# Patient Record
Sex: Female | Born: 1935 | Race: White | Hispanic: No | Smoking: Former smoker
Health system: Southern US, Community
[De-identification: ages and names within clinical notes are randomized; demographics above are authoritative.]

## PROBLEM LIST (undated history)

## (undated) DIAGNOSIS — M1990 Unspecified osteoarthritis, unspecified site: Secondary | ICD-10-CM

## (undated) DIAGNOSIS — B0229 Other postherpetic nervous system involvement: Secondary | ICD-10-CM

## (undated) HISTORY — DX: Other postherpetic nervous system involvement: B02.29

## (undated) HISTORY — DX: Unspecified osteoarthritis, unspecified site: M19.90

---

## 1978-02-27 HISTORY — PX: ABDOMINAL HYSTERECTOMY: SHX81

## 1998-10-15 ENCOUNTER — Other Ambulatory Visit: Admission: RE | Admit: 1998-10-15 | Discharge: 1998-10-15 | Payer: Self-pay | Admitting: Obstetrics and Gynecology

## 1999-11-14 ENCOUNTER — Other Ambulatory Visit: Admission: RE | Admit: 1999-11-14 | Discharge: 1999-11-14 | Payer: Self-pay | Admitting: Obstetrics and Gynecology

## 2000-12-18 ENCOUNTER — Other Ambulatory Visit: Admission: RE | Admit: 2000-12-18 | Discharge: 2000-12-18 | Payer: Self-pay | Admitting: Obstetrics and Gynecology

## 2003-09-17 ENCOUNTER — Ambulatory Visit (HOSPITAL_COMMUNITY): Admission: RE | Admit: 2003-09-17 | Discharge: 2003-09-17 | Payer: Self-pay | Admitting: Family Medicine

## 2011-04-17 ENCOUNTER — Ambulatory Visit (INDEPENDENT_AMBULATORY_CARE_PROVIDER_SITE_OTHER): Payer: Medicare Other | Admitting: Family Medicine

## 2011-04-17 ENCOUNTER — Encounter: Payer: Self-pay | Admitting: Family Medicine

## 2011-04-17 VITALS — BP 149/81 | HR 67 | Temp 98.6°F | Ht 67.0 in | Wt 170.5 lb

## 2011-04-17 DIAGNOSIS — B0229 Other postherpetic nervous system involvement: Secondary | ICD-10-CM

## 2011-04-17 DIAGNOSIS — Z299 Encounter for prophylactic measures, unspecified: Secondary | ICD-10-CM

## 2011-04-17 DIAGNOSIS — B351 Tinea unguium: Secondary | ICD-10-CM | POA: Insufficient documentation

## 2011-04-17 DIAGNOSIS — Z0000 Encounter for general adult medical examination without abnormal findings: Secondary | ICD-10-CM | POA: Insufficient documentation

## 2011-04-17 DIAGNOSIS — M722 Plantar fascial fibromatosis: Secondary | ICD-10-CM | POA: Insufficient documentation

## 2011-04-17 HISTORY — DX: Other postherpetic nervous system involvement: B02.29

## 2011-04-17 LAB — POCT SKIN KOH: Skin KOH, POC: NEGATIVE

## 2011-04-17 NOTE — Assessment & Plan Note (Signed)
Pt informed of resources, including Sports Medicine clinic.  Dr. Al Corpus to refill her pain meds.

## 2011-04-17 NOTE — Assessment & Plan Note (Signed)
Pt with continued pain and itching.  Would like zoster vaccine.  Rx given for pt to take to pharmacy.

## 2011-04-17 NOTE — Assessment & Plan Note (Signed)
Thickened nails, likely onychomycosis.  KOH negative in clinic.  Will send cx to inform treatment.  Pt very much wants treatment if this is fungal.

## 2011-04-17 NOTE — Assessment & Plan Note (Signed)
Will request recent lab work to determine if any labs need to be done.

## 2011-04-17 NOTE — Progress Notes (Signed)
Subjective:     Patient ID: Amy Whitehead, female   DOB: [birth year], 78 y.o.   MRN: [id number]  HPI Amy Whitehead is a 4 you female here to establish care. Her concerns today include:  Plantar fasciitis - followed by podiatrist and receives injections from him. Pain is over entire bottom of L foot.  Uses orthotic.  Has tried many meds in the past.  Current Regimen is Lyrica and Celebrex.  She is unsure why he put her on celebrex instead of other med.   Post herpetic neuralgia - had shingles 10-12 years ago, still with pain on L flank.  Pain is burning, "nerve" pain.  No relief with current meds.  Interested in getting shingles vaccine - has not had.  Thick toenails - would very much like to have nails trimmed.  If there is a fungus, she would like it treated.  PMH/FH/SH/PSH/meds updated Sees OB/GYN yearly, and last got a pap in 2012, mammogram 2012. Unsure Dexa, but in past few years.  Was told her bones are fine.   Review of Systems Denies SOB/Chest pain/urinary problems/constipation/diarrhea   Objective:   Physical Exam  Nursing note and vitals reviewed. Constitutional: She appears well-developed and well-nourished. No distress.  HENT:  Right Ear: External ear normal.  Left Ear: External ear normal.  Mouth/Throat: Oropharynx is clear and moist. No oropharyngeal exudate.       Upper and lower dentures  Eyes: Pupils are equal, round, and reactive to light. Right eye exhibits no discharge. Left eye exhibits no discharge. No scleral icterus.       S/p cataract surgery  Neck: Normal range of motion. Neck supple. No tracheal deviation present. No thyromegaly present.  Cardiovascular: Normal rate, regular rhythm and normal heart sounds.  Exam reveals no gallop and no friction rub.   No murmur heard. Pulmonary/Chest: Effort normal and breath sounds normal. No respiratory distress. She has no wheezes. She has no rales.  Musculoskeletal:       B feet with thickened nails. Minimal fat pads on feet.   No TTP or erythema.  DP2+ B.   Lymphadenopathy:    She has no cervical adenopathy.  Skin: Skin is warm and dry. No rash noted. She is not diaphoretic. No erythema.  Psychiatric: She has a normal mood and affect. Her behavior is normal. Judgment and thought content normal.     Assessment:     Plan:

## 2011-04-17 NOTE — Patient Instructions (Signed)
It was nice to meet you today. We will look at the toenail sample, and I will call you if we need to do anything about it. After I get the records from your other doctor, I will let you know if you are due for any lab work or tests.  You can take the prescription for the shingles vaccine to the pharmacy.  Let us know if you have any trouble with getting the medicines.

## 2011-05-16 LAB — CULTURE, FUNGUS WITHOUT SMEAR

## 2012-02-02 ENCOUNTER — Encounter: Payer: Self-pay | Admitting: Family Medicine

## 2012-02-02 ENCOUNTER — Ambulatory Visit (INDEPENDENT_AMBULATORY_CARE_PROVIDER_SITE_OTHER): Payer: Medicare Other | Admitting: Family Medicine

## 2012-02-02 VITALS — BP 144/72 | HR 62 | Temp 97.9°F | Ht 67.0 in | Wt 169.5 lb

## 2012-02-02 DIAGNOSIS — IMO0001 Reserved for inherently not codable concepts without codable children: Secondary | ICD-10-CM

## 2012-02-02 DIAGNOSIS — B0229 Other postherpetic nervous system involvement: Secondary | ICD-10-CM

## 2012-02-02 DIAGNOSIS — R030 Elevated blood-pressure reading, without diagnosis of hypertension: Secondary | ICD-10-CM

## 2012-02-02 LAB — LIPID PANEL
Cholesterol: 188 mg/dL (ref 0–200)
HDL: 50 mg/dL (ref >39)
LDL Cholesterol: 111 mg/dL — ABNORMAL HIGH (ref 0–99)
Total CHOL/HDL Ratio: 3.8 Ratio
Triglycerides: 136 mg/dL (ref <150)
VLDL: 27 mg/dL (ref 0–40)

## 2012-02-02 LAB — BASIC METABOLIC PANEL
BUN: 15 mg/dL (ref 6–23)
CO2: 31 mEq/L (ref 19–32)
Calcium: 9.8 mg/dL (ref 8.4–10.5)
Chloride: 104 mEq/L (ref 96–112)
Creat: 0.93 mg/dL (ref 0.50–1.10)
Glucose, Bld: 80 mg/dL (ref 70–99)
Potassium: 4.6 mEq/L (ref 3.5–5.3)
Sodium: 143 mEq/L (ref 135–145)

## 2012-02-02 LAB — CBC
HCT: 44.2 % (ref 36.0–46.0)
Hemoglobin: 15.1 g/dL — ABNORMAL HIGH (ref 12.0–15.0)
MCH: 33.3 pg (ref 26.0–34.0)
MCHC: 34.2 g/dL (ref 30.0–36.0)
MCV: 97.6 fL (ref 78.0–100.0)
Platelets: 190 10*3/uL (ref 150–400)
RBC: 4.53 MIL/uL (ref 3.87–5.11)
RDW: 14.8 % (ref 11.5–15.5)
WBC: 5.5 10*3/uL (ref 4.0–10.5)

## 2012-02-02 MED ORDER — LIDOCAINE 5 % EX OINT: TOPICAL_OINTMENT | CUTANEOUS | Status: DC | PRN

## 2012-02-02 NOTE — Patient Instructions (Addendum)
It has been a pleasure to see you today. I will call you with the labs results if they come back abnormal otherwise we will discuss them at your next appointment. Please keep a log of your blood pressure and bring it to your next appointment. Make an ppointment in 4-6 weeks.

## 2012-02-04 DIAGNOSIS — IMO0001 Reserved for inherently not codable concepts without codable children: Secondary | ICD-10-CM | POA: Insufficient documentation

## 2012-02-04 NOTE — Assessment & Plan Note (Signed)
Abnormal in two readings this year. Plan: BP log. Labs ordered since we don't have other labs on her chart to refer to.

## 2012-02-04 NOTE — Assessment & Plan Note (Signed)
Lidocaine cream. F/u as needed.

## 2012-02-04 NOTE — Progress Notes (Signed)
Family Medicine Office Visit Note   Subjective:   Patient ID: Amy Whitehead, female  DOB: May 23, 1933, 78 y.o.. MRN: [id number]   Pt that comes today to meet new doctor. Her only complaint today is her pot therpetic neuralgia that seems to act up more related to "weather changes". Pt uses only tylenol to relieve her symptoms but this is not helping at this time. Her Shingles were diagnosed in 2003.  Her BP today was 144/72 but pt denies symptoms. She reports being a little nervous coming to the doctor.   Review of Systems:  Pt denies SOB, chest pain, palpitations, headaches, dizziness, numbness or weakness. No changes on urinary or BM habits. No unintentional weigh loss/gain.  Objective:   Physical Exam: Gen:  NAD HEENT: Moist mucous membranes  CV: Regular rate and rhythm, no murmurs rubs or gallops PULM: Clear to auscultation bilaterally. No wheezes/rales/rhonchi ABD: Soft, non tender, non distended, normal bowel sounds EXT: No edema Neuro: Alert and oriented x3. No focalization  Assessment & Plan:

## 2012-07-17 ENCOUNTER — Encounter: Payer: Self-pay | Admitting: Family Medicine

## 2012-07-17 ENCOUNTER — Ambulatory Visit (INDEPENDENT_AMBULATORY_CARE_PROVIDER_SITE_OTHER): Payer: Medicare Other | Admitting: Family Medicine

## 2012-07-17 VITALS — BP 124/61 | HR 62 | Temp 98.3°F | Ht 67.0 in | Wt 158.0 lb

## 2012-07-17 DIAGNOSIS — Z299 Encounter for prophylactic measures, unspecified: Secondary | ICD-10-CM

## 2012-07-17 DIAGNOSIS — M722 Plantar fascial fibromatosis: Secondary | ICD-10-CM

## 2012-07-17 NOTE — Patient Instructions (Addendum)
It has been a pleasure to see you today. Please take the medications as prescribed. Make your next appointment in 6 months or sooner if needed.

## 2012-07-17 NOTE — Progress Notes (Signed)
Family Medicine Office Visit Note   Subjective:   Patient ID: Amy Whitehead, female  DOB: 1933-12-12, 79 y.o.. MRN: [id number]   Pt that comes today with no current complaints. She reports that now she lives alone with her dog. She use to take care of a friend that recently passes away. She is going through grieving but has gone though this in the past and feels she can handle it. She is focus on her dog and remember good times with her friend. Denies any suicidal ideation, anhedonia or trouble with eating or sleeping. She reports keeping herself active and enjoys walking.   HTN: Log showed BP on the range of 125-133/70-88.   Plantar fascitis: still present. She reports has had injections in the past. Custom insoles seems to help.   Review of Systems:  Pt denies SOB, chest pain, palpitations, headaches, dizziness, numbness or weakness. No changes on urinary or BM habits. No unintentional weigh loss/gain.  Objective:   Physical Exam: Gen:  NAD HEENT: Moist mucous membranes  CV: Regular rate and rhythm, no murmurs rubs or gallops PULM: Clear to auscultation bilaterally. No wheezes/rales/rhonchi ABD: Soft, non tender, non distended, normal bowel sounds EXT: No edema Neuro: Alert and oriented x3. No focalization  Assessment & Plan:

## 2012-07-18 NOTE — Assessment & Plan Note (Signed)
Exercises given 

## 2012-07-18 NOTE — Assessment & Plan Note (Addendum)
BP is wnl. Lipid profile has LDL 111. We calculated her ASCVD risk and is 50, but per age she does not fall  in the group that will benefit of statin therapy. Pt takes ASA and over the counter multivitamins and reports no side effects.

## 2012-12-02 ENCOUNTER — Other Ambulatory Visit: Payer: Self-pay | Admitting: *Deleted

## 2012-12-02 MED ORDER — CELECOXIB 200 MG PO CAPS: 200.0000 mg | ORAL_CAPSULE | Freq: Every day | ORAL | Status: DC

## 2012-12-11 ENCOUNTER — Other Ambulatory Visit: Payer: Self-pay | Admitting: Podiatry

## 2012-12-12 ENCOUNTER — Other Ambulatory Visit: Payer: Self-pay | Admitting: *Deleted

## 2012-12-12 MED ORDER — PREGABALIN 150 MG PO CAPS: 150.0000 mg | ORAL_CAPSULE | Freq: Three times a day (TID) | ORAL | Status: DC

## 2012-12-12 NOTE — Telephone Encounter (Signed)
Dr Al Corpus ordered Lyrica 150mg  #90 one capsule po tid 3 refills.

## 2012-12-12 NOTE — Telephone Encounter (Signed)
Pt contacted to pick up Lyrica rx,

## 2012-12-17 ENCOUNTER — Ambulatory Visit: Payer: Self-pay | Admitting: Podiatry

## 2012-12-24 ENCOUNTER — Ambulatory Visit (INDEPENDENT_AMBULATORY_CARE_PROVIDER_SITE_OTHER): Payer: Medicare Other | Admitting: Podiatry

## 2012-12-24 ENCOUNTER — Encounter: Payer: Self-pay | Admitting: Podiatry

## 2012-12-24 VITALS — BP 114/64 | HR 61 | Resp 16 | Ht 67.0 in | Wt 156.0 lb

## 2012-12-24 DIAGNOSIS — M79609 Pain in unspecified limb: Secondary | ICD-10-CM

## 2012-12-24 DIAGNOSIS — B351 Tinea unguium: Secondary | ICD-10-CM

## 2012-12-24 NOTE — Progress Notes (Signed)
Amy Whitehead presents today with a chief complaint of painful toenails bilateral she states that her left ankles doing much better.  Objective: Vital signs are stable she is alert and oriented x3 pulses remain palpable bilateral lower extremity. Hammertoe deformities demonstrate reactive hyperkeratosis to the distal aspect of the toes as well as thick yellow dystrophic clinically mycotic nails 1 through 5 bilaterally which are painful. On physical evaluation of her left ankle there is no pain on palpation and there is no pain on palpation of the subtalar joint either.  Assessment: Pain in limb secondary to onychomycosis hammertoe deformities and distal clavi.  Plan: Debridement of nails in thickness and length as a covered service. Followup with her in 3 months. Call sooner if needed.

## 2013-01-03 ENCOUNTER — Ambulatory Visit (INDEPENDENT_AMBULATORY_CARE_PROVIDER_SITE_OTHER): Payer: Medicare Other | Admitting: Family Medicine

## 2013-01-03 VITALS — BP 135/81 | HR 60 | Temp 98.1°F | Ht 67.0 in | Wt 157.0 lb

## 2013-01-03 DIAGNOSIS — Z299 Encounter for prophylactic measures, unspecified: Secondary | ICD-10-CM

## 2013-01-03 DIAGNOSIS — M722 Plantar fascial fibromatosis: Secondary | ICD-10-CM

## 2013-01-03 DIAGNOSIS — B0229 Other postherpetic nervous system involvement: Secondary | ICD-10-CM

## 2013-01-03 DIAGNOSIS — Z0000 Encounter for general adult medical examination without abnormal findings: Secondary | ICD-10-CM

## 2013-01-03 NOTE — Progress Notes (Signed)
Family Medicine Office Visit Note   Subjective:   Patient ID: Amy Whitehead, female  DOB: [birth year], 79 y.o.. MRN: [id number]   Pt that comes today for annual check up. She has not complaints other than her plantar fascitis she f/u with Dr. Al Corpus. Pt   takes ASA, Lyrica, vitamins and PRN NSAIDs.  Review of Systems:  Pt denies SOB, chest pain, palpitations, headaches, dizziness, numbness or weakness. No changes on urinary or BM habits. No unintentional weigh loss/gain.  Objective:   Physical Exam: Gen:  NAD HEENT: Moist mucous membranes  CV: Regular rate and rhythm, no murmurs rubs or gallops PULM: Clear to auscultation bilaterally. No wheezes/rales/rhonchi ABD: Soft, non tender, non distended, normal bowel sounds EXT: No edema Neuro: Alert and oriented x3. No focalization  Assessment & Plan:

## 2013-01-03 NOTE — Assessment & Plan Note (Signed)
CBC, BMET to monitor due to NSAID use.  Pap smear in 2015 Mammography done  3 months ago reported: No significant abnormality or change.  Continue ASA

## 2013-01-03 NOTE — Assessment & Plan Note (Signed)
Pt on Lyrica for this, not prescribed by [place].

## 2013-01-03 NOTE — Assessment & Plan Note (Signed)
Only mild symptoms continues to f/u with Dr. Al Corpus.

## 2013-01-03 NOTE — Patient Instructions (Addendum)
It has been a pleasure to see you today. Please take the medications as prescribed. Make an appointment for labs. I will call you with the labs results if they come back abnormal otherwise you will receive a letter. Follow up with [place] annually or sooner if needed.

## 2013-01-10 ENCOUNTER — Other Ambulatory Visit: Payer: Medicare Other

## 2013-01-10 DIAGNOSIS — Z0000 Encounter for general adult medical examination without abnormal findings: Secondary | ICD-10-CM

## 2013-01-10 LAB — BASIC METABOLIC PANEL
BUN: 15 mg/dL (ref 6–23)
CO2: 32 mEq/L (ref 19–32)
Calcium: 9.5 mg/dL (ref 8.4–10.5)
Chloride: 105 mEq/L (ref 96–112)
Creat: 0.85 mg/dL (ref 0.50–1.10)
Glucose, Bld: 82 mg/dL (ref 70–99)
Potassium: 4.4 mEq/L (ref 3.5–5.3)
Sodium: 142 mEq/L (ref 135–145)

## 2013-01-10 LAB — CBC
HCT: 46 % (ref 36.0–46.0)
Hemoglobin: 15.9 g/dL — ABNORMAL HIGH (ref 12.0–15.0)
MCH: 32.3 pg (ref 26.0–34.0)
MCHC: 34.6 g/dL (ref 30.0–36.0)
MCV: 93.5 fL (ref 78.0–100.0)
Platelets: 199 10*3/uL (ref 150–400)
RBC: 4.92 MIL/uL (ref 3.87–5.11)
RDW: 15.6 % — ABNORMAL HIGH (ref 11.5–15.5)
WBC: 6 10*3/uL (ref 4.0–10.5)

## 2013-01-10 NOTE — Progress Notes (Signed)
CBC, BMP DONE TODAY. MUHAMMAD KHUWAJA.

## 2013-01-16 ENCOUNTER — Encounter: Payer: Self-pay | Admitting: Family Medicine

## 2013-03-18 ENCOUNTER — Ambulatory Visit (INDEPENDENT_AMBULATORY_CARE_PROVIDER_SITE_OTHER): Payer: Medicare HMO | Admitting: Podiatry

## 2013-03-18 ENCOUNTER — Encounter: Payer: Self-pay | Admitting: Podiatry

## 2013-03-18 VITALS — BP 130/82 | HR 59 | Resp 14

## 2013-03-18 DIAGNOSIS — M79609 Pain in unspecified limb: Secondary | ICD-10-CM

## 2013-03-18 DIAGNOSIS — G5790 Unspecified mononeuropathy of unspecified lower limb: Secondary | ICD-10-CM

## 2013-03-18 DIAGNOSIS — M778 Other enthesopathies, not elsewhere classified: Secondary | ICD-10-CM

## 2013-03-18 DIAGNOSIS — M7750 Other enthesopathy of unspecified foot: Secondary | ICD-10-CM

## 2013-03-18 DIAGNOSIS — M779 Enthesopathy, unspecified: Principal | ICD-10-CM

## 2013-03-18 DIAGNOSIS — B351 Tinea unguium: Secondary | ICD-10-CM

## 2013-03-18 DIAGNOSIS — M1990 Unspecified osteoarthritis, unspecified site: Secondary | ICD-10-CM

## 2013-03-18 NOTE — Progress Notes (Signed)
No need to have my nails trimmed i had a pedicure, i would like him to give me a shot in this heel , also i would like him to give me a stronger strength in the lyrica .  Objective: Vital signs are stable she is alert and oriented x3. I explained to her that would be unable to get her stronger Lyrica since she is taking the maximum dose allowed. Her pulses are palpable bilateral. Her nails are thick yellow dystrophic clinically mycotic. He also painful on palpation as well as debridement. She has pain on palpation to the dorsal aspect of the left foot and the left heel.  Assessment: Pain in limb secondary to onychomycosis, neuropathy, plantar fasciitis and osteoarthritis. Capsulitis is also present first metatarsal medial cuneiform joint.  Plan: Discussed etiology pathology conservative versus surgical therapies. I suggested that she see her primary Dr. and have him/her refer her to a pain clinic. I also suggested an injection to the dorsal aspect of the left foot today. This was performed with 20 mg of Kenalog. I also debrided her toenails one through 5 bilateral is cover service secondary to pain I will followup with her couple of months.

## 2013-03-28 ENCOUNTER — Telehealth: Payer: Self-pay | Admitting: Family Medicine

## 2013-04-02 ENCOUNTER — Other Ambulatory Visit: Payer: Self-pay | Admitting: Podiatry

## 2013-04-02 NOTE — Telephone Encounter (Signed)
That will be fine thank you

## 2013-04-02 NOTE — Telephone Encounter (Signed)
Dr Al CorpusHyatt,  Do you want this to be refill?  It appears she has been on it since 2013.  Please advise.  Thanks,   Air cabin crewvalery

## 2013-04-03 MED ORDER — CELECOXIB 200 MG PO CAPS: 200.0000 mg | ORAL_CAPSULE | Freq: Every day | ORAL | Status: DC

## 2013-04-12 ENCOUNTER — Other Ambulatory Visit: Payer: Self-pay | Admitting: Podiatry

## 2013-04-16 ENCOUNTER — Other Ambulatory Visit: Payer: Self-pay | Admitting: Podiatry

## 2013-04-16 NOTE — Telephone Encounter (Signed)
Refilled prescription but unable to send in electronically. Called pt to let her know she could pick up this Rx at anytime that is was ready.

## 2013-05-01 ENCOUNTER — Encounter: Payer: Self-pay | Admitting: Family Medicine

## 2013-05-01 ENCOUNTER — Ambulatory Visit (INDEPENDENT_AMBULATORY_CARE_PROVIDER_SITE_OTHER): Payer: Commercial Managed Care - HMO | Admitting: Family Medicine

## 2013-05-01 VITALS — BP 145/91 | HR 112 | Temp 97.8°F | Ht 67.0 in | Wt 157.0 lb

## 2013-05-01 DIAGNOSIS — B0229 Other postherpetic nervous system involvement: Secondary | ICD-10-CM

## 2013-05-01 MED ORDER — CELECOXIB 200 MG PO CAPS: 200.0000 mg | ORAL_CAPSULE | Freq: Every day | ORAL | Status: DC

## 2013-05-01 MED ORDER — AMITRIPTYLINE HCL 10 MG PO TABS: 10.0000 mg | ORAL_TABLET | Freq: Every day | ORAL | Status: DC

## 2013-05-01 NOTE — Progress Notes (Signed)
Family Medicine Office Visit Note   Subjective:   Patient ID: Amy Whitehead, female  DOB: 02/23/1934, 80 y.o.. MRN: [id number]   Pt that comes today complaining of left leg pain. She reports has been diagnosed with plantar fascitis and has gone under treatment including steroid injections at [facility] with Dr. Al CorpusHyatt without much relieve of her symptoms. Pt reports all started after her Shingles. Her pain is described to be starting on left lateral aspect of ribs and goes down from there to her left foot. Pain is burning in nature and denies weakness, back pain or other focal symptoms.   Review of Systems:  Pt denies fever, chills, SOB, chest pain, palpitations, headaches, dizziness, numbness or weakness. No changes on urinary or BM habits. No unintentional weigh loss/gain.  Objective:   Physical Exam: Gen:  NAD HEENT: Moist mucous membranes  CV: Regular rate and rhythm, no murmurs rubs or gallops PULM: Clear to auscultation bilaterally. No wheezes/rales/rhonchi ABD: Soft, non tender, non distended, normal bowel sounds EXT: No edema, no erythema. Straight leg raise is negative bilaterally.  Neuro: Alert and oriented x3. No focalization. Normal rand symmetric reflexes   Assessment & Plan:

## 2013-05-01 NOTE — Patient Instructions (Addendum)
Start taking the new medication called Amitriptyline. Start with half a tablet at night time for three days then, if  no side effects noted you can increase to 1 tablet every night. Follow up with me in 2 weeks.

## 2013-05-01 NOTE — Assessment & Plan Note (Signed)
On Lyrica and Celebrex without much relieve. Has tried local capsaicin with burning as side effect. Discussed start low dose Tricyclic Antidepressant and monitor response, Pt was informed of side effects profile and was agreeable with plan. Will start Amitriptyline 10 mg QHS and f/u in 2 weeks.

## 2013-05-09 ENCOUNTER — Telehealth: Payer: Self-pay | Admitting: Family Medicine

## 2013-05-09 ENCOUNTER — Other Ambulatory Visit: Payer: Self-pay | Admitting: Family Medicine

## 2013-05-09 MED ORDER — PREGABALIN 150 MG PO CAPS: ORAL_CAPSULE | ORAL | Status: DC

## 2013-05-09 NOTE — Telephone Encounter (Signed)
Pt came in stating that she needs her prescription for Lyrica written out and she'll come pick it up.

## 2013-05-09 NOTE — Telephone Encounter (Signed)
PCP is [facility] Outside office changed PCP on 1/20 for unknown reasons i changed back Will forward to Goldman SachsPil[facility]

## 2013-05-09 NOTE — Telephone Encounter (Signed)
Pt might need an appt, it doesn't look like she has seen her PCP. Cali Hope,CMA

## 2013-05-09 NOTE — Telephone Encounter (Signed)
Prescription printed and placed to be picked.

## 2013-05-15 ENCOUNTER — Ambulatory Visit (INDEPENDENT_AMBULATORY_CARE_PROVIDER_SITE_OTHER): Payer: Commercial Managed Care - HMO | Admitting: Family Medicine

## 2013-05-15 ENCOUNTER — Telehealth: Payer: Self-pay | Admitting: Family Medicine

## 2013-05-15 ENCOUNTER — Encounter: Payer: Self-pay | Admitting: Family Medicine

## 2013-05-15 VITALS — BP 131/61 | HR 58 | Temp 97.9°F | Ht 67.0 in | Wt 159.0 lb

## 2013-05-15 DIAGNOSIS — B0229 Other postherpetic nervous system involvement: Secondary | ICD-10-CM

## 2013-05-15 DIAGNOSIS — Z23 Encounter for immunization: Secondary | ICD-10-CM

## 2013-05-15 NOTE — Assessment & Plan Note (Signed)
Continue Amitriptyline, only 10 mg since it seem to be working for her and due to age and side effect profile of medication.  Continue Lyrica Pt can take Celebrex every other day and then stop.  F/u as needed

## 2013-05-15 NOTE — Patient Instructions (Signed)
No change in dose of Amitriptyline at this time.  You can taper Celebrex and then discontinue it. Follow up as needed

## 2013-05-15 NOTE — Telephone Encounter (Signed)
Pt called and wanted to let Dr. Aviva SignsPiloto know that they both forgot about the prescription for Lyrica. Can you leave it up front and she will pick up tomorrow. jw

## 2013-05-15 NOTE — Progress Notes (Signed)
Family Medicine Office Visit Note   Subjective:   Patient ID: Elaina PatteeMary P Leaman, female  DOB: 01/07/1934, 80 y.o.. MRN: [id number]   Pt that comes today for follow up recent visit for postherpetic neuralgia symptoms. She was started on amitriptyline 10 mg and reports no noticeable side effects beside bad tasting pill. She reports improvement of her symptoms and wishes to continue on this medication.  She also has been taking Celebrex and desires to stop this medication.  Review of Systems:  Pt denies SOB, chest pain, palpitations, headaches, dizziness, numbness or weakness. No changes on urinary or BM habits. No unintentional weigh loss/gain.  Objective:   Physical Exam: Gen:  NAD HEENT: Moist mucous membranes  CV: Regular rate and rhythm, no murmurs rubs or gallops PULM: Clear to auscultation bilaterally. No wheezes/rales/rhonchi ABD: Soft, non tender, non distended, normal bowel sounds EXT: No edema Neuro: Alert and oriented x3. No focalization Skin: no rashes  Assessment & Plan:

## 2013-05-15 NOTE — Telephone Encounter (Signed)
Please advise. Amy Whitehead  

## 2013-05-21 ENCOUNTER — Telehealth: Payer: Self-pay | Admitting: Family Medicine

## 2013-05-21 ENCOUNTER — Other Ambulatory Visit: Payer: Self-pay | Admitting: *Deleted

## 2013-05-21 DIAGNOSIS — B0229 Other postherpetic nervous system involvement: Secondary | ICD-10-CM

## 2013-05-21 MED ORDER — PREGABALIN 150 MG PO CAPS: ORAL_CAPSULE | ORAL | Status: DC

## 2013-05-21 NOTE — Telephone Encounter (Signed)
Pt called to check the status of her refill request on Lyrica. It says print but it is not up front, can someone put it up front please and call her to let her know. jw

## 2013-05-21 NOTE — Telephone Encounter (Signed)
Dr Pilot please print Rx,I will place in up front for patient.Thank you.Burlon Centrella, Virgel BouquetGiovanna S

## 2013-05-21 NOTE — Telephone Encounter (Signed)
Printed a new Rx for patient's Lyrica. The original Rx was placed up front by her PCP and could not be located. Patient is on her way to pick up rx.  Rx printed and given to clinic saff, [name] to place up front for pick up.

## 2013-05-29 NOTE — Telephone Encounter (Signed)
I did not forget. Medication has been printed and placed in the front for her to pick up under her name.

## 2013-06-02 NOTE — Telephone Encounter (Signed)
Please advise.Thank you.Amy Whitehead  

## 2013-06-02 NOTE — Telephone Encounter (Signed)
Please call Amy Whitehead back.  Have been tapering back on her Celebrex and had a mishap at church yesterday where she almost fell.  Wanted to know if that could be a problem tapering off the Celebrex.

## 2013-06-09 NOTE — Telephone Encounter (Signed)
She can go back  To her usual dose if pain is not controlled.

## 2013-06-09 NOTE — Telephone Encounter (Signed)
Pt called and was told Dr. Willaim RayasPiloto's message but she would like Dr. Aviva SignsPiloto to call her and discuss further. jw

## 2013-06-17 ENCOUNTER — Other Ambulatory Visit: Payer: Self-pay | Admitting: Family Medicine

## 2013-06-17 ENCOUNTER — Ambulatory Visit: Payer: Medicare HMO | Admitting: Podiatry

## 2013-06-17 DIAGNOSIS — B0229 Other postherpetic nervous system involvement: Secondary | ICD-10-CM

## 2013-06-17 MED ORDER — PREGABALIN 150 MG PO CAPS: ORAL_CAPSULE | ORAL | Status: DC

## 2013-06-17 NOTE — Telephone Encounter (Signed)
Called pt back, she only was concerned that at church she felt mild numbness on her R foot after having it crossed. She reports this improved in minutes completely and she denies any other symptoms. No further recommendations were given.

## 2013-06-17 NOTE — Telephone Encounter (Signed)
Patient states she spoke to Dr Aviva SignsPiloto and that pharmacist told her that.Lyrica needs to be printed.Informed patient that I would be working with Dr Aviva SignsPiloto this afternoon and that I would ask Dr Aviva SignsPiloto to print and once done I would place it upfront for pick.I also told her I would try  Having this done by end of day.she voiced great appreciation.Mantaj Chamberlin S Iraida Cragin Dr Aviva SignsPiloto please print RX for me to place upfront.thank you

## 2013-07-14 ENCOUNTER — Telehealth: Payer: Self-pay | Admitting: Family Medicine

## 2013-07-14 NOTE — Telephone Encounter (Signed)
Saw article in paper about foot pain. It was about neuopathy. In todays paper section A  She wants to know if she should go? Please advise

## 2013-07-17 NOTE — Telephone Encounter (Signed)
I am not familiar with the information provided. No medical recommendations are given regarding this.

## 2013-07-28 ENCOUNTER — Ambulatory Visit (INDEPENDENT_AMBULATORY_CARE_PROVIDER_SITE_OTHER): Payer: Commercial Managed Care - HMO | Admitting: Family Medicine

## 2013-07-28 ENCOUNTER — Encounter: Payer: Self-pay | Admitting: Family Medicine

## 2013-07-28 VITALS — BP 121/78 | HR 67 | Temp 98.3°F | Ht 67.0 in | Wt 156.0 lb

## 2013-07-28 DIAGNOSIS — M722 Plantar fascial fibromatosis: Secondary | ICD-10-CM

## 2013-07-28 DIAGNOSIS — B0229 Other postherpetic nervous system involvement: Secondary | ICD-10-CM

## 2013-07-28 MED ORDER — AMITRIPTYLINE HCL 10 MG PO TABS: 10.0000 mg | ORAL_TABLET | Freq: Every day | ORAL | Status: DC

## 2013-07-28 MED ORDER — PREGABALIN 150 MG PO CAPS: ORAL_CAPSULE | ORAL | Status: DC

## 2013-07-28 NOTE — Patient Instructions (Addendum)
No changes in your regimen plan. Take medication as prescribed and f/u as needed

## 2013-07-28 NOTE — Progress Notes (Signed)
Family Medicine Office Visit Note   Subjective:   Patient ID: Amy Whitehead, female  DOB: 1933/09/06, 80 y.o.. MRN: [id number]   Pt that comes today to discuss a newspaper add she saw and would like to consider for treatment of her post-herpetic neuralgia. The add is regarding a quiropractor and rehabilitation independent practice that offers treatment for neuropathic pain without medications, injections or other invasive procedures, but does not explains further.  Pt is taking Lyrica and reports compliance, also Amitriptyline but in this case she reports inconsistency with treatment. She reports her pain is mostly located on her left side where her shingles were and attributes her L foot pain to the same condition even though she never got lesions below the affected dermatome.  She has been treated for plantar fascitis by her Podiatrist with steroid injections with no improvement of her symptoms and she does not agree with this diagnosis. Pain is reported to be burning in nature, located on plantar surface of her L foot without radiation. Worse when she walks. Pain intensity is 3/10 and is intermittent. Pt reports days of absolute no pain.   Review of Systems:  Per HPI  Objective:   Physical Exam: Gen:  NAD HEENT: Moist mucous membranes  CV: Regular rate and rhythm, no murmurs rubs or gallops PULM: Clear to auscultation bilaterally. No wheezes/rales/rhonchi ABD: Soft, non tender, non distended, normal bowel sounds EXT: No edema or erythema. Tenderness located on plantar surface of left foot. No lesions present. Neurovascular intact. No joint involvement.  Neuro: Alert and oriented x3. No focalization.   Assessment & Plan:

## 2013-07-29 NOTE — Assessment & Plan Note (Signed)
Declines to continue with Podiatry since she does not agree with this diagnosis. Exam is consistent with plantar fascitis but her history of pain does not. We discussed treatment options including her concerns about newspaper add. I explain I don't know what are the specifics of treatment offered by this quiropractor but would like to know further. Pt was instructed to call and obtain further information in order to determine validity of treatment and risk/ benefits she may be exposed to case she would like to pursue this option.

## 2013-07-29 NOTE — Assessment & Plan Note (Signed)
Continue with Lyrica and Amitriptyline and encourage compliance w/ Pamelor in order to determine if this works for her or not. I would prefer not to have he on this medication unless absolutely necessary. F/u as needed.

## 2013-08-11 ENCOUNTER — Telehealth: Payer: Self-pay | Admitting: Family Medicine

## 2013-08-11 NOTE — Telephone Encounter (Signed)
lyrica and celebrex are not working, Wondering if MRI or CT scan is needed Would like to talk to dr Aviva Signspiloto about this

## 2013-08-12 ENCOUNTER — Other Ambulatory Visit: Payer: Self-pay | Admitting: Family Medicine

## 2013-08-12 DIAGNOSIS — B0229 Other postherpetic nervous system involvement: Secondary | ICD-10-CM

## 2013-08-12 NOTE — Telephone Encounter (Signed)
No imaging is needed at this time. I will place a referral for Pain Clinic. Please let pt know. Thank you.

## 2013-08-12 NOTE — Telephone Encounter (Signed)
Spoke with patient and she wants to please talk to doctor pertaining to this.

## 2013-08-14 NOTE — Telephone Encounter (Signed)
Will await pt/MD discussion before I process referral. . Fleeger, Maryjo RochesterJessica Dawn

## 2013-08-25 ENCOUNTER — Telehealth: Payer: Self-pay | Admitting: Family Medicine

## 2013-08-25 NOTE — Telephone Encounter (Signed)
Pt called and wanted and would like someone to call her concerning her shingles from the past. Her nerves are bothering her and she does not want to see pain doctor or a chiropractor. She would like to dermatologist. This is where she thinks would be the best help for her. jw

## 2013-08-26 NOTE — Telephone Encounter (Signed)
Pt called again about calling in something for shingles. Please return her call

## 2013-08-26 NOTE — Telephone Encounter (Signed)
Please have here schedule an appointment with me or another provider.  Thanks!

## 2013-08-26 NOTE — Telephone Encounter (Signed)
FWD to patient's new PCP

## 2013-08-27 NOTE — Telephone Encounter (Signed)
Has been scheduled for Monday 7/6 @ 11am with Dr. Caroleen Hammanumley

## 2013-09-01 ENCOUNTER — Ambulatory Visit (INDEPENDENT_AMBULATORY_CARE_PROVIDER_SITE_OTHER): Payer: Commercial Managed Care - HMO | Admitting: Family Medicine

## 2013-09-01 ENCOUNTER — Encounter: Payer: Self-pay | Admitting: Family Medicine

## 2013-09-01 VITALS — BP 110/70 | HR 60 | Temp 98.4°F | Ht 67.0 in | Wt 154.0 lb

## 2013-09-01 DIAGNOSIS — B0229 Other postherpetic nervous system involvement: Secondary | ICD-10-CM

## 2013-09-01 MED ORDER — LIDOCAINE 5 % EX OINT: TOPICAL_OINTMENT | CUTANEOUS | Status: DC | PRN

## 2013-09-01 MED ORDER — PREGABALIN 150 MG PO CAPS: ORAL_CAPSULE | ORAL | Status: DC

## 2013-09-01 NOTE — Assessment & Plan Note (Addendum)
Lidocaine Cream 5% and Lyrica have been refilled.  Tapering off of amitriptyline because it does not appear to be helping and its use is cautioned in elderly:  Take it every other day for 5 days, then every third day for 5 days, and then stop taking the medication.  Wrote out which days to take medication on paper to ensure it was clear with patient. Referred patient to pain clinic. Patient stated she understood the plan.  Instructed patient that if notes any new or concerning symptoms during the taper to contact me.

## 2013-09-01 NOTE — Patient Instructions (Signed)
Lidocaine 5% Cream has been refilled and should be at your pharmacy.  Please use it as needed.   We are going to attempt to stop taking your Amitriptyline, but we have to taper it slowely.  Please take the amitriptyline every other day for 5 days, then every third day for 5 days, and then stop it all together.    Thank you so much for coming to visit today!

## 2013-09-01 NOTE — Progress Notes (Addendum)
Patient ID: Amy Whitehead, female   DOB: 03/15/1933, 80 y.o.   MRN: [id number] Patient ID: Amy PatteeMary P Whitehead, female   DOB: 12/20/1933, 80 y.o.   MRN: [id number]  Mrs. Amy Whitehead is a 80yo Caucasian female presenting today for continued pain from her post-herpetic neuralgia.  She had shingles in 2003 and continues to have burning pain down her left mid-axillary line down into her left leg.  States that pain affects whole leg and not one side or the other.  Had been diagnosed with plantar fasciitis in the past, but she believes that the pain is due to her nerve pain; she does not have significant pain in the morning that is usually associated with plantar fasciitis.  She had a referral to pain in the past, but did not like the whispering tone of the contact from the pain clinic, so she did not follow through.  She would like another referral at this time.  She currently takes Amitriptyline and Lyrica for the pain but notes no difference.  She is listed as taking Lidocaine cream, but she does not currently take it and doesn't remember taking it in the past.  Is interested in trying Lidocaine cream today.  She has had the Shingles vaccine in the past.  ROS:  Denies urinary retention, dry mouth, and constipation.  PE: General:  Well nourished 80yo female in no apparent distress Cardiac:  S1 and S2 noted; no murmurs, rubs, or gallops Resp:  Clear bilaterally; no wheezing, rales, or rhonchi Neuro: No sensory loss noted Spine:  No midline tenderness Extremities:  Mild tenderness over left foot and plantar fascia  A/P:  Please see problem list for assessment and plan.

## 2013-09-23 ENCOUNTER — Encounter: Payer: Self-pay | Admitting: Family Medicine

## 2013-10-03 ENCOUNTER — Telehealth: Payer: Self-pay | Admitting: Family Medicine

## 2013-10-03 DIAGNOSIS — B0229 Other postherpetic nervous system involvement: Secondary | ICD-10-CM

## 2013-10-03 MED ORDER — PREGABALIN 150 MG PO CAPS: ORAL_CAPSULE | ORAL | Status: DC

## 2013-10-03 NOTE — Telephone Encounter (Signed)
Spoke with patient and informed her of below 

## 2013-10-03 NOTE — Telephone Encounter (Signed)
Prescription filled and left at front!  Thanks for your help!

## 2013-10-03 NOTE — Telephone Encounter (Signed)
Pt called and would like a refill on her Lyrica left up front for pick up. jw

## 2013-10-28 ENCOUNTER — Telehealth: Payer: Self-pay | Admitting: Family Medicine

## 2013-10-28 DIAGNOSIS — B0229 Other postherpetic nervous system involvement: Secondary | ICD-10-CM

## 2013-10-28 NOTE — Telephone Encounter (Signed)
Refill request for Lyrica. Pls leave up front for pick up.

## 2013-10-29 MED ORDER — PREGABALIN 150 MG PO CAPS: ORAL_CAPSULE | ORAL | Status: DC

## 2013-10-29 NOTE — Telephone Encounter (Signed)
Left message on voicemail. Amy Whitehead S  

## 2013-10-29 NOTE — Telephone Encounter (Signed)
Refilled Lyrica and left prescription out front.  Please call and let Amy Whitehead know it is waiting for her.

## 2013-11-28 ENCOUNTER — Ambulatory Visit (INDEPENDENT_AMBULATORY_CARE_PROVIDER_SITE_OTHER): Payer: Commercial Managed Care - HMO | Admitting: *Deleted

## 2013-11-28 DIAGNOSIS — Z23 Encounter for immunization: Secondary | ICD-10-CM

## 2013-12-03 ENCOUNTER — Encounter: Payer: Self-pay | Admitting: Family Medicine

## 2013-12-03 DIAGNOSIS — H269 Unspecified cataract: Secondary | ICD-10-CM

## 2013-12-03 NOTE — Progress Notes (Signed)
Patient is needing a referral to [facility] to have a procedure done. (Capsulotomy)  They have already scheduled the appt for Nov. 2.

## 2013-12-03 NOTE — Progress Notes (Signed)
Patient ID: Amy Whitehead, female   DOB: 10/24/1933, 80 y.o.   MRN: [id number] Referral was sent to Ophthalmology.

## 2013-12-04 ENCOUNTER — Other Ambulatory Visit: Payer: Self-pay | Admitting: Family Medicine

## 2013-12-04 DIAGNOSIS — B0229 Other postherpetic nervous system involvement: Secondary | ICD-10-CM

## 2013-12-04 MED ORDER — PREGABALIN 150 MG PO CAPS: ORAL_CAPSULE | ORAL | Status: DC

## 2013-12-04 NOTE — Telephone Encounter (Signed)
Needs refill on lyrcia Would like to pick up tomorrow

## 2013-12-05 ENCOUNTER — Other Ambulatory Visit: Payer: Self-pay | Admitting: Family Medicine

## 2013-12-05 DIAGNOSIS — B0229 Other postherpetic nervous system involvement: Secondary | ICD-10-CM

## 2013-12-05 MED ORDER — PREGABALIN 150 MG PO CAPS: ORAL_CAPSULE | ORAL | Status: DC

## 2013-12-15 ENCOUNTER — Encounter: Payer: Self-pay | Admitting: Family Medicine

## 2013-12-15 ENCOUNTER — Telehealth: Payer: Self-pay | Admitting: Family Medicine

## 2013-12-15 NOTE — Progress Notes (Signed)
Pt comes in to office, states the [facility] will need additional visits in order to see pt again. Pt requesting to be called once additional visits are approved so she can call to make an appt with [facility]. Pls advise. [facility] phone [phone], fax [phone].

## 2013-12-15 NOTE — Telephone Encounter (Signed)
Please see other phone message. Amy Whitehead, Amy Whitehead

## 2013-12-15 NOTE — Progress Notes (Signed)
LVM for patient to call back. Just want to know what doctor she sees over there so I can out that [facility].

## 2013-12-15 NOTE — Telephone Encounter (Signed)
Pt calling re: referral (see previous documentation), says she was told to call us to let us know what doctor she sees at [facility], pt says she brought in a card that someone at the front desk made a copy of (should be in MD box). Pt says she sees a Radio broadcast assistantdoctor Harkins. Pt requesting call once done so she can call and make another appt.

## 2013-12-15 NOTE — Progress Notes (Signed)
[facility]  Auth # [id number].  Dr. Ollen BowlHarkins nurse informed and she will call patient. Fleeger, Maryjo RochesterJessica Dawn

## 2014-01-02 ENCOUNTER — Other Ambulatory Visit: Payer: Self-pay | Admitting: Family Medicine

## 2014-01-02 DIAGNOSIS — B0229 Other postherpetic nervous system involvement: Secondary | ICD-10-CM

## 2014-01-02 MED ORDER — PREGABALIN 150 MG PO CAPS: ORAL_CAPSULE | ORAL | Status: DC

## 2014-01-02 NOTE — Telephone Encounter (Signed)
Pt called and would like refill on her Lyrica left up front for pick up. Please call patient when ready. j w

## 2014-01-05 ENCOUNTER — Other Ambulatory Visit: Payer: Self-pay | Admitting: Family Medicine

## 2014-01-05 DIAGNOSIS — B0229 Other postherpetic nervous system involvement: Secondary | ICD-10-CM

## 2014-01-05 MED ORDER — PREGABALIN 150 MG PO CAPS: ORAL_CAPSULE | ORAL | Status: DC

## 2014-01-05 NOTE — Telephone Encounter (Signed)
The prescription has been printed, has it been left up front for pick up

## 2014-02-02 ENCOUNTER — Other Ambulatory Visit: Payer: Self-pay | Admitting: Family Medicine

## 2014-02-02 DIAGNOSIS — B0229 Other postherpetic nervous system involvement: Secondary | ICD-10-CM

## 2014-02-02 MED ORDER — PREGABALIN 150 MG PO CAPS: ORAL_CAPSULE | ORAL | Status: DC

## 2014-02-02 NOTE — Telephone Encounter (Signed)
Pt called because she has 1 week left on her Lyrica and will pick this up next week. jw

## 2014-03-05 ENCOUNTER — Other Ambulatory Visit: Payer: Self-pay | Admitting: Family Medicine

## 2014-03-05 DIAGNOSIS — B0229 Other postherpetic nervous system involvement: Secondary | ICD-10-CM

## 2014-03-05 MED ORDER — PREGABALIN 150 MG PO CAPS: ORAL_CAPSULE | ORAL | Status: DC

## 2014-03-05 NOTE — Telephone Encounter (Signed)
Pt called and needs to get a refill on her Lyrica. Please call when this is ready for pick up. jw

## 2014-03-06 NOTE — Telephone Encounter (Signed)
Spoke with pt and informed her that her Rx is at front office for her to pick up. Amy Whitehead, Amy Whitehead D

## 2014-03-09 ENCOUNTER — Telehealth: Payer: Self-pay | Admitting: Family Medicine

## 2014-03-09 NOTE — Telephone Encounter (Signed)
Asking to speak with MD re: her lyrica medication. Says it is costing her $397 out of pocket every 3 months and she cannot afford that. Has enough for the next 3 months but needs an alternate medication when she runs out.

## 2014-03-24 NOTE — Telephone Encounter (Signed)
Pt is calling back and would like to know if there is an alternative to Lyrica that is similar but cheaper. She can not keep pay 400.00 every 3 months . Please call patient to advise because he pharmacist said that there some other medications that she can take. jw

## 2014-03-30 NOTE — Telephone Encounter (Signed)
Pt is calling again. She really needs for someone to help her figure what she is suppose to do or what alternated there is to Lyrica. She said that the pharmacy said that there were other options. Please call patient so she knows what to do. jw

## 2014-03-31 MED ORDER — GABAPENTIN 300 MG PO CAPS: ORAL_CAPSULE | ORAL | Status: DC

## 2014-03-31 NOTE — Telephone Encounter (Signed)
[employer] drug list placed in provider box along with prior authorization form.  There is a quantity limit of 90 capsules per 30 days.  Lyrica 150 mg is covered under pt's insurance, but please review the quantity prescribed.  If a prior authorization form is completed please return to [place], Charity fundraiserN once completed.  Clovis PuMartin, Tamika L, RN

## 2014-03-31 NOTE — Telephone Encounter (Signed)
Pt wants more explanation of how to taper off Lyrica and start Gabapentin Please advise

## 2014-03-31 NOTE — Telephone Encounter (Signed)
Contacted Amy Whitehead on 1/27 and discussed transition from Lyrica to [place]. Plan to taper off of Lyrica over one week. States she has plenty of Lyrica left. Instructed to finish current bottle of Lyrica and we will then transition to Gabapentin. Currently taking Lyrica 150mg  TID. Will

## 2014-03-31 NOTE — Telephone Encounter (Signed)
Called Amy Whitehead back to discuss tapering of medication. Discussed with pharmacy and if no history of seizures there is no need to taper off of medication. Clarified with Amy Whitehead that she has no history of seizures.  Printed prescription for Gabapentin 300mg  [facility] and left at front desk for Amy Whitehead to pick up. Discussed that she will need to take one tablet for one day, one tablet two times a day on the second day, and then one tablet three times a day from the third day on until further discussed with physician. Expressed understanding in plan.

## 2014-04-29 ENCOUNTER — Telehealth: Payer: Self-pay | Admitting: Family Medicine

## 2014-04-29 MED ORDER — GABAPENTIN 300 MG PO CAPS: ORAL_CAPSULE | ORAL | Status: DC

## 2014-04-29 NOTE — Telephone Encounter (Signed)
Patient calls, recently prescribed Gabapentin. Patient would like to know if she will need to be seen by Dr. Caroleen Hammanumley before she gets a refill. Please let patient know.

## 2014-05-04 ENCOUNTER — Other Ambulatory Visit: Payer: Self-pay | Admitting: Family Medicine

## 2014-05-04 MED ORDER — GABAPENTIN 300 MG PO CAPS: ORAL_CAPSULE | ORAL | Status: DC

## 2014-05-04 NOTE — Telephone Encounter (Signed)
Pt called back about her gabapentin. She doesn't know if she will be able to get a refill on it . She runs out this week She called about a refill on March 2

## 2014-05-05 ENCOUNTER — Telehealth: Payer: Self-pay | Admitting: Family Medicine

## 2014-05-05 NOTE — Telephone Encounter (Signed)
Pt says her feet are very tender on the botton. They are not swollen. They dont bother her when she goes to bed Please advise

## 2014-05-05 NOTE — Telephone Encounter (Signed)
Pt would like to talk to dr Elizebeth Kollerrumbley

## 2014-05-05 NOTE — Telephone Encounter (Signed)
Please have schedule same day appointment to be evaluated.

## 2014-06-03 ENCOUNTER — Encounter: Payer: Self-pay | Admitting: Family Medicine

## 2014-06-03 ENCOUNTER — Ambulatory Visit (INDEPENDENT_AMBULATORY_CARE_PROVIDER_SITE_OTHER): Payer: Commercial Managed Care - HMO | Admitting: Family Medicine

## 2014-06-03 VITALS — BP 120/68 | HR 66 | Temp 97.7°F | Ht 67.0 in | Wt 152.0 lb

## 2014-06-03 DIAGNOSIS — B0229 Other postherpetic nervous system involvement: Secondary | ICD-10-CM

## 2014-06-03 MED ORDER — GABAPENTIN 300 MG PO CAPS: 600.0000 mg | ORAL_CAPSULE | Freq: Three times a day (TID) | ORAL | Status: DC

## 2014-06-03 NOTE — Patient Instructions (Signed)
Thank you so much for coming to visit me today! We will increase you Gabapentin from 300mg  three times a day to 600mg  three times a day. Please let me know if this helps with your leg pain. If it does not make a difference, we will consider referring you to vascular or podiatry!    Thanks again and please let me know if there's anything else we can do for you! Dr. Caroleen Hammanumley  Postherpetic Neuralgia Postherpetic neuralgia (PHN) is nerve pain that occurs after a shingles infection. Shingles is a painful rash that appears on one side of the body, usually on your trunk or face. Shingles is caused by the varicella-zoster virus. This is the same virus that causes chickenpox. In people who have had chickenpox, the virus can resurface years later and cause shingles. You may have PHN if you continue to have pain for 3 months after your shingles rash has gone away. PHN appears in the same area where you had the shingles rash. For most people, PHN goes away within 1 year.  Getting a vaccination for shingles can prevent PHN. This vaccine is recommended for people older than 50. It may prevent shingles and may also lower your risk of PHN if you do get shingles. CAUSES PHN is caused by damage to your nerves from the varicella-zoster virus. This damage makes your nerves overly sensitive.  RISK FACTORS Aging is the biggest risk factor for developing PHN. Most people who get PHN are older than 60. Other risk factors include:  Having very bad pain before your shingles rash starts.  Having a very bad rash.  Having shingles in the nerve that supplies your face and eye (trigeminal nerve). SIGNS AND SYMPTOMS Pain is the main symptom of PHN. The pain is often very bad and may be described as stabbing, burning, or feeling like an electric shock. The pain may come and go or may be there all the time. Pain may be triggered by light touches on the skin or changes in temperature. You may have itching along with the  pain. DIAGNOSIS  Your health care provider may diagnose PHN based on your symptoms and your history of shingles. Lab studies and other diagnostic tests are usually not needed. TREATMENT  There is no cure for PHN. Treatment for PHN will focus on pain relief. Over-the-counter pain relievers do not usually relieve PHN pain. You may need to work with a pain specialist. Treatment may include:  Antidepressant medicines to help with pain and improve sleep.  Antiseizure medicines to relieve nerve pain.  Strong pain relievers (opioids).  A numbing patch worn on the skin (lidocaine patch). HOME CARE INSTRUCTIONS It may take a long time to recover from PHN. Work closely with your health care provider, and have a good support system at home.   Take all medicines as directed by your health care provider.  Wear loose, comfortable clothing.  Cover sensitive areas with a dressing to reduce friction from clothing rubbing on the area.  If cold does not make your pain worse, try applying a cool compress or cooling gel pack to the area.  Talk to your health care provider if you feel depressed or desperate. Living with long-term pain can be depressing. SEEK MEDICAL CARE IF:  Your medicine is not helping.  You are struggling to manage your pain at home. Document Released: 05/06/2002 Document Revised: 06/30/2013 Document Reviewed: 02/04/2013 [facility] Patient Information 2015 [employer] [place]. This information is not intended to replace advice given to you  by your health care provider. Make sure you discuss any questions you have with your health care provider.  

## 2014-06-05 NOTE — Progress Notes (Signed)
Subjective:     Patient ID: Amy Whitehead, female   DOB: 09/21/1933, 81 y.o.   MRN: [id number]  HPI Amy Whitehead is a 81yo female presenting today for follow up of post-herpetic neuralgia. - Transitioned from Lyrica to Gapapentin since last office visit - States Gabapentin has been more affordable - At first Gabapentin seemed to make pain worse, but now seems to be helping a small amount. Would like to begin titrating up - Pain located over left mid-axillary region in distribution of previous episodes of shingles - Was recently seen by neurosurgery who gave her a DVD on spinal stimulation. She does not have a DVD player or computer so she has not been able to watch the DVD - Notes calluses on both of her feet, however states she has been using Dr. Margart SicklesScholl's salicylic acid pads for these, which help. Has followed with Podiatry in the past but she would like referral to another doctor if the pads become ineffective. Does not wish for referral at this time. - Has not been taking her Aspirin 81mg   Review of Systems  Respiratory: Negative for shortness of breath.   Cardiovascular: Negative for chest pain.  Musculoskeletal: Positive for myalgias and back pain.  Skin: Negative for rash.       Objective:   Physical Exam  Constitutional: She is oriented to person, place, and time. She appears well-developed and well-nourished. No distress.  Cardiovascular: Regular rhythm.  Exam reveals no gallop and no friction rub.   No murmur heard. Pulmonary/Chest: Effort normal and breath sounds normal. No respiratory distress. She has no wheezes.  Abdominal: Soft. Bowel sounds are normal. She exhibits no distension. There is no tenderness.  Musculoskeletal: She exhibits no edema.  Pain palpable over left mid-axillary region, which patient states is in same distribution as previus shingles. No midline tenderness noted.  Neurological: She is alert and oriented to person, place, and time.  Skin: Skin is warm and  dry. No rash noted.  Refused exam of calluses on feet, stating she has her Dr. Margart SicklesScholl's pads on and do not wish to remove them      Assessment:     Please refer to Problem List for Assessment.     Plan:     Please refer to Problem List for Plan. Discussed importance of taking her Aspirin 81mg .  Will begin taking this again.

## 2014-06-06 NOTE — Assessment & Plan Note (Signed)
-   Currently on Gabapentin 300mg  TID. Discussed dose titration with pharmacy. Recommended increasing to 600mg  TID. - Coupon for Gabapentin given - Instructed to call office to let us know if increased dose of Gabapentin effective in treating nerve pain - Played DVD given by neurosurgery in office room on computer. Encouraged her to follow up with them to discuss procedure.

## 2014-07-15 ENCOUNTER — Ambulatory Visit (INDEPENDENT_AMBULATORY_CARE_PROVIDER_SITE_OTHER): Payer: Commercial Managed Care - HMO | Admitting: Family Medicine

## 2014-07-15 ENCOUNTER — Encounter: Payer: Self-pay | Admitting: Family Medicine

## 2014-07-15 VITALS — BP 112/72 | Temp 98.0°F | Ht 67.0 in | Wt 155.5 lb

## 2014-07-15 DIAGNOSIS — M25562 Pain in left knee: Secondary | ICD-10-CM | POA: Diagnosis not present

## 2014-07-15 DIAGNOSIS — B0229 Other postherpetic nervous system involvement: Secondary | ICD-10-CM

## 2014-07-15 DIAGNOSIS — I868 Varicose veins of other specified sites: Secondary | ICD-10-CM | POA: Diagnosis not present

## 2014-07-15 DIAGNOSIS — I8390 Asymptomatic varicose veins of unspecified lower extremity: Secondary | ICD-10-CM

## 2014-07-15 NOTE — Patient Instructions (Signed)
Thank you so much for coming in today! I'm glad you are feeling better on the Gabapentin. I will place a referral to [facility] today.  Thanks again, Dr. Caroleen Hammanumley

## 2014-07-18 DIAGNOSIS — I8390 Asymptomatic varicose veins of unspecified lower extremity: Secondary | ICD-10-CM | POA: Insufficient documentation

## 2014-07-18 NOTE — Assessment & Plan Note (Signed)
-   Reports that she has seen Vascular in past for this and wishes for referral so she can be evaluated for possible intervention. Referral placed

## 2014-07-18 NOTE — Assessment & Plan Note (Signed)
-   Continue Gabapentin at current dose. Will consider titrating up if indicated at future visits.

## 2014-07-18 NOTE — Progress Notes (Signed)
Subjective:     Patient ID: Amy Whitehead, female   DOB: 12/06/1933, 81 y.o.   MRN: [id number]  HPI Amy Whitehead is a 81yo female presenting today to discuss medications: - States current dose of Gabapentin is working well for Post-herpetic Neuralgia. - Needs no refills at this time - Has seen Vascular for Varicose veins in the past. Notes that veins appear to be worsening and are more painful. Would like referral again to have these evaluated. States left leg is worse to right, but she experiences pain bilaterally. - Denies any other complaints at this time.  Review of Systems  Respiratory: Negative for shortness of breath.   Cardiovascular: Negative for chest pain.  All other systems reviewed and are negative.      Objective:   Physical Exam  Constitutional: She appears well-developed and well-nourished. No distress.  Cardiovascular: Normal rate and regular rhythm.  Exam reveals no gallop and no friction rub.   Murmur heard. Pulmonary/Chest: Effort normal. No respiratory distress. She has no wheezes. She has no rales.  Abdominal: Soft. She exhibits no distension. There is no tenderness.  Musculoskeletal: She exhibits no edema.  Skin:  Varicose veins noted bilaterally       Assessment:     Please refer to Problem List for Assessment.     Plan:     Please refer to Problem List for Plan.

## 2014-08-03 ENCOUNTER — Telehealth: Payer: Self-pay | Admitting: Family Medicine

## 2014-08-03 DIAGNOSIS — M79652 Pain in left thigh: Secondary | ICD-10-CM | POA: Diagnosis not present

## 2014-08-03 DIAGNOSIS — M79605 Pain in left leg: Secondary | ICD-10-CM | POA: Diagnosis not present

## 2014-08-03 DIAGNOSIS — I87392 Chronic venous hypertension (idiopathic) with other complications of left lower extremity: Secondary | ICD-10-CM | POA: Diagnosis not present

## 2014-08-03 NOTE — Telephone Encounter (Signed)
Pt called because she was seen [facility] and there is nothing they can do. So she is willing to increase the gabapentin 300 mg to help with this. She will need a a new prescription to reflect the increase sent to her pharmacy. jw

## 2014-08-04 MED ORDER — GABAPENTIN 300 MG PO CAPS: 900.0000 mg | ORAL_CAPSULE | Freq: Three times a day (TID) | ORAL | Status: DC

## 2014-08-04 NOTE — Telephone Encounter (Signed)
[name] is calling once more to check on the status of this request. THank you, Dorothey BasemanSadie Reynolds, ASA

## 2014-08-04 NOTE — Telephone Encounter (Signed)
Discussed with pharmacy. Order placed for 900TID. Will need to check creatinine at next visit. May titrate up if needed, but 900 qid is max dose.  Dr. Caroleen Hammanumley

## 2014-10-12 ENCOUNTER — Encounter: Payer: Self-pay | Admitting: Family Medicine

## 2014-10-12 ENCOUNTER — Ambulatory Visit (INDEPENDENT_AMBULATORY_CARE_PROVIDER_SITE_OTHER): Payer: Commercial Managed Care - HMO | Admitting: Family Medicine

## 2014-10-12 VITALS — BP 135/61 | HR 60 | Temp 97.7°F | Ht 67.0 in | Wt 150.6 lb

## 2014-10-12 DIAGNOSIS — B0229 Other postherpetic nervous system involvement: Secondary | ICD-10-CM | POA: Diagnosis not present

## 2014-10-12 NOTE — Assessment & Plan Note (Signed)
-   States pain is well controlled on Gabapentin  TID - Suspects enuresis is due to increased sedation on Gabapentin. Denies incontinence during day, stating she is just not always waking up to urinate and sometimes can't make it to restroom.  - Recommend avoiding fluids after 6pm - Will move night dose to supper time. If ineffective in controlling symptoms, recommend decreasing dose of Gabapentin to  at night.

## 2014-10-12 NOTE — Progress Notes (Signed)
Subjective:     Patient ID: Amy Whitehead, female   DOB: 1933-05-17, 81 y.o.   MRN: [id number]  HPI Amy Whitehead is a 81yo female presenting today to discuss gabapentin.  - States gabapentin is working really well for her postherpetic neuralgia. She has had more good days than bad days since starting it, which is the best it's ever been controlled - Currently takes  TID - Reports that since taking Gabapentin, she sometimes experiences nocturnal enuresis or wakes up and can't get to the bathroom in time. This does not occur during the day. Does not feel that she is incontinent, just that she isn't always waking up to urinate.  - Denies increased frequency, dysuria, or urgency - Denies saddle anesthesia - Pain first started after episode of shingles. States pain is in same spot as shingles were and has been unchanged in terms of quality or distribution since that time. - No further concerns today  PMH reviewed and discussed.  Review of Systems  Genitourinary: Positive for enuresis. Negative for dysuria, urgency and frequency.      Objective:   Physical Exam  Constitutional: She is oriented to person, place, and time. She appears well-developed and well-nourished. No distress.  Cardiovascular: Normal rate and regular rhythm.   Murmur heard. Pulmonary/Chest: Effort normal. No respiratory distress. She has no wheezes. She has no rales.  Abdominal: Soft. She exhibits no distension. There is no tenderness.  Neurological: She is alert and oriented to person, place, and time.  No saddle anesthesia noted       Assessment:     Please refer to Problem List for Assessment.    Plan:     Please refer to Problem List for Plan.

## 2014-10-12 NOTE — Patient Instructions (Signed)
Thank you so much for coming to visit me today!  I'm so glad the Gabapentin is working so well for you! Gabapentin can make you more sleepy at night. Please try taking your night dose a few hours earlier. If this doesn't work, you can consider only taking 2 tablets at night. I also recommend not drinking anything after 6pm.  Please let me know if there's anything else I can do for you!  Dr. Caroleen Hamman

## 2014-10-21 ENCOUNTER — Telehealth: Payer: Self-pay | Admitting: Family Medicine

## 2014-10-21 NOTE — Telephone Encounter (Signed)
Need refill for gabapentin sent to [facility] at [facility]

## 2014-10-22 MED ORDER — GABAPENTIN 300 MG PO CAPS: 900.0000 mg | ORAL_CAPSULE | Freq: Three times a day (TID) | ORAL | Status: DC

## 2014-10-22 NOTE — ED Notes (Signed)
Gabapentin refilled  Araceli Bouche, DO 10/22/14 1609

## 2014-10-22 NOTE — Telephone Encounter (Signed)
2nd request for refill on gabapentin medication. Please contact patient when ready.

## 2014-10-26 DIAGNOSIS — Z1231 Encounter for screening mammogram for malignant neoplasm of breast: Secondary | ICD-10-CM | POA: Diagnosis not present

## 2014-11-16 ENCOUNTER — Telehealth: Payer: Self-pay | Admitting: Family Medicine

## 2014-11-16 MED ORDER — GABAPENTIN 300 MG PO CAPS: 1200.0000 mg | ORAL_CAPSULE | Freq: Three times a day (TID) | ORAL | Status: DC

## 2014-11-16 NOTE — Telephone Encounter (Signed)
Patient only has enough meds to last her until tomorrow and would like to know if Dr.Rumley will increase the dosages.

## 2014-11-16 NOTE — Telephone Encounter (Signed)
She may increase it to  (four tablets) three times a day bringing her daily total to . This would be the maximum dose, however and we can not increase it above this amount. Order sent to pharmacy.

## 2014-11-16 NOTE — Telephone Encounter (Signed)
Pt called and would like to know if she can increase her Gabapentin to 4 instead 3. If so can we call in the dosage change since it is time to pick up her prescription today. jw

## 2014-11-17 NOTE — Telephone Encounter (Signed)
Prior Authorization received from [facility] pharmacy for gabapentin 300 mg. Medication is covered under patient's insurance; there is a quantity limit of 270 capsule per 30 day.  PA need to be completed for over 270 capsules. PA a form placed in provider box for review.  Clovis Pu, RN

## 2014-11-18 NOTE — Telephone Encounter (Signed)
Mrs. Bamberg is contacting us this morning to check on this. I explained the messages below. She expressed understanding, and would kindly appreciate it if we could contact her to let her know the result of this process as she is presenting the fear of "making a fool of herself by going to the pharmacy". Thank you, Dorothey Baseman, ASA

## 2014-11-18 NOTE — Telephone Encounter (Signed)
PA form faxed to [employer] for review.  The review process could take 24-72 hours to complete. Martin, Tamika L, RN  

## 2014-11-20 NOTE — Telephone Encounter (Signed)
Patient informed that PA for Gabapentin 300 mg 360/30 day.  [facility] pharmacy is aware of approval.  Approval good until 11/20/2015.  Clovis Pu, RN

## 2014-12-22 ENCOUNTER — Encounter: Payer: Self-pay | Admitting: Family Medicine

## 2014-12-28 DIAGNOSIS — H5210 Myopia, unspecified eye: Secondary | ICD-10-CM | POA: Diagnosis not present

## 2014-12-28 DIAGNOSIS — H524 Presbyopia: Secondary | ICD-10-CM | POA: Diagnosis not present

## 2015-02-17 ENCOUNTER — Telehealth: Payer: Self-pay | Admitting: Family Medicine

## 2015-02-17 NOTE — Telephone Encounter (Signed)
Saw article in mondays paper about bladder rescue. Pt wants to know if this would be safe for her to take, pt can bring the article to dr for her to read if she hasnt heard of it pplease advise

## 2015-02-18 NOTE — Telephone Encounter (Signed)
Would like to discuss with our clinical pharmacist, who is on vacation at this time. Will discuss with him and then contact Amy Whitehead over the next 1-2 weeks.

## 2015-02-23 NOTE — Telephone Encounter (Signed)
Pt was informed by Sadie earlier today. Olga Seyler, Maryjo RochesterJessica Dawn

## 2015-03-03 NOTE — Telephone Encounter (Signed)
Medication not recommended.

## 2015-03-04 ENCOUNTER — Telehealth: Payer: Self-pay | Admitting: Family Medicine

## 2015-03-04 NOTE — Telephone Encounter (Signed)
Pt would like to know if the "Meningococcal Pneumonia" shot will be available for her appt on Monday. Amy Whitehead, ASA

## 2015-03-04 NOTE — Telephone Encounter (Signed)
Will forward to [employer] Team.  Clovis PuMartin, Tahjanae Blankenburg L, RN

## 2015-03-05 ENCOUNTER — Other Ambulatory Visit: Payer: Self-pay | Admitting: Family Medicine

## 2015-03-05 NOTE — Telephone Encounter (Signed)
Will be closed due to inclement weather for this upcoming visit.  Front office will be contacting pt to reschedule. Lamonte SakaiZimmerman Rumple, April D, [place]

## 2015-03-08 ENCOUNTER — Ambulatory Visit: Payer: Commercial Managed Care - HMO | Admitting: Family Medicine

## 2015-03-11 ENCOUNTER — Telehealth: Payer: Self-pay | Admitting: Family Medicine

## 2015-03-11 ENCOUNTER — Encounter: Payer: Self-pay | Admitting: Family Medicine

## 2015-03-11 ENCOUNTER — Ambulatory Visit (INDEPENDENT_AMBULATORY_CARE_PROVIDER_SITE_OTHER): Payer: Commercial Managed Care - HMO | Admitting: Family Medicine

## 2015-03-11 VITALS — BP 115/50 | HR 67 | Temp 97.4°F | Ht 67.0 in | Wt 150.3 lb

## 2015-03-11 DIAGNOSIS — M722 Plantar fascial fibromatosis: Secondary | ICD-10-CM | POA: Diagnosis not present

## 2015-03-11 DIAGNOSIS — R351 Nocturia: Secondary | ICD-10-CM

## 2015-03-11 LAB — POCT URINALYSIS DIPSTICK
Bilirubin, UA: NEGATIVE
Blood, UA: NEGATIVE
Glucose, UA: NEGATIVE
Nitrite, UA: NEGATIVE
Protein, UA: NEGATIVE
Spec Grav, UA: >=1.03
Urobilinogen, UA: 0.2
pH, UA: 5.5

## 2015-03-11 LAB — BASIC METABOLIC PANEL WITH GFR
BUN: 15 mg/dL (ref 7–25)
CO2: 31 mmol/L (ref 20–31)
Calcium: 9.4 mg/dL (ref 8.6–10.4)
Chloride: 106 mmol/L (ref 98–110)
Creat: 0.96 mg/dL — ABNORMAL HIGH (ref 0.60–0.93)
GFR, Est African American: 65 mL/min (ref >=60)
GFR, Est Non African American: 56 mL/min — ABNORMAL LOW (ref >=60)
Glucose, Bld: 80 mg/dL (ref 65–99)
Potassium: 4.4 mmol/L (ref 3.5–5.3)
Sodium: 145 mmol/L (ref 135–146)

## 2015-03-11 LAB — POCT UA - MICROSCOPIC ONLY

## 2015-03-11 NOTE — Progress Notes (Signed)
Subjective:     Patient ID: Amy Whitehead, female   DOB: 09/17/1933, [age]Elain[age] Pat[age] y.o.   MRN: [id number]  HPI [name] is a 82yo female presenting today for several complaints. # Nocturia: - Reports having to get up for 2-3 urination at night - Has been getting worse over the last several months - Does not always have enough time after awaking to make it to bathroom - Denies any urinary symptoms during the day. Does not have increased frequency, urgency, or dysuria. - Denies abdominal pain - Doesn't drink after 6pm - Last BMP in 12/2012 with Creatinine 0.83  # Plantar Fasciitis: - Has been occuring off and on for many years - Used to follow with Podiatry, who were considering taking her to surgery - Has tried injections, inserts, medications, etc. - Would like referral to another Podiatrist. Has seen Dr. Mercy Ridingoms on TV and would like to be referred to him if possible - When flared, pain worse when getting up in morning and when rising after resting for extended period  - Past medical history and previous labs reviewed.  Review of Systems Per HPI. Otherwise negative.    Objective:   Physical Exam  Constitutional: She appears well-developed and well-nourished. No distress.  HENT:  Head: Normocephalic and atraumatic.  Abdominal: Soft. She exhibits no distension. There is no tenderness.  No CVA tenderness noted  Musculoskeletal: She exhibits no edema.  Tenderness over left plantar fascia with palpation  Neurological: She is alert.  Psychiatric: She has a normal mood and affect. Her behavior is normal.       Assessment and Plan:     Nocturia - Will check BMP and Urinalysis - Recommend setting alarm to wake up once a night to go to bathroom and going to restroom after bed - Continue to avoid fluids after 6pm. Consider journal of fluids consumed during day and nocturnal symptoms - Consider referral to Urologist  Plantar fasciitis of left foot - Previously followed by [facility].  Would like referral elsewhere. - Referral placed.

## 2015-03-11 NOTE — Telephone Encounter (Signed)
Pt called back to give April the date of her last flu shot. It was 11/28/2013. She also wanted to know is it time for another one if so when can she get this. ?

## 2015-03-11 NOTE — Patient Instructions (Signed)
Thank you so much for coming to visit me today! I have placed a referral to Podiatry for you. You should hear from them concerning an appointment soon. We will obtain several labs today and I will contact you with the results. We will determine when you should come back based on the results.  Thanks again! Dr. Caroleen Hammanumley

## 2015-03-12 ENCOUNTER — Ambulatory Visit (INDEPENDENT_AMBULATORY_CARE_PROVIDER_SITE_OTHER): Payer: Commercial Managed Care - HMO | Admitting: *Deleted

## 2015-03-12 DIAGNOSIS — Z23 Encounter for immunization: Secondary | ICD-10-CM

## 2015-03-12 DIAGNOSIS — R351 Nocturia: Secondary | ICD-10-CM | POA: Insufficient documentation

## 2015-03-12 NOTE — Assessment & Plan Note (Signed)
-   Will check BMP and Urinalysis - Recommend setting alarm to wake up once a night to go to bathroom and going to restroom after bed - Continue to avoid fluids after 6pm. Consider journal of fluids consumed during day and nocturnal symptoms - Consider referral to Urologist

## 2015-03-12 NOTE — Assessment & Plan Note (Signed)
-   Previously followed by [facility]. Would like referral elsewhere. - Referral placed.

## 2015-03-12 NOTE — Progress Notes (Signed)
Patient here today to receive Prevnar and flu vaccine.  Altamese Dilling~Lyndell Gillyard, BSN, RN-BC

## 2015-03-16 ENCOUNTER — Telehealth: Payer: Self-pay | Admitting: Family Medicine

## 2015-03-16 DIAGNOSIS — R351 Nocturia: Secondary | ICD-10-CM

## 2015-03-16 NOTE — Telephone Encounter (Signed)
Contacted concerning lab results.  Would like Urology referral given worsening in symptoms since 2000. Referral placed.

## 2015-03-16 NOTE — Telephone Encounter (Signed)
Patient has already come in and received her flu and pneumovax. Jazmin Hartsell,CMA

## 2015-03-23 DIAGNOSIS — M722 Plantar fascial fibromatosis: Secondary | ICD-10-CM | POA: Diagnosis not present

## 2015-03-23 DIAGNOSIS — M7731 Calcaneal spur, right foot: Secondary | ICD-10-CM | POA: Diagnosis not present

## 2015-03-23 DIAGNOSIS — M71571 Other bursitis, not elsewhere classified, right ankle and foot: Secondary | ICD-10-CM | POA: Diagnosis not present

## 2015-03-23 DIAGNOSIS — M7732 Calcaneal spur, left foot: Secondary | ICD-10-CM | POA: Diagnosis not present

## 2015-03-23 DIAGNOSIS — M71572 Other bursitis, not elsewhere classified, left ankle and foot: Secondary | ICD-10-CM | POA: Diagnosis not present

## 2015-03-23 DIAGNOSIS — M76822 Posterior tibial tendinitis, left leg: Secondary | ICD-10-CM | POA: Diagnosis not present

## 2015-03-30 DIAGNOSIS — M722 Plantar fascial fibromatosis: Secondary | ICD-10-CM | POA: Diagnosis not present

## 2015-03-30 DIAGNOSIS — M71572 Other bursitis, not elsewhere classified, left ankle and foot: Secondary | ICD-10-CM | POA: Diagnosis not present

## 2015-04-26 DIAGNOSIS — T8189XA Other complications of procedures, not elsewhere classified, initial encounter: Secondary | ICD-10-CM | POA: Diagnosis not present

## 2015-04-26 DIAGNOSIS — L97521 Non-pressure chronic ulcer of other part of left foot limited to breakdown of skin: Secondary | ICD-10-CM | POA: Diagnosis not present

## 2015-04-29 DIAGNOSIS — Z0000 Encounter for general adult medical examination without abnormal findings: Secondary | ICD-10-CM | POA: Diagnosis not present

## 2015-04-29 DIAGNOSIS — R351 Nocturia: Secondary | ICD-10-CM | POA: Diagnosis not present

## 2015-05-06 DIAGNOSIS — M722 Plantar fascial fibromatosis: Secondary | ICD-10-CM | POA: Diagnosis not present

## 2015-05-06 DIAGNOSIS — M76822 Posterior tibial tendinitis, left leg: Secondary | ICD-10-CM | POA: Diagnosis not present

## 2015-05-06 DIAGNOSIS — M71572 Other bursitis, not elsewhere classified, left ankle and foot: Secondary | ICD-10-CM | POA: Diagnosis not present

## 2015-05-06 DIAGNOSIS — L97521 Non-pressure chronic ulcer of other part of left foot limited to breakdown of skin: Secondary | ICD-10-CM | POA: Diagnosis not present

## 2015-05-06 DIAGNOSIS — M76821 Posterior tibial tendinitis, right leg: Secondary | ICD-10-CM | POA: Diagnosis not present

## 2015-05-13 DIAGNOSIS — M76822 Posterior tibial tendinitis, left leg: Secondary | ICD-10-CM | POA: Diagnosis not present

## 2015-05-25 DIAGNOSIS — R351 Nocturia: Secondary | ICD-10-CM | POA: Diagnosis not present

## 2015-05-25 DIAGNOSIS — Z0000 Encounter for general adult medical examination without abnormal findings: Secondary | ICD-10-CM | POA: Diagnosis not present

## 2015-05-25 DIAGNOSIS — R32 Unspecified urinary incontinence: Secondary | ICD-10-CM | POA: Diagnosis not present

## 2015-06-17 DIAGNOSIS — M76822 Posterior tibial tendinitis, left leg: Secondary | ICD-10-CM | POA: Diagnosis not present

## 2015-07-05 DIAGNOSIS — R351 Nocturia: Secondary | ICD-10-CM | POA: Diagnosis not present

## 2015-07-13 ENCOUNTER — Telehealth: Payer: Self-pay | Admitting: Family Medicine

## 2015-07-13 MED ORDER — GABAPENTIN 300 MG PO CAPS: 1200.0000 mg | ORAL_CAPSULE | Freq: Three times a day (TID) | ORAL | Status: DC

## 2015-07-13 NOTE — Telephone Encounter (Signed)
Need refill on her gabapentin before next Saturday.

## 2015-07-13 NOTE — Telephone Encounter (Signed)
Done

## 2015-08-26 ENCOUNTER — Telehealth: Payer: Self-pay | Admitting: *Deleted

## 2015-08-26 DIAGNOSIS — M722 Plantar fascial fibromatosis: Secondary | ICD-10-CM

## 2015-08-26 NOTE — Telephone Encounter (Signed)
Podiatrist Referral placed.

## 2015-08-26 NOTE — Telephone Encounter (Signed)
Patient was previously referred to podiatrist (Dr. Zachary Georgeomaszewski) earlier this year but patient states he is not doing anything for her plantar fascitis and she would like a referral to a different podiatrist in town.

## 2015-09-21 ENCOUNTER — Ambulatory Visit (INDEPENDENT_AMBULATORY_CARE_PROVIDER_SITE_OTHER): Payer: Commercial Managed Care - HMO | Admitting: Podiatry

## 2015-09-21 ENCOUNTER — Ambulatory Visit (INDEPENDENT_AMBULATORY_CARE_PROVIDER_SITE_OTHER): Payer: Commercial Managed Care - HMO

## 2015-09-21 ENCOUNTER — Encounter: Payer: Self-pay | Admitting: Podiatry

## 2015-09-21 DIAGNOSIS — B351 Tinea unguium: Secondary | ICD-10-CM | POA: Diagnosis not present

## 2015-09-21 DIAGNOSIS — M79676 Pain in unspecified toe(s): Secondary | ICD-10-CM | POA: Diagnosis not present

## 2015-09-21 DIAGNOSIS — M722 Plantar fascial fibromatosis: Secondary | ICD-10-CM | POA: Diagnosis not present

## 2015-09-22 NOTE — Progress Notes (Signed)
She presents today with chief complaint of painful plantar heels medial longitudinal arch and elongated toenails. She states that she went to visit Dr. Elijah Birk who put her in insoles which really did not help. She states that it just really hurts right in here and she points to plantar aspect of her plantar fascia. It is feels like it is pulling all the time.  Objective: I have reviewed her past medical history medications allergies surgeries and social history. Pulses are palpable. Neurologic sensorium is intact. She has pain on palpation just distal to the insertion site of the plantar fascia on the plantar aspect of the foot. It is very pinpoint. Toenails are long and thick yellow dystrophic clinic mycotic.  Assessment pain and limps a new onychomycosis and plantar fasciitis bilateral.  Plan: I injected the plantar fascia today with dexamethasone and local anesthetic directly through the plantar aspect of the foot and into the point of maximal tenderness. Also debrided her toenails for her. I'll follow-up with her in a month or so.

## 2015-10-14 ENCOUNTER — Encounter: Payer: Self-pay | Admitting: *Deleted

## 2015-10-14 ENCOUNTER — Ambulatory Visit (INDEPENDENT_AMBULATORY_CARE_PROVIDER_SITE_OTHER): Payer: Commercial Managed Care - HMO | Admitting: *Deleted

## 2015-10-14 VITALS — BP 106/53 | HR 60 | Temp 98.3°F | Ht 67.0 in | Wt 142.4 lb

## 2015-10-14 DIAGNOSIS — Z0000 Encounter for general adult medical examination without abnormal findings: Secondary | ICD-10-CM

## 2015-10-14 NOTE — Patient Instructions (Signed)
 Fall Prevention in the Home  Falls can cause injuries. They can happen to people of all ages. There are many things you can do to make your home safe and to help prevent falls.  WHAT CAN I DO ON THE OUTSIDE OF MY HOME?  Regularly fix the edges of walkways and driveways and fix any cracks.  Remove anything that might make you trip as you walk through a door, such as a raised step or threshold.  Trim any bushes or trees on the path to your home.  Use bright outdoor lighting.  Clear any walking paths of anything that might make someone trip, such as rocks or tools.  Regularly check to see if handrails are loose or broken. Make sure that both sides of any steps have handrails.  Any raised decks and porches should have guardrails on the edges.  Have any leaves, snow, or ice cleared regularly.  Use sand or salt on walking paths during winter.  Clean up any spills in your garage right away. This includes oil or grease spills. WHAT CAN I DO IN THE BATHROOM?   Use night lights.  Install grab bars by the toilet and in the tub and shower. Do not use towel bars as grab bars.  Use non-skid mats or decals in the tub or shower.  If you need to sit down in the shower, use a plastic, non-slip stool.  Keep the floor dry. Clean up any water that spills on the floor as soon as it happens.  Remove soap buildup in the tub or shower regularly.  Attach bath mats securely with double-sided non-slip rug tape.  Do not have throw rugs and other things on the floor that can make you trip. WHAT CAN I DO IN THE BEDROOM?  Use night lights.  Make sure that you have a light by your bed that is easy to reach.  Do not use any sheets or blankets that are too big for your bed. They should not hang down onto the floor.  Have a firm chair that has side arms. You can use this for support while you get dressed.  Do not have throw rugs and other things on the floor that can make you trip. WHAT CAN I DO  IN THE KITCHEN?  Clean up any spills right away.  Avoid walking on wet floors.  Keep items that you use a lot in easy-to-reach places.  If you need to reach something above you, use a strong step stool that has a grab bar.  Keep electrical cords out of the way.  Do not use floor polish or wax that makes floors slippery. If you must use wax, use non-skid floor wax.  Do not have throw rugs and other things on the floor that can make you trip. WHAT CAN I DO WITH MY STAIRS?  Do not leave any items on the stairs.  Make sure that there are handrails on both sides of the stairs and use them. Fix handrails that are broken or loose. Make sure that handrails are as long as the stairways.  Check any carpeting to make sure that it is firmly attached to the stairs. Fix any carpet that is loose or worn.  Avoid having throw rugs at the top or bottom of the stairs. If you do have throw rugs, attach them to the floor with carpet tape.  Make sure that you have a light switch at the top of the stairs and the bottom of the   stairs. If you do not have them, ask someone to add them for you. WHAT ELSE CAN I DO TO HELP PREVENT FALLS?  Wear shoes that:  Do not have high heels.  Have rubber bottoms.  Are comfortable and fit you well.  Are closed at the toe. Do not wear sandals.  If you use a stepladder:  Make sure that it is fully opened. Do not climb a closed stepladder.  Make sure that both sides of the stepladder are locked into place.  Ask someone to hold it for you, if possible.  Clearly mark and make sure that you can see:  Any grab bars or handrails.  First and last steps.  Where the edge of each step is.  Use tools that help you move around (mobility aids) if they are needed. These include:  Canes.  Walkers.  Scooters.  Crutches.  Turn on the lights when you go into a dark area. Replace any light bulbs as soon as they burn out.  Set up your furniture so you have a clear  path. Avoid moving your furniture around.  If any of your floors are uneven, fix them.  If there are any pets around you, be aware of where they are.  Review your medicines with your doctor. Some medicines can make you feel dizzy. This can increase your chance of falling. Ask your doctor what other things that you can do to help prevent falls.   This information is not intended to replace advice given to you by your health care provider. Make sure you discuss any questions you have with your health care provider.   Document Released: 12/10/2008 Document Revised: 06/30/2014 Document Reviewed: 03/20/2014 Elsevier Interactive Patient Education 2016 [employer]  Health Maintenance, Female Adopting a healthy lifestyle and getting preventive care can go a long way to promote health and wellness. Talk with your health care provider about what schedule of regular examinations is right for you. This is a good chance for you to check in with your provider about disease prevention and staying healthy. In between checkups, there are plenty of things you can do on your own. Experts have done a lot of research about which lifestyle changes and preventive measures are most likely to keep you healthy. Ask your health care provider for more information. WEIGHT AND DIET  Eat a healthy diet  Be sure to include plenty of vegetables, fruits, low-fat dairy products, and lean protein.  Do not eat a lot of foods high in solid fats, added sugars, or salt.  Get regular exercise. This is one of the most important things you can do for your health.  Most adults should exercise for at least 150 minutes each week. The exercise should increase your heart rate and make you sweat (moderate-intensity exercise).  Most adults should also do strengthening exercises at least twice a week. This is in addition to the moderate-intensity exercise.  Maintain a healthy weight  Body mass index (BMI) is a measurement that can  be used to identify possible weight problems. It estimates body fat based on height and weight. Your health care provider can help determine your BMI and help you achieve or maintain a healthy weight.  For females 20 years of age and older:   A BMI below 18.5 is considered underweight.  A BMI of 18.5 to 24.9 is normal.  A BMI of 25 to 29.9 is considered overweight.  A BMI of 30 and above is considered obese.  Watch levels   of cholesterol and blood lipids  You should start having your blood tested for lipids and cholesterol at 80 years of age, then have this test every 5 years.  You may need to have your cholesterol levels checked more often if:  Your lipid or cholesterol levels are high.  You are older than 80 years of age.  You are at high risk for heart disease.  CANCER SCREENING   Lung Cancer  Lung cancer screening is recommended for adults 53-62 years old who are at high risk for lung cancer because of a history of smoking.  A yearly low-dose CT scan of the lungs is recommended for people who:  Currently smoke.  Have quit within the past 15 years.  Have at least a 30-pack-year history of smoking. A pack year is smoking an average of one pack of cigarettes a day for 1 year.  Yearly screening should continue until it has been 15 years since you quit.  Yearly screening should stop if you develop a health problem that would prevent you from having lung cancer treatment.  Breast Cancer  Practice breast self-awareness. This means understanding how your breasts normally appear and feel.  It also means doing regular breast self-exams. Let your health care provider know about any changes, no matter how small.  If you are in your 20s or 30s, you should have a clinical breast exam (CBE) by a health care provider every 1-3 years as part of a regular health exam.  If you are 4 or older, have a CBE every year. Also consider having a breast X-ray (mammogram) every year.  If  you have a family history of breast cancer, talk to your health care provider about genetic screening.  If you are at high risk for breast cancer, talk to your health care provider about having an MRI and a mammogram every year.  Breast cancer gene (BRCA) assessment is recommended for women who have family members with BRCA-related cancers. BRCA-related cancers include:  Breast.  Ovarian.  Tubal.  Peritoneal cancers.  Results of the assessment will determine the need for genetic counseling and BRCA1 and BRCA2 testing. Cervical Cancer Your health care provider may recommend that you be screened regularly for cancer of the pelvic organs (ovaries, uterus, and vagina). This screening involves a pelvic examination, including checking for microscopic changes to the surface of your cervix (Pap test). You may be encouraged to have this screening done every 3 years, beginning at age 75.  For women ages 5-65, health care providers may recommend pelvic exams and Pap testing every 3 years, or they may recommend the Pap and pelvic exam, combined with testing for human papilloma virus (HPV), every 5 years. Some types of HPV increase your risk of cervical cancer. Testing for HPV may also be done on women of any age with unclear Pap test results.  Other health care providers may not recommend any screening for nonpregnant women who are considered low risk for pelvic cancer and who do not have symptoms. Ask your health care provider if a screening pelvic exam is right for you.  If you have had past treatment for cervical cancer or a condition that could lead to cancer, you need Pap tests and screening for cancer for at least 20 years after your treatment. If Pap tests have been discontinued, your risk factors (such as having a new sexual partner) need to be reassessed to determine if screening should resume. Some women have medical problems that increase the chance  of getting cervical cancer. In these cases,  your health care provider may recommend more frequent screening and Pap tests. Colorectal Cancer  This type of cancer can be detected and often prevented.  Routine colorectal cancer screening usually begins at 80 years of age and continues through 80 years of age.  Your health care provider may recommend screening at an earlier age if you have risk factors for colon cancer.  Your health care provider may also recommend using home test kits to check for hidden blood in the stool.  A small camera at the end of a tube can be used to examine your colon directly (sigmoidoscopy or colonoscopy). This is done to check for the earliest forms of colorectal cancer.  Routine screening usually begins at age 50.  Direct examination of the colon should be repeated every 5-10 years through 80 years of age. However, you may need to be screened more often if early forms of precancerous polyps or small growths are found. Skin Cancer  Check your skin from head to toe regularly.  Tell your health care provider about any new moles or changes in moles, especially if there is a change in a mole's shape or color.  Also tell your health care provider if you have a mole that is larger than the size of a pencil eraser.  Always use sunscreen. Apply sunscreen liberally and repeatedly throughout the day.  Protect yourself by wearing long sleeves, pants, a wide-brimmed hat, and sunglasses whenever you are outside. HEART DISEASE, DIABETES, AND HIGH BLOOD PRESSURE   High blood pressure causes heart disease and increases the risk of stroke. High blood pressure is more likely to develop in:  People who have blood pressure in the high end of the normal range (130-139/85-89 mm Hg).  People who are overweight or obese.  People who are African American.  If you are 18-39 years of age, have your blood pressure checked every 3-5 years. If you are 40 years of age or older, have your blood pressure checked every year. You  should have your blood pressure measured twice--once when you are at a hospital or clinic, and once when you are not at a hospital or clinic. Record the average of the two measurements. To check your blood pressure when you are not at a hospital or clinic, you can use:  An automated blood pressure machine at a pharmacy.  A home blood pressure monitor.  If you are between 55 years and 79 years old, ask your health care provider if you should take aspirin to prevent strokes.  Have regular diabetes screenings. This involves taking a blood sample to check your fasting blood sugar level.  If you are at a normal weight and have a low risk for diabetes, have this test once every three years after 80 years of age.  If you are overweight and have a high risk for diabetes, consider being tested at a younger age or more often. PREVENTING INFECTION  Hepatitis B  If you have a higher risk for hepatitis B, you should be screened for this virus. You are considered at high risk for hepatitis B if:  You were born in a country where hepatitis B is common. Ask your health care provider which countries are considered high risk.  Your parents were born in a high-risk country, and you have not been immunized against hepatitis B (hepatitis B vaccine).  You have HIV or AIDS.  You use needles to inject street drugs.  You   live with someone who has hepatitis B.  You have had sex with someone who has hepatitis B.  You get hemodialysis treatment.  You take certain medicines for conditions, including cancer, organ transplantation, and autoimmune conditions. Hepatitis C  Blood testing is recommended for:  Everyone born from 1945 through 1965.  Anyone with known risk factors for hepatitis C. Sexually transmitted infections (STIs)  You should be screened for sexually transmitted infections (STIs) including gonorrhea and chlamydia if:  You are sexually active and are younger than 80 years of age.  You  are older than 80 years of age and your health care provider tells you that you are at risk for this type of infection.  Your sexual activity has changed since you were last screened and you are at an increased risk for chlamydia or gonorrhea. Ask your health care provider if you are at risk.  If you do not have HIV, but are at risk, it may be recommended that you take a prescription medicine daily to prevent HIV infection. This is called pre-exposure prophylaxis (PrEP). You are considered at risk if:  You are sexually active and do not regularly use condoms or know the HIV status of your partner(s).  You take drugs by injection.  You are sexually active with a partner who has HIV. Talk with your health care provider about whether you are at high risk of being infected with HIV. If you choose to begin PrEP, you should first be tested for HIV. You should then be tested every 3 months for as long as you are taking PrEP.  PREGNANCY   If you are premenopausal and you may become pregnant, ask your health care provider about preconception counseling.  If you may become pregnant, take 400 to 800 micrograms (mcg) of folic acid every day.  If you want to prevent pregnancy, talk to your health care provider about birth control (contraception). OSTEOPOROSIS AND MENOPAUSE   Osteoporosis is a disease in which the bones lose minerals and strength with aging. This can result in serious bone fractures. Your risk for osteoporosis can be identified using a bone density scan.  If you are 65 years of age or older, or if you are at risk for osteoporosis and fractures, ask your health care provider if you should be screened.  Ask your health care provider whether you should take a calcium or vitamin D supplement to lower your risk for osteoporosis.  Menopause may have certain physical symptoms and risks.  Hormone replacement therapy may reduce some of these symptoms and risks. Talk to your health care  provider about whether hormone replacement therapy is right for you.  HOME CARE INSTRUCTIONS   Schedule regular health, dental, and eye exams.  Stay current with your immunizations.   Do not use any tobacco products including cigarettes, chewing tobacco, or electronic cigarettes.  If you are pregnant, do not drink alcohol.  If you are breastfeeding, limit how much and how often you drink alcohol.  Limit alcohol intake to no more than 1 drink per day for nonpregnant women. One drink equals 12 ounces of beer, 5 ounces of wine, or 1 ounces of hard liquor.  Do not use street drugs.  Do not share needles.  Ask your health care provider for help if you need support or information about quitting drugs.  Tell your health care provider if you often feel depressed.  Tell your health care provider if you have ever been abused or do not feel safe   at home.   This information is not intended to replace advice given to you by your health care provider. Make sure you discuss any questions you have with your health care provider.   Document Released: 08/29/2010 Document Revised: 03/06/2014 Document Reviewed: 01/15/2013 Elsevier Interactive Patient Education 2016 [employer]  

## 2015-10-14 NOTE — Progress Notes (Signed)
Subjective:   Amy Whitehead is a 82 y.o. female who presents for an Initial Medicare Annual Wellness Visit.   Cardiac Risk Factors include: advanced age (>1455men, 61>65 women);sedentary lifestyle     Objective:    Today's Vitals   10/14/15 1344  BP: (!) 106/53  Pulse: 60  Temp: 98.3 F (36.8 C)  TempSrc: Oral  SpO2: 97%  Weight: 142 lb 6.4 oz (64.6 kg)  Height: 5\' 7"  (1.702 m)   Body mass index is 22.3 kg/m.   Current Medications (verified) Outpatient Encounter Prescriptions as of 10/14/2015  Medication Sig  . aspirin EC 81 MG tablet Take 81 mg by mouth daily.  . Calcium-Vitamin D (CALTRATE 600 PLUS-VIT D PO) Take 2 tablets by mouth 2 (two) times daily.  Marland Kitchen. gabapentin (NEURONTIN) 300 MG capsule Take 4 capsules (1,200 mg total) by mouth 3 (three) times daily.  . Multiple Vitamins-Minerals (MULTIVITAMIN WITH MINERALS) tablet Take 1 tablet by mouth daily.  . Multiple Vitamins-Minerals (VISION-VITE PRESERVE PO) Take 2 tablets by mouth daily.  . naproxen (NAPROSYN) 250 MG tablet Take by mouth 2 (two) times daily with a meal.   No facility-administered encounter medications on file as of 10/14/2015.     Allergies (verified) Review of patient's allergies indicates no known allergies.   History: Past Medical History:  Diagnosis Date  . Arthritis   . Cataract   . Post herpetic neuralgia 04/17/2011   Past Surgical History:  Procedure Laterality Date  . 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  Unsure if uterus was removed, but menses stopped after surgery   Family History  Problem Relation Age of Onset  . Cancer Father     brain tumor  . Cancer Sister     lung  . Diabetes Sister    Social History   Occupational History  . Not on file.   Social History Main Topics  . Smoking status: Former Smoker    Types: Cigarettes    Quit date: 04/16/1962  . Smokeless tobacco: Not on file  . Alcohol use No  . Drug  use: No  . Sexual activity: No    Tobacco Counseling Counseling given: Yes   Activities of Daily Living In your present state of health, do you have any difficulty performing the following activities: 10/14/2015  Hearing? N  Vision? N  Difficulty concentrating or making decisions? N  Walking or climbing stairs? N  Dressing or bathing? N  Doing errands, shopping? N  Preparing Food and eating ? N  Using the Toilet? N  In the past six months, have you accidently leaked urine? Y  Do you have problems with loss of bowel control? N  Managing your Medications? N  Managing your Finances? N  Housekeeping or managing your Housekeeping? N  Some recent data might be hidden   Home Safety:  My home has a working smoke alarm:  Yes times one           My home throw rugs have been fastened down to the floor or removed:  No, will tack down I have non-slip mats in the bathtub and shower:  No, will buy safety strips         All my home's stairs have railings or bannisters: Yes         My home's floors, stairs and hallways are free from clutter, wires and cords:  Yes  Immunizations and Health Maintenance Immunization History  Administered Date(s) Administered  . Influenza,inj,Quad PF,36+ Mos 05/15/2013, 11/28/2013, 03/12/2015  . Pneumococcal Conjugate-13 03/12/2015   Health Maintenance Due  Topic Date Due  . TETANUS/TDAP  10/05/1954  . ZOSTAVAX  10/05/1995  . INFLUENZA VACCINE  09/28/2015  Patient will receive TDaP at local pharmacy Patient states she is sure she received the zostavax but unable to verify at pharmacy or [facility] Patient aware that flu vaccine will begin 10/29/2015 Patient has appt for mamm[facility] 10/27/2015 [facility] Patient Care Team: Araceli Bouchealeigh N Rumley, DO as PCP - General Glenford PeersHoward McFarland, OD as Referring Physician (Optometry) Max Maud Deed Hyatt, DPM as Consulting Physician (Podiatry)  Indicate any recent Medical Services you may have received from other than [facility] providers in  the past year (date may be approximate).     Assessment:   This is a routine wellness examination for Amy Whitehead.   Hearing/Vision screen  Hearing Screening   Method: Audiometry   125Hz  250Hz  500Hz  1000Hz  2000Hz  3000Hz  4000Hz  6000Hz  8000Hz   Right ear:   40 40 40  40    Left ear:   40 40 40  40      Dietary issues and exercise activities discussed: Current Exercise Habits: Home exercise routine, Type of exercise: walking, Time (Minutes): 10, Frequency (Times/Week): 7, Weekly Exercise (Minutes/Week): 70, Exercise limited by: None identified  Goals    None    LDL < 100  Depression Screen PHQ 2/9 Scores 10/14/2015 03/11/2015 10/12/2014 07/15/2014 06/03/2014 07/28/2013 05/15/2013  PHQ - 2 Score 0 0 0 0 0 0 0    Fall Risk Fall Risk  10/14/2015 07/15/2014 06/03/2014 07/28/2013 05/01/2013  Falls in the past year? Yes No No No No  Number falls in past yr: 1 - - - -  Injury with Fall? No - - - -  Risk for fall due to : Other (Comment) - - - -  Risk for fall due to (comments): tripped over dog - - - -    Cognitive Function: Mini-Cog  Passed with score 3/5  TUG Test:  Done in 10 seconds.   Screening Tests Health Maintenance  Topic Date Due  . TETANUS/TDAP  10/05/1954  . ZOSTAVAX  10/05/1995  . INFLUENZA VACCINE  09/28/2015  . PNA vac Low Risk Adult (2 of 2 - PPSV23) 03/11/2016  . DEXA SCAN  Completed      Plan:   Encouraged patient to schedule appt with PCP to discuss plantar fasciitis  During the course of the visit, Amy Whitehead was educated and counseled about the following appropriate screening and preventive services:   Vaccines to include Pneumoccal, Influenza, Td, Zostavax  Cardiovascular disease screening  Colorectal cancer screening  Bone density screening  Diabetes screening  Mammography/PAP  Nutrition counseling   Patient Instructions (the written plan) were given to the patient.    Fredderick SeveranceDUCATTE, LAURENZE L, RN   10/14/2015

## 2015-10-27 DIAGNOSIS — Z1231 Encounter for screening mammogram for malignant neoplasm of breast: Secondary | ICD-10-CM | POA: Diagnosis not present

## 2015-11-17 ENCOUNTER — Other Ambulatory Visit: Payer: Self-pay | Admitting: Family Medicine

## 2015-11-17 NOTE — Telephone Encounter (Signed)
[facility]entin[facility] [address]. Pt only ahas 18 pills left and she takes 12 per day.  [facility]

## 2015-11-18 NOTE — Telephone Encounter (Signed)
Pt called in again about her medication and she is wanting to know when she will get this stated that she is going to run out of medication and wants to be sure that this is sent in today, she said to please let the doctor know that she is not trying to cause any problems she just wants to make sure she doesn't run out. .  I contacted Dr. Caroleen Hammanumley and she stated that she was in a meeting until 5:30-6 and that she would take care of it when she got out of the meeting.  Routing to PCP. Lamonte SakaiZimmerman Rumple, April D, [place]

## 2015-11-18 NOTE — Telephone Encounter (Signed)
Pt is calling to check the status of her request for Gabapentin. She will be out today and really needs this called in. Please do ASAP so that she is not without her medication. Please inform patient when done. jw

## 2015-11-18 NOTE — Telephone Encounter (Signed)
Medication refilled. Did not call concerning refill until 9/20. Refill ordered 9/21.

## 2015-11-18 NOTE — Telephone Encounter (Signed)
Pt notified. Sharon T Saunders, CMA   

## 2015-11-25 NOTE — Progress Notes (Signed)
Patient ID: Elaina PatteeMary P Luscher, female   DOB: 08/16/1933, 82 y.o.   MRN: [id number] ADDENDUM: I have reviewed this visit and discussed with Alecia LemmingLauren Ducatte, RN, BSN, and agree with her documentation.   Dr. Garry Heateraleigh Alnisa Hasley

## 2015-11-26 ENCOUNTER — Encounter: Payer: Self-pay | Admitting: Family Medicine

## 2015-12-06 ENCOUNTER — Encounter: Payer: Self-pay | Admitting: Family Medicine

## 2015-12-21 ENCOUNTER — Encounter: Payer: Self-pay | Admitting: Podiatry

## 2015-12-21 ENCOUNTER — Ambulatory Visit (INDEPENDENT_AMBULATORY_CARE_PROVIDER_SITE_OTHER): Payer: Commercial Managed Care - HMO | Admitting: Podiatry

## 2015-12-21 DIAGNOSIS — M722 Plantar fascial fibromatosis: Secondary | ICD-10-CM

## 2015-12-21 DIAGNOSIS — B351 Tinea unguium: Secondary | ICD-10-CM

## 2015-12-21 DIAGNOSIS — M79676 Pain in unspecified toe(s): Secondary | ICD-10-CM | POA: Diagnosis not present

## 2015-12-21 MED ORDER — NONFORMULARY OR COMPOUNDED ITEM: 2 refills | Status: DC

## 2015-12-21 NOTE — Addendum Note (Signed)
Addended by: Alphia Kava'CONNELL, VALERY D on: 12/21/2015 02:29 PM   Modules accepted: Orders

## 2015-12-21 NOTE — Progress Notes (Signed)
She presents today still complaining of pain to bilateral foot she's also complaining of painful elongated toenails.  Objective: Vital signs are stable she is alert and oriented 3 her toenails are thick yellow dystrophic clinic for mycotic sharp and intubated nail margins and painful on palpation. She also has painful heels and medial longitudinal arches. She states that the injections really do not seem to help.  Assessment: Plantar fasciitis inflammation of the plantar fascia on the longitudinal arch. Pain in limb secondary to onychomycosis.  Plan: Debridement of all reactive hyperkeratoses debridement of toenails 1 through 5 bilateral. We requested a compounded cream for the pain and inflammation of her feet.

## 2015-12-27 ENCOUNTER — Telehealth: Payer: Self-pay | Admitting: Family Medicine

## 2015-12-27 NOTE — Telephone Encounter (Signed)
Pt called and wanted to know if she is on the highest dose of Gabapentin and if not can she go up on this. Please let patient jw

## 2015-12-29 DIAGNOSIS — H524 Presbyopia: Secondary | ICD-10-CM | POA: Diagnosis not present

## 2015-12-31 NOTE — Telephone Encounter (Signed)
We cannot increase Gabapentin any further. If she is not using her lidocaine cream, I would recommend that. I can refill if necessary.

## 2015-12-31 NOTE — Telephone Encounter (Signed)
Pt called again about her request for increased strenght of gabepentin

## 2016-01-03 ENCOUNTER — Ambulatory Visit (INDEPENDENT_AMBULATORY_CARE_PROVIDER_SITE_OTHER): Payer: Commercial Managed Care - HMO | Admitting: *Deleted

## 2016-01-03 DIAGNOSIS — Z23 Encounter for immunization: Secondary | ICD-10-CM

## 2016-01-04 NOTE — Telephone Encounter (Signed)
Contacted pt to give her the below information and at first she was not familiar with the lidocaine cream mentioned and I checked for her and she did say that she got that Dr. Al CorpusHyatt had given her an Rx for a cream and she got the cheapest one and that she just recently ordered more due to being out.  I told her that if the cream doesn't help then she may want to make an appointment with Dr. Al CorpusHyatt or her PCP to see what her options may be going forward. Routing to PCP as an Financial plannerYI. Lamonte SakaiZimmerman Rumple, April D, [place]

## 2016-01-07 ENCOUNTER — Telehealth: Payer: Self-pay | Admitting: Family Medicine

## 2016-01-07 NOTE — Telephone Encounter (Signed)
Pt called again and would like for Dr. Caroleen Hammanumley to give her a call. No UTI symptoms, just bladder issues pt has had for awhile. Wants to try anything that would help, so she doesn't have to wear pads anymore. Please advise. Thanks! ep

## 2016-01-07 NOTE — Telephone Encounter (Signed)
Dr. Caroleen Hammanumley in clinic seeing patients.  Will route note.  Altamese Dilling~Jeannette Richardson, BSN, RN-BC

## 2016-01-07 NOTE — Telephone Encounter (Signed)
Pt would like to know if it is safe to take urivax. Please advise. Thanks! ep

## 2016-01-07 NOTE — Telephone Encounter (Signed)
Left voice message for patient that nurse is not familiar with the drug.  Will forward to PCP.  If patient is having any bladder or UTI symptoms she should call for an appointment.  Clovis PuMartin, Tamika L, RN

## 2016-01-11 NOTE — Telephone Encounter (Signed)
3rd request. Please advise. Thanks! ep °

## 2016-01-14 NOTE — Telephone Encounter (Signed)
LVM for pt to call back to give her the message below. Called after 5 and informed her that we would be back in the office on Monday at 8:30am. Please give her the message when she calls back. Lamonte SakaiZimmerman Rumple, Chariah Bailey D, [place]

## 2016-01-14 NOTE — Telephone Encounter (Signed)
Discussed with nurse on 11/10 that patient should follow up with the Urologist. Please have her contact them and schedule an appointment.

## 2016-01-14 NOTE — Telephone Encounter (Signed)
Pt is calling again about her bladder control problems.  She has called several times about this problem and is getting frustrated about not getting a response,

## 2016-01-17 NOTE — Telephone Encounter (Signed)
Patient returned call and was informed of message from Dr. Caroleen Hammanumley and she plans to call urology to schedule an appointment. Alexx Giambra,CMA

## 2016-01-18 ENCOUNTER — Telehealth: Payer: Self-pay | Admitting: Family Medicine

## 2016-01-18 DIAGNOSIS — N3941 Urge incontinence: Secondary | ICD-10-CM | POA: Diagnosis not present

## 2016-01-18 NOTE — Telephone Encounter (Signed)
Pt is calling because she has an appointment at [facility] to day at 9:30 am. The office told her to have us fax over a referral for [employer]. Please fax attention Reita ClicheBobby [phone]. jw

## 2016-01-18 NOTE — Telephone Encounter (Signed)
Done

## 2016-02-16 DIAGNOSIS — N3941 Urge incontinence: Secondary | ICD-10-CM | POA: Diagnosis not present

## 2016-03-14 ENCOUNTER — Other Ambulatory Visit: Payer: Self-pay | Admitting: Family Medicine

## 2016-03-21 ENCOUNTER — Encounter: Payer: Self-pay | Admitting: Podiatry

## 2016-03-21 ENCOUNTER — Ambulatory Visit (INDEPENDENT_AMBULATORY_CARE_PROVIDER_SITE_OTHER): Payer: Medicare HMO | Admitting: Podiatry

## 2016-03-21 DIAGNOSIS — M722 Plantar fascial fibromatosis: Secondary | ICD-10-CM | POA: Diagnosis not present

## 2016-03-21 DIAGNOSIS — Q828 Other specified congenital malformations of skin: Secondary | ICD-10-CM | POA: Diagnosis not present

## 2016-03-21 DIAGNOSIS — M79676 Pain in unspecified toe(s): Secondary | ICD-10-CM

## 2016-03-21 DIAGNOSIS — B351 Tinea unguium: Secondary | ICD-10-CM | POA: Diagnosis not present

## 2016-03-22 NOTE — Progress Notes (Signed)
She presents today to complain of pain to the left heel. Her toenails are long and thick and she has multiple calluses. She like to have them trimmed.  Objective: Vital signs are stable she is alert and oriented 3 she has pain on palpation medially for future removal of the left heel. Her toenails are thick yellow dystrophic onychomycotic. Multiple porokeratotic lesions plantar aspect bilateral foot this less likely the toes.  Assessment: Plantar fasciitis or keratosis and pain in limb sigmoid onychomycosis.  Plan: Debridement of all reactive hyperkeratoses bilateral. Debridement of toenails 1 through 5 bilateral. Injected left heel today with Kenalog and local anesthetic will provide follow-up visit in 1 month if necessary

## 2016-05-17 DIAGNOSIS — R351 Nocturia: Secondary | ICD-10-CM | POA: Diagnosis not present

## 2016-06-15 ENCOUNTER — Other Ambulatory Visit: Payer: Self-pay | Admitting: Family Medicine

## 2016-06-20 ENCOUNTER — Encounter: Payer: Self-pay | Admitting: Podiatry

## 2016-06-20 ENCOUNTER — Ambulatory Visit (INDEPENDENT_AMBULATORY_CARE_PROVIDER_SITE_OTHER): Payer: Medicare HMO | Admitting: Podiatry

## 2016-06-20 DIAGNOSIS — B351 Tinea unguium: Secondary | ICD-10-CM | POA: Diagnosis not present

## 2016-06-20 DIAGNOSIS — Q828 Other specified congenital malformations of skin: Secondary | ICD-10-CM

## 2016-06-20 DIAGNOSIS — M79676 Pain in unspecified toe(s): Secondary | ICD-10-CM

## 2016-06-20 NOTE — Progress Notes (Signed)
She presents today for a chief complaint of painful calluses bilateral. She's also complaining of painful elongated toenails. Last time I saw her 03/21/2017.  Objective she has obviously placed salicylic acid pads on all of her hyperkeratotic lesions and left them on. At this point once I removed them today they have down through the epidermis to the dermis. Only the one on the left heel is slightly bloody. Painful elongated toenails 1 through 5 bilateral. No open lesions or wounds are noted with the exception of those mentioned above.  Assessment pain and limp secondary to onychomycosis porokeratosis. Wound to the left heel associated with salicylic acid pads  Plan: Debrided all reactive hyperkeratosis. Debrided toenails 1 through 5 bilateral placed bandages today. Instructed her to follow up with me sooner than normal if necessary but to watch the foot on a daily basis.

## 2016-08-15 ENCOUNTER — Encounter: Payer: Self-pay | Admitting: Podiatry

## 2016-08-15 ENCOUNTER — Ambulatory Visit (INDEPENDENT_AMBULATORY_CARE_PROVIDER_SITE_OTHER): Payer: Medicare HMO | Admitting: Podiatry

## 2016-08-15 DIAGNOSIS — M79676 Pain in unspecified toe(s): Secondary | ICD-10-CM

## 2016-08-15 DIAGNOSIS — M722 Plantar fascial fibromatosis: Secondary | ICD-10-CM | POA: Diagnosis not present

## 2016-08-15 DIAGNOSIS — B351 Tinea unguium: Secondary | ICD-10-CM | POA: Diagnosis not present

## 2016-08-15 DIAGNOSIS — Q828 Other specified congenital malformations of skin: Secondary | ICD-10-CM | POA: Diagnosis not present

## 2016-08-15 NOTE — Progress Notes (Signed)
She presents today with a chief complaint of painful plantar fasciitis. She states this started in the mid arch area as she points.  She's also complaining of painful toenails and calluses.  Objective: Vital signs are stable she is alert and oriented 3. Pulses are palpable. Feet and skinny bony little feet with pain on palpation to the lesser metatarsals as well as the plantar fascia central band in the medial longitudinal arch. Her toenails are thick yellow dystrophic with mycotic multiple areas of porokeratosis.  Assessment: Plantar fasciitis left. Porokeratosis bilateral. Hammertoe deformities with pain in limb singular onychomycosis bilateral.  Plan: Debridement of all reactive hyperkeratosis bilateral debridement of all reactive nail growth bilateral and injection of the left foot today with Kenalog and local anesthetic follow-up with her as needed.

## 2016-10-17 ENCOUNTER — Encounter: Payer: Self-pay | Admitting: Podiatry

## 2016-10-17 ENCOUNTER — Ambulatory Visit (INDEPENDENT_AMBULATORY_CARE_PROVIDER_SITE_OTHER): Payer: Medicare HMO | Admitting: Podiatry

## 2016-10-17 DIAGNOSIS — B351 Tinea unguium: Secondary | ICD-10-CM

## 2016-10-17 DIAGNOSIS — Q828 Other specified congenital malformations of skin: Secondary | ICD-10-CM | POA: Diagnosis not present

## 2016-10-17 DIAGNOSIS — M79676 Pain in unspecified toe(s): Secondary | ICD-10-CM | POA: Diagnosis not present

## 2016-10-17 DIAGNOSIS — M722 Plantar fascial fibromatosis: Secondary | ICD-10-CM | POA: Diagnosis not present

## 2016-10-17 NOTE — Progress Notes (Signed)
She presents today complaining of painful elongated toenails and painful heels bilaterally.  Objective: Signs are stable she is alert and oriented 3 pulses are palpable no apparent distress. She has pain on palpation medially continue tubercles bilateral. Toenails are long thick yellow dystrophic onychomycotic.  Assessment: Pain limb secondary to onychomycosis and plantar fasciitis bilateral.  Plan: Debridement of toenails 1 through 5 bilateral. Reinjected the bilateral heels today with Kenalog and local anesthetic. Follow-up with her in the near future.

## 2016-10-31 DIAGNOSIS — Z1231 Encounter for screening mammogram for malignant neoplasm of breast: Secondary | ICD-10-CM | POA: Diagnosis not present

## 2016-11-02 DIAGNOSIS — N6001 Solitary cyst of right breast: Secondary | ICD-10-CM | POA: Diagnosis not present

## 2016-11-02 DIAGNOSIS — R922 Inconclusive mammogram: Secondary | ICD-10-CM | POA: Diagnosis not present

## 2016-11-16 ENCOUNTER — Other Ambulatory Visit: Payer: Self-pay | Admitting: *Deleted

## 2016-11-16 MED ORDER — GABAPENTIN 300 MG PO CAPS: ORAL_CAPSULE | ORAL | 3 refills | Status: DC

## 2016-12-19 ENCOUNTER — Ambulatory Visit (INDEPENDENT_AMBULATORY_CARE_PROVIDER_SITE_OTHER): Payer: Medicare HMO | Admitting: Podiatry

## 2016-12-19 ENCOUNTER — Other Ambulatory Visit: Payer: Self-pay | Admitting: Family Medicine

## 2016-12-19 ENCOUNTER — Encounter: Payer: Self-pay | Admitting: Podiatry

## 2016-12-19 ENCOUNTER — Telehealth: Payer: Self-pay | Admitting: *Deleted

## 2016-12-19 DIAGNOSIS — B351 Tinea unguium: Secondary | ICD-10-CM | POA: Diagnosis not present

## 2016-12-19 DIAGNOSIS — Q828 Other specified congenital malformations of skin: Secondary | ICD-10-CM

## 2016-12-19 DIAGNOSIS — M79676 Pain in unspecified toe(s): Secondary | ICD-10-CM | POA: Diagnosis not present

## 2016-12-19 MED ORDER — GABAPENTIN 300 MG PO CAPS: 900.0000 mg | ORAL_CAPSULE | Freq: Three times a day (TID) | ORAL | 3 refills | Status: DC

## 2016-12-19 NOTE — Telephone Encounter (Signed)
Perfect! Thanks [name].  Pt was called and informed.  Amy Whitehead, Amy Whitehead, CMA

## 2016-12-19 NOTE — Telephone Encounter (Signed)
I fixed the e-prescribing issue for her gabapentin with IT's help, so [name]'s new prescription for 3 pills TID should be sent to her pharmacy.  Thank you!  (Sorry for any duplicate messages you received, I'm still trying to figure out Epic.)

## 2016-12-19 NOTE — Telephone Encounter (Signed)
Hi Amy Whitehead,  I have been trying to write a new gabapentin script for her but I have been unable to e-prescribe the medication.  I have already called IT about this a few weeks ago and thought I had figured it out, but my previous solution did not work this time.  If you can send me the refill request, that usually works.  Until then, I will call IT again to continue working on this issue.  Thanks for your help.

## 2016-12-19 NOTE — Telephone Encounter (Signed)
Pt calls nurse line several times.   She is upset because "Dr. Caroleen Hammanumley needs to call [facility] so I can get my gabapentin" she also mentions something about 288 instead of 360.  Called and spoke with [facility].  The issue is that [facility] will not pay for #360 gabapentin a month, they only pay for 9 per day.  This means her script would have to change to 3 capsules 3 times a day.   Left brief message on VM informing patient.  Will forward to MD  Salimah Martinovich, Maryjo RochesterJessica Dawn, CMA

## 2016-12-19 NOTE — Telephone Encounter (Signed)
Spoke with patient, she is ok with decreasing her amount from 4 pills 3x/day to 3 pills 3x/day so that the insurance will cover it.  She would like a new script sent in and to be informed when it is. Milas Gain. Avah Bashor, Maryjo RochesterJessica Dawn, CMA

## 2016-12-20 NOTE — Progress Notes (Signed)
She presents today chief complaint of painful lesions and toenails.  Objective: Vital signs are stable she is alert and oriented 3. Porokeratosis are noted to the plantar aspect of bilateral foot no open lesions or wounds are noted. Toenails are long thick yellow dystrophic and clinically mycotic.  Assessment: Pain in limb secondary to porokeratosis and painful elongated toenails.  Plan: Debridement of toenails 1 through 5 bilateral. Debridement of painful soft tissue lesions plantar aspect of the bilateral foot.

## 2016-12-29 DIAGNOSIS — H524 Presbyopia: Secondary | ICD-10-CM | POA: Diagnosis not present

## 2017-02-22 ENCOUNTER — Encounter: Payer: Self-pay | Admitting: Podiatry

## 2017-02-22 ENCOUNTER — Ambulatory Visit: Payer: Medicare HMO | Admitting: Podiatry

## 2017-02-22 DIAGNOSIS — B351 Tinea unguium: Secondary | ICD-10-CM | POA: Diagnosis not present

## 2017-02-22 DIAGNOSIS — M722 Plantar fascial fibromatosis: Secondary | ICD-10-CM

## 2017-02-22 DIAGNOSIS — M79676 Pain in unspecified toe(s): Secondary | ICD-10-CM

## 2017-02-22 NOTE — Progress Notes (Signed)
She presents today chief complaint of pain to her left heel.  She states that her toenails are slightly elongated though she is recently had a nail trim.  She also states that her insurance is no longer painful calluses so we are not to trim calluses.  Objective: Toenails are long thick yellow dystrophic-like mycotic she has pain on palpation left heel.  Assessment: Plantar fasciitis left heel.  Painful onychomycosis.  Plan: Debridement of toenails 1 through 5 bilateral.  Ejected the left heel today after sterile Betadine skin prep with 10 mg of Kenalog and 2-1/2 mg of Marcaine.  Follow-up with her in 3 months.

## 2017-04-16 ENCOUNTER — Other Ambulatory Visit: Payer: Self-pay

## 2017-04-16 MED ORDER — GABAPENTIN 300 MG PO CAPS: ORAL_CAPSULE | ORAL | 3 refills | Status: DC

## 2017-04-26 ENCOUNTER — Encounter: Payer: Self-pay | Admitting: Podiatry

## 2017-04-26 ENCOUNTER — Ambulatory Visit (INDEPENDENT_AMBULATORY_CARE_PROVIDER_SITE_OTHER): Payer: Medicare HMO | Admitting: Podiatry

## 2017-04-26 DIAGNOSIS — M722 Plantar fascial fibromatosis: Secondary | ICD-10-CM

## 2017-04-26 DIAGNOSIS — M79676 Pain in unspecified toe(s): Secondary | ICD-10-CM

## 2017-04-26 DIAGNOSIS — B351 Tinea unguium: Secondary | ICD-10-CM | POA: Diagnosis not present

## 2017-04-26 NOTE — Progress Notes (Signed)
She presents today chief complaint of right heel pain and elongated toenails.  Objective: Vital signs are stable she is alert and oriented x3 his pain on palpation medial calcaneal tubercle of her right heel.  Her toenails are long thick yellow dystrophic-like mycotic.  Assessment: Plantar fasciitis right heel.  Pain in limb secondary onychomycosis.  Plan: After sterile Betadine skin prep injected 10 mg of Kenalog and 5 mg of Marcaine to the point of maximal tenderness of her right heel after sterile Betadine skin prep.  Also debrided her toenails 1 through 5 bilateral cover service secondary to pain.

## 2017-05-14 ENCOUNTER — Other Ambulatory Visit: Payer: Self-pay

## 2017-05-14 ENCOUNTER — Encounter: Payer: Self-pay | Admitting: Family Medicine

## 2017-05-14 ENCOUNTER — Ambulatory Visit (INDEPENDENT_AMBULATORY_CARE_PROVIDER_SITE_OTHER): Payer: Medicare HMO | Admitting: Family Medicine

## 2017-05-14 VITALS — BP 102/56 | HR 62 | Temp 97.9°F | Ht 67.0 in | Wt 142.4 lb

## 2017-05-14 DIAGNOSIS — Z23 Encounter for immunization: Secondary | ICD-10-CM | POA: Diagnosis not present

## 2017-05-14 DIAGNOSIS — I2510 Atherosclerotic heart disease of native coronary artery without angina pectoris: Secondary | ICD-10-CM | POA: Diagnosis not present

## 2017-05-14 DIAGNOSIS — Z0000 Encounter for general adult medical examination without abnormal findings: Secondary | ICD-10-CM

## 2017-05-14 DIAGNOSIS — Z791 Long term (current) use of non-steroidal anti-inflammatories (NSAID): Secondary | ICD-10-CM | POA: Diagnosis not present

## 2017-05-14 MED ORDER — MYRBETRIQ 25 MG PO TB24: 25.0000 mg | ORAL_TABLET | Freq: Every day | ORAL | 11 refills | Status: DC

## 2017-05-14 NOTE — Patient Instructions (Addendum)
It was nice meeting you today Ms. Mustin!  Today, we performed a physical, gave you the second pneumonia shot, checked your lipids and electrolytes, and refilled your Myrbetriq.  I also started the process for refilling your gabapentin.  I will call you if any of your labs are not normal.  If you are interested in a Medicare annual wellness visit, please make an appointment up front.  I would like to see you again in about one year for your annual physical.  If you have any questions or concerns, please feel free to call the clinic.   Be well,  [name]

## 2017-05-14 NOTE — Assessment & Plan Note (Signed)
Will obtain BMP due to elevated creatinine in 2017 and daily Aleve use and lipids since patient would be interested in taking a statin to decrease her risk of heart attack and stroke if her lipids are elevated.  Patient was counseled on reducing Aleve use if possible.  Will refill Myrbetriq and contact [employer] regarding patient's gabapentin prescription.  Gave PPSV23 today.  Recommended annual wellness visit for Medicare.

## 2017-05-14 NOTE — Progress Notes (Signed)
   Subjective:    Amy Whitehead - 84 y.o. female MRN [id number]  Date of birth: 04/04/1933  HPI  Amy Whitehead is here for medication refills and health maintenance. - would like refills of Myrbetriq and gabapentin - takes gabapetin 1200 mg TID (max dose) for postherpetic neuralgia - this medication has been denied by [facility] because it is above their quantity limit - patient continues to need this medication to alleviate her postherpetic neuralgia of her left torso and leg - takes Vit D3, Ca, multivitamin daily - takes aleve daily for osteoarthritis of hands - would be interested in taking a statin if lipids are elevated, saying "I want to outlive my dog"  Health Maintenance:  - patient interested in receiving PPSV23 today Health Maintenance Due  Topic Date Due  . PNA vac Low Risk Adult (2 of 2 - PPSV23) 03/11/2016    -  reports that she quit smoking about 55 years ago. Her smoking use included cigarettes. she has never used smokeless tobacco. - Review of Systems: Per HPI. - Past Medical History: Patient Active Problem List   Diagnosis Date Noted  . Nocturia 03/12/2015  . Varicose veins 07/18/2014  . Plantar fasciitis of left foot 04/17/2011  . Post herpetic neuralgia 04/17/2011  . Health care maintenance 04/17/2011   - Medications: reviewed and updated   Objective:   Physical Exam BP (!) 102/56   Pulse 62   Temp 97.9 F (36.6 C) (Oral)   Ht 5\' 7"  (1.702 m)   Wt 142 lb 6.4 oz (64.6 kg)   SpO2 96%   BMI 22.30 kg/m  Gen: NAD, alert, cooperative with exam, well-appearing HEENT: NCAT, PERRL, clear conjunctiva, oropharynx clear, supple neck, no lymphadenopathy, upper set of dentures in place CV: RRR, good S1/S2, no murmur, no edema Resp: CTABL, no wheezes, non-labored Abd: SNTND, BS present, no guarding or organomegaly Skin: no rashes, normal turgor  Neuro: no gross deficits Psych: good insight, alert and oriented        Assessment & Plan:   Health care  maintenance Will obtain BMP due to elevated creatinine in 2017 and daily Aleve use and lipids since patient would be interested in taking a statin to decrease her risk of heart attack and stroke if her lipids are elevated.  Patient was counseled on reducing Aleve use if possible.  Will refill Myrbetriq and contact [employer] regarding patient's gabapentin prescription.  Gave PPSV23 today.  Recommended annual wellness visit for Medicare.    Lezlie OctaveAmanda Vianka Ertel, M.D. 05/14/2017, 9:54 AM PGY-1, [facility]

## 2017-05-15 ENCOUNTER — Telehealth: Payer: Self-pay

## 2017-05-15 ENCOUNTER — Other Ambulatory Visit: Payer: Self-pay | Admitting: Family Medicine

## 2017-05-15 LAB — BASIC METABOLIC PANEL
BUN/Creatinine Ratio: 21 (ref 12–28)
BUN: 17 mg/dL (ref 8–27)
CO2: 26 mmol/L (ref 20–29)
Calcium: 9.5 mg/dL (ref 8.7–10.3)
Chloride: 103 mmol/L (ref 96–106)
Creatinine, Ser: 0.81 mg/dL (ref 0.57–1.00)
GFR calc Af Amer: 79 mL/min/{1.73_m2} (ref >59)
GFR calc non Af Amer: 68 mL/min/{1.73_m2} (ref >59)
Glucose: 74 mg/dL (ref 65–99)
Potassium: 4.5 mmol/L (ref 3.5–5.2)
Sodium: 143 mmol/L (ref 134–144)

## 2017-05-15 LAB — LIPID PANEL
Chol/HDL Ratio: 2.6 ratio (ref 0.0–4.4)
Cholesterol, Total: 184 mg/dL (ref 100–199)
HDL: 72 mg/dL (ref >39)
LDL Calculated: 98 mg/dL (ref 0–99)
Triglycerides: 69 mg/dL (ref 0–149)
VLDL Cholesterol Cal: 14 mg/dL (ref 5–40)

## 2017-05-15 MED ORDER — GABAPENTIN 300 MG PO CAPS: 900.0000 mg | ORAL_CAPSULE | Freq: Three times a day (TID) | ORAL | 0 refills | Status: DC

## 2017-05-15 NOTE — Telephone Encounter (Signed)
-----   Message from Lennox SoldersAmanda C Winfrey, MD sent at 05/15/2017  8:30 AM EDT ----- Regarding: Exception for gabapentin coverage  Desma MaximHey Alessandria Henken,    I am trying to get an exception for Mattye's gabapentin to be covered by [facility].  She currently takes 1200 mg TID for her postherpetic neuralgia.  I really appreciate your help with this, and I am happy to do whatever needs to be done to get this medication covered for her - just let me know what you find out.    Thanks again, Allstatemanda

## 2017-05-15 NOTE — Telephone Encounter (Signed)
[name], I initiated a prior authorization for gabapentin, and the result was a PA was not needed.Has the pharmacy told us she needs a prior authorization? Also her chart p

## 2017-05-21 DIAGNOSIS — N3941 Urge incontinence: Secondary | ICD-10-CM | POA: Diagnosis not present

## 2017-05-30 DIAGNOSIS — M79605 Pain in left leg: Secondary | ICD-10-CM | POA: Diagnosis not present

## 2017-05-30 DIAGNOSIS — M79652 Pain in left thigh: Secondary | ICD-10-CM | POA: Diagnosis not present

## 2017-06-18 ENCOUNTER — Other Ambulatory Visit: Payer: Self-pay

## 2017-06-19 ENCOUNTER — Other Ambulatory Visit: Payer: Self-pay | Admitting: Family Medicine

## 2017-06-19 MED ORDER — GABAPENTIN 300 MG PO CAPS: 900.0000 mg | ORAL_CAPSULE | Freq: Three times a day (TID) | ORAL | 0 refills | Status: DC

## 2017-06-19 NOTE — Telephone Encounter (Signed)
Pt calling about refill request, she is out of her gabapentin. Her call back [phone] Shawna OrleansMeredith B Thomsen, RN

## 2017-06-19 NOTE — Telephone Encounter (Signed)
Her Gabapentin refill request was faxed over here but she is still unable to get it filled.  [facility] [place] is where it needs to go.  She needs asap as she is out now, thanks.

## 2017-06-19 NOTE — Telephone Encounter (Signed)
LVM to call office back to inform her that I did contact [facility] and they do have her Rx.. They said that they would be working on it and it should be ready in about one hour.  If pt calls back please inform her of this and tell her to call before she goes to get it to be sure it is ready. Amy Whitehead, Brodan Grewell D, [place]

## 2017-06-26 ENCOUNTER — Encounter: Payer: Self-pay | Admitting: Podiatry

## 2017-06-26 ENCOUNTER — Ambulatory Visit: Payer: Medicare HMO | Admitting: Podiatry

## 2017-06-26 DIAGNOSIS — M79676 Pain in unspecified toe(s): Secondary | ICD-10-CM

## 2017-06-26 DIAGNOSIS — B351 Tinea unguium: Secondary | ICD-10-CM | POA: Diagnosis not present

## 2017-06-26 DIAGNOSIS — M722 Plantar fascial fibromatosis: Secondary | ICD-10-CM | POA: Diagnosis not present

## 2017-06-26 NOTE — Progress Notes (Signed)
She presents today chief complaint of painful elongated toenails and a painful left heel.  She states that the injection will put in the last time helped to some degree.  She states that it slowly seems to be coming back.  Objective: Vital signs are stable she is alert and oriented x3.  Pulses are palpable.  There is no erythema cellulitis drainage or odor.  Toenails are long thick yellow dystrophic like mycotic she has pain to palpation medial calcaneal tubercle of her left heel she has multiple calluses plantar aspect of bilateral foot.  Assessment: Plantar limb secondary to onychomycosis or keratosis and calluses.  She also has pain in limb secondary to plantar fasciitis left.  Plan: Discussed etiology pathology conservative or surgical therapies I injected the left heel today with 20 mg Kenalog 5 mg Marcaine after sterile Betadine skin prep to the point of maximal tenderness the plantar fascial insertion site.  She tolerated procedure well without complications.  3-10 nails 1 through 5 bilaterally.  Debrided all reactive hyperkeratoses.  Follow-up with her in 2 months.  She will call with questions or concerns.

## 2017-07-20 ENCOUNTER — Other Ambulatory Visit: Payer: Self-pay | Admitting: Family Medicine

## 2017-07-20 ENCOUNTER — Other Ambulatory Visit: Payer: Self-pay

## 2017-07-20 MED ORDER — GABAPENTIN 300 MG PO CAPS: 900.0000 mg | ORAL_CAPSULE | Freq: Three times a day (TID) | ORAL | 0 refills | Status: DC

## 2017-08-17 ENCOUNTER — Other Ambulatory Visit: Payer: Self-pay | Admitting: Family Medicine

## 2017-08-28 ENCOUNTER — Ambulatory Visit: Payer: Medicare HMO | Admitting: Podiatry

## 2017-08-28 DIAGNOSIS — M79676 Pain in unspecified toe(s): Secondary | ICD-10-CM | POA: Diagnosis not present

## 2017-08-28 DIAGNOSIS — B351 Tinea unguium: Secondary | ICD-10-CM | POA: Diagnosis not present

## 2017-08-28 DIAGNOSIS — M722 Plantar fascial fibromatosis: Secondary | ICD-10-CM | POA: Diagnosis not present

## 2017-08-28 NOTE — Progress Notes (Signed)
She presents today chief complaint of painful elongated toenails and bilateral painful heels.  She states that I would like to have injections again if I could.  Objective: Vital signs are stable alert and oriented x3.  Pulses are palpable.  Neurologic sensorium is intact.  Hammertoe deformities with thick yellow dystrophic-like mycotic nails.  Pain on palpation medial calcaneal tubercles bilateral.  No open lesions or wounds are noted.  Assessment: Pain in limb secondary to onychomycosis and plantar fasciitis bilateral.  Plan: After sterile Betadine skin prep I injected 10 mg of Kenalog and 5 mg of Marcaine to the medial aspect of the bilateral heel.  She tolerated procedure well without complications.  Discussed appropriate shoe gear stretching exercise ice therapy sugar modifications.  Debrided toenails 1 through 5 bilateral.

## 2017-10-17 ENCOUNTER — Other Ambulatory Visit: Payer: Self-pay | Admitting: Family Medicine

## 2017-11-01 DIAGNOSIS — Z1231 Encounter for screening mammogram for malignant neoplasm of breast: Secondary | ICD-10-CM | POA: Diagnosis not present

## 2017-11-08 ENCOUNTER — Encounter: Payer: Self-pay | Admitting: Family Medicine

## 2017-11-16 ENCOUNTER — Other Ambulatory Visit: Payer: Self-pay | Admitting: Family Medicine

## 2017-11-16 NOTE — Telephone Encounter (Signed)
Called in per Dr Frances FurbishWinfrey. Ples SpecterAlisa Brake, RN [facility]) -

## 2017-11-29 ENCOUNTER — Ambulatory Visit: Payer: Medicare HMO | Admitting: Podiatry

## 2017-11-29 ENCOUNTER — Encounter: Payer: Self-pay | Admitting: Podiatry

## 2017-11-29 DIAGNOSIS — B351 Tinea unguium: Secondary | ICD-10-CM | POA: Diagnosis not present

## 2017-11-29 DIAGNOSIS — Q828 Other specified congenital malformations of skin: Secondary | ICD-10-CM

## 2017-11-29 DIAGNOSIS — M722 Plantar fascial fibromatosis: Secondary | ICD-10-CM

## 2017-11-29 DIAGNOSIS — M79676 Pain in unspecified toe(s): Secondary | ICD-10-CM | POA: Diagnosis not present

## 2017-11-29 NOTE — Progress Notes (Signed)
She presents today chief complaint of painful elongated toenails and calluses.  Objective: Pulses are palpable.  Toenails are long thick yellow dystrophic onychomycotic reactive hyper keratomas plantar aspect of bilateral foot.  Assessment: Pain in limb secondary to onychomycosis and tylomas.  Plan: Debridement of toenails and tylomas bilateral.

## 2017-12-20 ENCOUNTER — Ambulatory Visit (INDEPENDENT_AMBULATORY_CARE_PROVIDER_SITE_OTHER): Payer: Medicare HMO

## 2017-12-20 VITALS — BP 110/60 | HR 55 | Temp 97.3°F | Ht 67.0 in | Wt 138.6 lb

## 2017-12-20 DIAGNOSIS — Z23 Encounter for immunization: Secondary | ICD-10-CM

## 2017-12-20 DIAGNOSIS — Z0000 Encounter for general adult medical examination without abnormal findings: Secondary | ICD-10-CM

## 2017-12-20 NOTE — Patient Instructions (Addendum)
Amy Whitehead , Thank you for taking time to come for your Medicare Wellness Visit. I appreciate your ongoing commitment to your health goals. Please review the following plan we discussed and let me know if I can assist you in the future.   We updated your flu shot today. Please call in January for an appt in March with Dr Frances Furbish.  These are the goals we discussed: Goals    . LDL CALC < 100       This is a list of the screening recommended for you and due dates:  Health Maintenance  Topic Date Due  . Tetanus Vaccine  10/13/2025  . Flu Shot  Completed  . DEXA scan (bone density measurement)  Completed  . Pneumonia vaccines  Completed   Fall Prevention in the Home Falls can cause injuries. They can happen to people of all ages. There are many things you can do to make your home safe and to help prevent falls. What can I do on the outside of my home?  Regularly fix the edges of walkways and driveways and fix any cracks.  Remove anything that might make you trip as you walk through a door, such as a raised step or threshold.  Trim any bushes or trees on the path to your home.  Use bright outdoor lighting.  Clear any walking paths of anything that might make someone trip, such as rocks or tools.  Regularly check to see if handrails are loose or broken. Make sure that both sides of any steps have handrails.  Any raised decks and porches should have guardrails on the edges.  Have any leaves, snow, or ice cleared regularly.  Use sand or salt on walking paths during winter.  Clean up any spills in your garage right away. This includes oil or grease spills. What can I do in the bathroom?  Use night lights.  Install grab bars by the toilet and in the tub and shower. Do not use towel bars as grab bars.  Use non-skid mats or decals in the tub or shower.  If you need to sit down in the shower, use a plastic, non-slip stool.  Keep the floor dry. Clean up any water that spills on  the floor as soon as it happens.  Remove soap buildup in the tub or shower regularly.  Attach bath mats securely with double-sided non-slip rug tape.  Do not have throw rugs and other things on the floor that can make you trip. What can I do in the bedroom?  Use night lights.  Make sure that you have a light by your bed that is easy to reach.  Do not use any sheets or blankets that are too big for your bed. They should not hang down onto the floor.  Have a firm chair that has side arms. You can use this for support while you get dressed.  Do not have throw rugs and other things on the floor that can make you trip. What can I do in the kitchen?  Clean up any spills right away.  Avoid walking on wet floors.  Keep items that you use a lot in easy-to-reach places.  If you need to reach something above you, use a strong step stool that has a grab bar.  Keep electrical cords out of the way.  Do not use floor polish or wax that makes floors slippery. If you must use wax, use non-skid floor wax.  Do not have throw rugs  and other things on the floor that can make you trip. What can I do with my stairs?  Do not leave any items on the stairs.  Make sure that there are handrails on both sides of the stairs and use them. Fix handrails that are broken or loose. Make sure that handrails are as long as the stairways.  Check any carpeting to make sure that it is firmly attached to the stairs. Fix any carpet that is loose or worn.  Avoid having throw rugs at the top or bottom of the stairs. If you do have throw rugs, attach them to the floor with carpet tape.  Make sure that you have a light switch at the top of the stairs and the bottom of the stairs. If you do not have them, ask someone to add them for you. What else can I do to help prevent falls?  Wear shoes that: ? Do not have high heels. ? Have rubber bottoms. ? Are comfortable and fit you well. ? Are closed at the toe. Do not  wear sandals.  If you use a stepladder: ? Make sure that it is fully opened. Do not climb a closed stepladder. ? Make sure that both sides of the stepladder are locked into place. ? Ask someone to hold it for you, if possible.  Clearly mark and make sure that you can see: ? Any grab bars or handrails. ? First and last steps. ? Where the edge of each step is.  Use tools that help you move around (mobility aids) if they are needed. These include: ? Canes. ? Walkers. ? Scooters. ? Crutches.  Turn on the lights when you go into a dark area. Replace any light bulbs as soon as they burn out.  Set up your furniture so you have a clear path. Avoid moving your furniture around.  If any of your floors are uneven, fix them.  If there are any pets around you, be aware of where they are.  Review your medicines with your doctor. Some medicines can make you feel dizzy. This can increase your chance of falling. Ask your doctor what other things that you can do to help prevent falls. This information is not intended to replace advice given to you by your health care provider. Make sure you discuss any questions you have with your health care provider. Document Released: 12/10/2008 Document Revised: 07/22/2015 Document Reviewed: 03/20/2014 Elsevier Interactive Patient Education  [employer].

## 2017-12-20 NOTE — Progress Notes (Addendum)
Subjective:   Amy Whitehead is a 84 y.o. female who presents for Medicare Annual (Subsequent) preventive examination. The patient was informed that the wellness visit is to identify future health risk and educate and initiate measures that can reduce risk for increased disease through the lifespan.   Review of Systems:  Physical assessment deferred to PCP.      Objective:    Vitals: BP 110/60   Pulse (!) 55   Temp (!) 97.3 F (36.3 C) (Oral)   Ht 5\' 7"  (1.702 m)   Wt 138 lb 9.6 oz (62.9 kg)   SpO2 95%   BMI 21.71 kg/m   Body mass index is 21.71 kg/m.  Advanced Directives 12/20/2017 05/14/2017 10/14/2015 03/11/2015 10/12/2014 07/15/2014 06/03/2014  Does Patient Have a Medical Advance Directive? Yes No No No No No No  Type of Estate agent of [place];Living will - - - - - -  Copy of Healthcare Power of Attorney in Chart? No - copy requested - - - - - -  Would patient like information on creating a medical advance directive? - No - Patient declined Yes - Educational materials given No - patient declined information No - patient declined information No - patient declined information No - patient declined information    Tobacco Social History   Tobacco Use  Smoking Status Former Smoker  . Types: Cigarettes  . Last attempt to quit: 04/16/1962  . Years since quitting: 55.7  Smokeless Tobacco Never Used  Tobacco Comment   No plans to start again     Counseling given: Not Answered Comment: No plans to start again   Clinical Intake:  Pre-visit preparation completed: Yes  Pain : No/denies pain    Nutritional Status: BMI of 19-24  Normal Nutritional Risks: Other (Comment) Diabetes: No  How often do you need to have someone help you when you read instructions, pamphlets, or other written materials from your doctor or pharmacy?: 1 - Never What is the last grade level you completed in school?: 12th grade  Interpreter Needed?: No    Past Medical History:   Diagnosis Date  . Arthritis   . Cataract   . Post herpetic neuralgia 04/17/2011   Past Surgical History:  Procedure Laterality Date  . 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  Unsure if uterus was removed, but menses stopped after surgery   Family History  Problem Relation Age of Onset  . Cancer Father        brain tumor  . Cancer Sister        lung  . Diabetes Sister    Social History   Socioeconomic History  . Marital status: Widowed    Spouse name: Not on file  . Number of children: 3  . Years of education: 44  . Highest education level: 12th grade  Occupational History  . Occupation: order entry/ key punch  Social Needs  . Financial resource strain: Not hard at all  . Food insecurity:    Worry: Never true    Inability: Never true  . Transportation needs:    Medical: No    Non-medical: No  Tobacco Use  . Smoking status: Former Smoker    Types: Cigarettes    Last attempt to quit: 04/16/1962    Years since quitting: 55.7  . Smokeless tobacco: Never Used  . Tobacco comment: No plans to start again  Substance and  Sexual Activity  . Alcohol use: No  . Drug use: No  . Sexual activity: Never  Lifestyle  . Physical activity:    Days per week: 7 days    Minutes per session: 40 min  . Stress: Not at all  Relationships  . Social connections:    Talks on phone: Once a week    Gets together: Once a week    Attends religious service: More than 4 times per year    Active member of club or organization: No    Attends meetings of clubs or organizations: Never    Relationship status: Widowed  Other Topics Concern  . Not on file  Social History Narrative   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 High school graduate.  Lives alone. Has one dog, a Boston terrier named Galatia. Walks daily for about an hour. Has 2 sons (one in [place], one in [place]), strained relationship with them.  Has a  daughter and 2 grandchildren, age 57 and 56, who live in [place] area.  Strained relationship with son-in-law, who smokes and drinks.  Single, but helps a friend who currently has dementia.  Lives alone.  Drives.  Exercises by walking, especially when weather is nice.      Lives in [place]. 2nd level. No problems with the stairs. No throw rugs. Smoke alarms. Wears seat belts in the car.       Diet: eats fruit, vegetables, and some meat.    Outpatient Encounter Medications as of 12/20/2017  Medication Sig  . aspirin EC 81 MG tablet Take 81 mg by mouth daily.  . Calcium-Vitamin D (CALTRATE 600 PLUS-VIT D PO) Take 2 tablets by mouth 2 (two) times daily.  Marland Kitchen gabapentin (NEURONTIN) 300 MG capsule Take 3 capsules (900 mg total) by mouth 3 (three) times daily.  . Multiple Vitamins-Minerals (MULTIVITAMIN WITH MINERALS) tablet Take 1 tablet by mouth daily.  . Multiple Vitamins-Minerals (VISION-VITE PRESERVE PO) Take 2 tablets by mouth daily.  Marland Kitchen MYRBETRIQ 25 MG TB24 tablet Take 1 tablet (25 mg total) by mouth daily.  . Naproxen Sodium (ALEVE PO) Take by mouth.   No facility-administered encounter medications on file as of 12/20/2017.     Activities of Daily Living In your present state of health, do you have any difficulty performing the following activities: 12/20/2017  Hearing? N  Vision? N  Difficulty concentrating or making decisions? N  Walking or climbing stairs? N  Dressing or bathing? N  Doing errands, shopping? N  Preparing Food and eating ? N  Using the Toilet? N  In the past six months, have you accidently leaked urine? N  Do you have problems with loss of bowel control? N  Managing your Medications? N  Managing your Finances? N  Housekeeping or managing your Housekeeping? N  Some recent data might be hidden    Patient Care Team: Lennox Solders, MD as PCP - General (Family Medicine) Glenford Peers, OD as Referring Physician (Optometry) [place], Annye Rusk, [place] as Consulting  Physician (Podiatry) Jerilee Field, MD as Consulting Physician (Urology)    Assessment:   This is a routine wellness examination for [facility].  Exercise Activities and Dietary recommendations    Goals    . LDL CALC < 100       Fall Risk Fall Risk  12/20/2017 05/14/2017 10/14/2015 07/15/2014 06/03/2014  Falls in the past year? No No Yes No No  Number falls in past yr: - - 1 - -  Injury with Fall? - - No - -  Risk for fall due to : - - Other (Comment) - -  Risk for fall due to: Comment - - tripped over dog - -   Is the patient's home free of loose throw rugs in walkways, pet beds, electrical cords, etc?   yes      Grab bars in the bathroom? yes      Handrails on the stairs?   yes      Adequate lighting?   yes  Depression Screen PHQ 2/9 Scores 12/20/2017 05/14/2017 10/14/2015 03/11/2015  PHQ - 2 Score 0 0 0 0     Cognitive Function MMSE - Mini Mental State Exam 12/20/2017  Orientation to time 5  Orientation to Place 5  Registration 3  Attention/ Calculation 5  Recall 3  Language- name 2 objects 2  Language- repeat 1  Language- follow 3 step command 3  Language- read & follow direction 1  Write a sentence 1  Copy design 1  Total score 30     6CIT Screen 12/20/2017  What Year? 0 points  What month? 0 points  What time? 0 points  Count back from 20 0 points  Months in reverse 0 points  Repeat phrase 0 points  Total Score 0    Immunization History  Administered Date(s) Administered  . Influenza,inj,Quad PF,6+ Mos 05/15/2013, 11/28/2013, 03/12/2015, 01/03/2016, 12/20/2017  . Influenza-Unspecified 11/24/2016  . Pneumococcal Conjugate-13 03/12/2015  . Pneumococcal Polysaccharide-23 05/14/2017  . Tdap 10/14/2015     Screening Tests Health Maintenance  Topic Date Due  . TETANUS/TDAP  10/13/2025  . INFLUENZA VACCINE  Completed  . DEXA SCAN  Completed  . PNA vac Low Risk Adult  Completed    Cancer Screenings: Lung: Low Dose CT Chest recommended if Age 71-80  years, 30 pack-year currently smoking OR have quit w/in 15years. Patient does not qualify. Breast:  Up to date on Mammogram? Yes   Up to date of Bone Density/Dexa? Yes Colorectal: yes   Additional Screenings: : Hepatitis C Screening: N/A     Plan:  Flu vaccine updated today. All health maintenance items up to date. Patient will call in January for f/u with PCP in March.   I have personally reviewed and noted the following in the patient's chart:   . Medical and social history . Use of alcohol, tobacco or illicit drugs  . Current medications and supplements . Functional ability and status . Nutritional status . Physical activity . Advanced directives . List of other physicians . Hospitalizations, surgeries, and ER visits in previous 12 months . Vitals . Screenings to include cognitive, depression, and falls . Referrals and appointments  In addition, I have reviewed and discussed with patient certain preventive protocols, quality metrics, and best practice recommendations. A written personalized care plan for preventive services as well as general preventive health recommendations were provided to patient.     Nigel Mormon, RN  12/20/2017    I have reviewed this visit and agree with the documentation.   Amanda C. Frances Furbish, MD PGY-2, [facility] 03/21/2018 1:31 PM

## 2018-01-02 DIAGNOSIS — H5203 Hypermetropia, bilateral: Secondary | ICD-10-CM | POA: Diagnosis not present

## 2018-01-28 ENCOUNTER — Other Ambulatory Visit: Payer: Self-pay

## 2018-01-29 MED ORDER — GABAPENTIN 300 MG PO CAPS: 900.0000 mg | ORAL_CAPSULE | Freq: Three times a day (TID) | ORAL | 0 refills | Status: DC

## 2018-03-05 ENCOUNTER — Ambulatory Visit: Payer: Medicare HMO | Admitting: Podiatry

## 2018-03-05 ENCOUNTER — Encounter: Payer: Self-pay | Admitting: Podiatry

## 2018-03-05 DIAGNOSIS — B351 Tinea unguium: Secondary | ICD-10-CM | POA: Diagnosis not present

## 2018-03-05 DIAGNOSIS — Q828 Other specified congenital malformations of skin: Secondary | ICD-10-CM

## 2018-03-05 DIAGNOSIS — M722 Plantar fascial fibromatosis: Secondary | ICD-10-CM

## 2018-03-05 DIAGNOSIS — M79676 Pain in unspecified toe(s): Secondary | ICD-10-CM

## 2018-03-05 NOTE — Progress Notes (Signed)
She presents today chief complaint of painful bilateral heels also noted elongated toenails and painful calluses.  Objective: Vital signs are stable she alert oriented x3 pulses are palpable.  No open lesions or wounds distal clavus to the second digit of the right foot medial aspect of the DIPJ second digit right foot no open lesions or wounds are noted.  Otherwise the toenails are long thick yellow dystrophic with mycotic painful palpation.  She also has pain on palpation mid calcaneal tubercles bilaterally.  Assessment: Plantar fasciitis bilateral pain in limb secondary onychomycosis and distal clavus second toe right foot.  Plan: Debridement of all reactive hyperkeratotic tissue debridement of toenails 1 through 5 bilateral.  And reinjected bilateral heels today with 5 mg of Kenalog 5 mg Marcaine point maximal tenderness bilateral heels after Betadine skin prep tolerated procedure well without complications.  Follow-up with her as needed.

## 2018-04-18 ENCOUNTER — Other Ambulatory Visit: Payer: Self-pay | Admitting: Family Medicine

## 2018-05-17 ENCOUNTER — Other Ambulatory Visit: Payer: Self-pay | Admitting: Family Medicine

## 2018-05-24 DIAGNOSIS — N3941 Urge incontinence: Secondary | ICD-10-CM | POA: Diagnosis not present

## 2018-05-24 DIAGNOSIS — R351 Nocturia: Secondary | ICD-10-CM | POA: Diagnosis not present

## 2018-06-04 ENCOUNTER — Other Ambulatory Visit: Payer: Self-pay

## 2018-06-04 ENCOUNTER — Encounter: Payer: Self-pay | Admitting: Podiatry

## 2018-06-04 ENCOUNTER — Ambulatory Visit: Payer: Medicare HMO | Admitting: Podiatry

## 2018-06-04 VITALS — Temp 97.5°F

## 2018-06-04 DIAGNOSIS — M722 Plantar fascial fibromatosis: Secondary | ICD-10-CM

## 2018-06-04 DIAGNOSIS — B351 Tinea unguium: Secondary | ICD-10-CM | POA: Diagnosis not present

## 2018-06-04 DIAGNOSIS — Q828 Other specified congenital malformations of skin: Secondary | ICD-10-CM | POA: Diagnosis not present

## 2018-06-04 DIAGNOSIS — M79676 Pain in unspecified toe(s): Secondary | ICD-10-CM

## 2018-06-04 NOTE — Progress Notes (Signed)
She presents today chief complaint of painful elongated toenails and calluses bilaterally.  She is also complaining of painful heels bilateral.  Objective: Vital signs are stable she alert oriented x3.  Pulses are palpable.  States that her heels are not as painful now as they have been but she would like to have another injection anyway just to make sure they do not become painful over the next few weeks.  She also has thick yellow dystrophic onychomycotic nails which are painful palpation.  Reactive hyper keratomas plantar aspect of the forefoot sub-first and sub-fifth which are not open.  There is no open lesions drainage or odor.  Assessment: Chronic intractable plantar fasciitis bilateral.  Painful elongated toenails 1 through 5 bilateral.  Reactive hyper keratoma sub-fifth sub-first bilateral.  Plan: Discussed etiology pathology and surgical therapies debrided all reactive hyperkeratotic tissue.  Debrided nails 1 through 5 bilateral.  And after sterile Betadine skin prep I injected 10 mg Kenalog 5 mg Marcaine point maximal tenderness bilateral heels.  Tolerated procedure well without complications.

## 2018-06-17 DIAGNOSIS — N3942 Incontinence without sensory awareness: Secondary | ICD-10-CM | POA: Diagnosis not present

## 2018-06-17 DIAGNOSIS — R351 Nocturia: Secondary | ICD-10-CM | POA: Diagnosis not present

## 2018-06-17 DIAGNOSIS — M6281 Muscle weakness (generalized): Secondary | ICD-10-CM | POA: Diagnosis not present

## 2018-06-17 DIAGNOSIS — M62838 Other muscle spasm: Secondary | ICD-10-CM | POA: Diagnosis not present

## 2018-07-02 DIAGNOSIS — R351 Nocturia: Secondary | ICD-10-CM | POA: Diagnosis not present

## 2018-07-02 DIAGNOSIS — M62838 Other muscle spasm: Secondary | ICD-10-CM | POA: Diagnosis not present

## 2018-07-02 DIAGNOSIS — N3942 Incontinence without sensory awareness: Secondary | ICD-10-CM | POA: Diagnosis not present

## 2018-07-02 DIAGNOSIS — M6281 Muscle weakness (generalized): Secondary | ICD-10-CM | POA: Diagnosis not present

## 2018-07-10 DIAGNOSIS — R351 Nocturia: Secondary | ICD-10-CM | POA: Diagnosis not present

## 2018-07-10 DIAGNOSIS — M62838 Other muscle spasm: Secondary | ICD-10-CM | POA: Diagnosis not present

## 2018-07-10 DIAGNOSIS — M6281 Muscle weakness (generalized): Secondary | ICD-10-CM | POA: Diagnosis not present

## 2018-07-10 DIAGNOSIS — N3941 Urge incontinence: Secondary | ICD-10-CM | POA: Diagnosis not present

## 2018-07-10 DIAGNOSIS — N3942 Incontinence without sensory awareness: Secondary | ICD-10-CM | POA: Diagnosis not present

## 2018-07-12 DIAGNOSIS — Z0100 Encounter for examination of eyes and vision without abnormal findings: Secondary | ICD-10-CM | POA: Diagnosis not present

## 2018-07-19 DIAGNOSIS — M6281 Muscle weakness (generalized): Secondary | ICD-10-CM | POA: Diagnosis not present

## 2018-07-19 DIAGNOSIS — N3941 Urge incontinence: Secondary | ICD-10-CM | POA: Diagnosis not present

## 2018-07-19 DIAGNOSIS — M62838 Other muscle spasm: Secondary | ICD-10-CM | POA: Diagnosis not present

## 2018-07-19 DIAGNOSIS — R351 Nocturia: Secondary | ICD-10-CM | POA: Diagnosis not present

## 2018-07-31 DIAGNOSIS — N3942 Incontinence without sensory awareness: Secondary | ICD-10-CM | POA: Diagnosis not present

## 2018-07-31 DIAGNOSIS — M62838 Other muscle spasm: Secondary | ICD-10-CM | POA: Diagnosis not present

## 2018-07-31 DIAGNOSIS — M6281 Muscle weakness (generalized): Secondary | ICD-10-CM | POA: Diagnosis not present

## 2018-07-31 DIAGNOSIS — R351 Nocturia: Secondary | ICD-10-CM | POA: Diagnosis not present

## 2018-08-14 DIAGNOSIS — M6281 Muscle weakness (generalized): Secondary | ICD-10-CM | POA: Diagnosis not present

## 2018-08-14 DIAGNOSIS — N3941 Urge incontinence: Secondary | ICD-10-CM | POA: Diagnosis not present

## 2018-08-14 DIAGNOSIS — R351 Nocturia: Secondary | ICD-10-CM | POA: Diagnosis not present

## 2018-08-14 DIAGNOSIS — M62838 Other muscle spasm: Secondary | ICD-10-CM | POA: Diagnosis not present

## 2018-08-14 DIAGNOSIS — N3942 Incontinence without sensory awareness: Secondary | ICD-10-CM | POA: Diagnosis not present

## 2018-08-20 ENCOUNTER — Other Ambulatory Visit: Payer: Self-pay | Admitting: Family Medicine

## 2018-08-20 NOTE — Telephone Encounter (Signed)
Patient came to office needs refill of her Gabapentin 300 mg.  Prescription needs to be sent to [facility] in [place].  She doesn't have any refill left.  Please call patient so she knows that her prescription was sent to [facility] P#[phone]

## 2018-08-20 NOTE — Telephone Encounter (Signed)
Please let Amy Whitehead know that her gabapentin has been sent to [facility] at [facility].

## 2018-08-21 ENCOUNTER — Telehealth: Payer: Self-pay

## 2018-08-21 NOTE — Telephone Encounter (Signed)
LVM to call office back to inform her of below. April Zimmerman Rumple, CMA  

## 2018-08-21 NOTE — Telephone Encounter (Signed)
Pt returned call, pt informed. Ottis Stain, CMA

## 2018-08-23 DIAGNOSIS — N3942 Incontinence without sensory awareness: Secondary | ICD-10-CM | POA: Diagnosis not present

## 2018-08-23 DIAGNOSIS — M62838 Other muscle spasm: Secondary | ICD-10-CM | POA: Diagnosis not present

## 2018-08-23 DIAGNOSIS — R351 Nocturia: Secondary | ICD-10-CM | POA: Diagnosis not present

## 2018-08-23 DIAGNOSIS — M6281 Muscle weakness (generalized): Secondary | ICD-10-CM | POA: Diagnosis not present

## 2018-08-27 ENCOUNTER — Ambulatory Visit: Payer: Medicare HMO | Admitting: Podiatry

## 2018-08-27 ENCOUNTER — Encounter: Payer: Self-pay | Admitting: Podiatry

## 2018-08-27 ENCOUNTER — Other Ambulatory Visit: Payer: Self-pay

## 2018-08-27 VITALS — Temp 97.2°F

## 2018-08-27 DIAGNOSIS — Q828 Other specified congenital malformations of skin: Secondary | ICD-10-CM | POA: Diagnosis not present

## 2018-08-27 DIAGNOSIS — M79676 Pain in unspecified toe(s): Secondary | ICD-10-CM

## 2018-08-27 DIAGNOSIS — B351 Tinea unguium: Secondary | ICD-10-CM

## 2018-08-27 DIAGNOSIS — M722 Plantar fascial fibromatosis: Secondary | ICD-10-CM | POA: Diagnosis not present

## 2018-08-27 NOTE — Progress Notes (Signed)
She presents today chief complaint of painfully elongated toenails and painful plantar fasciitis.  She also has painful calluses.  Objective: Vital signs are stable alert and oriented x3.  Toenails are long thick yellow dystrophic-like mycotic pain on palpation medial [place] tubercles bilateral reactive hyper keratomas plantar aspect of the bilateral foot without open lesions or wounds.  Assessment: Plantar fasciitis chronic in nature bilateral.  Hammertoe deformities resulting in reactive hyper keratomas and thick mycotic nails.  Plan: Debridement of toenails 1 through 5 bilateral debridement of all reactive hyperkeratotic tissue I also injected bilateral heels today with 20 mg Kenalog 5 mg Marcaine point maximal tenderness bilateral.  Should she have questions or concerns she will notify us immediately otherwise a follow-up with her in about 3 months for routine nail debridement.

## 2018-09-13 DIAGNOSIS — N3942 Incontinence without sensory awareness: Secondary | ICD-10-CM | POA: Diagnosis not present

## 2018-09-13 DIAGNOSIS — M6281 Muscle weakness (generalized): Secondary | ICD-10-CM | POA: Diagnosis not present

## 2018-09-13 DIAGNOSIS — M62838 Other muscle spasm: Secondary | ICD-10-CM | POA: Diagnosis not present

## 2018-09-13 DIAGNOSIS — R351 Nocturia: Secondary | ICD-10-CM | POA: Diagnosis not present

## 2018-09-19 ENCOUNTER — Other Ambulatory Visit: Payer: Self-pay | Admitting: Family Medicine

## 2018-10-01 DIAGNOSIS — N3942 Incontinence without sensory awareness: Secondary | ICD-10-CM | POA: Diagnosis not present

## 2018-10-01 DIAGNOSIS — M6281 Muscle weakness (generalized): Secondary | ICD-10-CM | POA: Diagnosis not present

## 2018-10-01 DIAGNOSIS — M62838 Other muscle spasm: Secondary | ICD-10-CM | POA: Diagnosis not present

## 2018-10-01 DIAGNOSIS — R351 Nocturia: Secondary | ICD-10-CM | POA: Diagnosis not present

## 2018-10-21 ENCOUNTER — Other Ambulatory Visit: Payer: Self-pay | Admitting: Family Medicine

## 2018-10-22 DIAGNOSIS — R351 Nocturia: Secondary | ICD-10-CM | POA: Diagnosis not present

## 2018-10-22 DIAGNOSIS — M62838 Other muscle spasm: Secondary | ICD-10-CM | POA: Diagnosis not present

## 2018-10-22 DIAGNOSIS — M6281 Muscle weakness (generalized): Secondary | ICD-10-CM | POA: Diagnosis not present

## 2018-10-22 DIAGNOSIS — N3942 Incontinence without sensory awareness: Secondary | ICD-10-CM | POA: Diagnosis not present

## 2018-11-07 DIAGNOSIS — Z1231 Encounter for screening mammogram for malignant neoplasm of breast: Secondary | ICD-10-CM | POA: Diagnosis not present

## 2018-11-24 ENCOUNTER — Other Ambulatory Visit: Payer: Self-pay | Admitting: Family Medicine

## 2018-11-26 ENCOUNTER — Encounter: Payer: Self-pay | Admitting: Podiatry

## 2018-11-26 ENCOUNTER — Ambulatory Visit: Payer: Medicare HMO | Admitting: Podiatry

## 2018-11-26 ENCOUNTER — Other Ambulatory Visit: Payer: Self-pay

## 2018-11-26 DIAGNOSIS — M79676 Pain in unspecified toe(s): Secondary | ICD-10-CM | POA: Diagnosis not present

## 2018-11-26 DIAGNOSIS — B351 Tinea unguium: Secondary | ICD-10-CM | POA: Diagnosis not present

## 2018-11-26 DIAGNOSIS — M722 Plantar fascial fibromatosis: Secondary | ICD-10-CM | POA: Diagnosis not present

## 2018-11-26 DIAGNOSIS — Q828 Other specified congenital malformations of skin: Secondary | ICD-10-CM

## 2018-11-27 NOTE — Progress Notes (Signed)
She presents today for painful toenails and calluses.  She is also complaining of some mild plantar fasciitis.  Objective: Vital signs are stable she is alert and oriented x3 there is no erythema edema cellulitis drainage odor mild tenderness on palpation medial calcaneal tubercles bilateral.  Toenails are long thick yellow dystrophic onychomycotic multiple porokeratotic lesions plantar aspect of the bilateral foot no open lesions or wounds.  Assessment: Pain in limb secondary onychomycosis and calluses.  Pain limb secondary plan fasciitis chronic in nature.  Plan: I injected 10 mg of Kenalog 5 mg Marcaine point maximal tenderness bilateral heels.  Tolerated procedure well without complications.  I also debrided toenails 1 through 5 bilateral and rated all reactive hyperkeratoses.

## 2018-12-02 ENCOUNTER — Ambulatory Visit (INDEPENDENT_AMBULATORY_CARE_PROVIDER_SITE_OTHER): Payer: Medicare HMO | Admitting: *Deleted

## 2018-12-02 ENCOUNTER — Other Ambulatory Visit: Payer: Self-pay

## 2018-12-02 DIAGNOSIS — Z23 Encounter for immunization: Secondary | ICD-10-CM

## 2018-12-25 ENCOUNTER — Encounter: Payer: Self-pay | Admitting: Family Medicine

## 2018-12-27 ENCOUNTER — Other Ambulatory Visit: Payer: Self-pay | Admitting: Family Medicine

## 2018-12-27 NOTE — Telephone Encounter (Signed)
Rx sent to [facility]. Patient to follow up in 1 month.   Dorris Singh, MD  Family Medicine Teaching Service

## 2018-12-31 NOTE — Telephone Encounter (Signed)
Error

## 2019-01-14 DIAGNOSIS — H5203 Hypermetropia, bilateral: Secondary | ICD-10-CM | POA: Diagnosis not present

## 2019-01-28 ENCOUNTER — Ambulatory Visit: Payer: Medicare HMO | Admitting: Family Medicine

## 2019-01-29 ENCOUNTER — Ambulatory Visit (INDEPENDENT_AMBULATORY_CARE_PROVIDER_SITE_OTHER): Payer: Medicare HMO | Admitting: Nurse Practitioner

## 2019-01-29 ENCOUNTER — Other Ambulatory Visit: Payer: Self-pay

## 2019-01-29 ENCOUNTER — Encounter: Payer: Self-pay | Admitting: Nurse Practitioner

## 2019-01-29 VITALS — BP 122/80 | HR 56 | Temp 98.8°F | Ht 64.6 in | Wt 132.2 lb

## 2019-01-29 DIAGNOSIS — M722 Plantar fascial fibromatosis: Secondary | ICD-10-CM | POA: Diagnosis not present

## 2019-01-29 DIAGNOSIS — B0229 Other postherpetic nervous system involvement: Secondary | ICD-10-CM

## 2019-01-29 DIAGNOSIS — N3944 Nocturnal enuresis: Secondary | ICD-10-CM | POA: Diagnosis not present

## 2019-01-29 NOTE — Progress Notes (Addendum)
This visit occurred during the SARS-CoV-2 public health emergency.  Safety protocols were in place, including screening questions prior to the visit, additional usage of staff PPE, and extensive cleaning of exam room while observing appropriate contact time as indicated for disinfecting solutions.  Subjective:     Patient ID: Amy Whitehead , female    DOB: 08-04-1933 , 85 y.o.   MRN: [id number]   Chief Complaint  Patient presents with  . Establish Care    HPI  Here to establish care, she had been seen by Dr. Lezlie Octave.  She used to go to Dr. Caroleen Hamman when she relocated she started seeing Dr. Frances Furbish.  She is originally from [place], she worked for [employer] for 42 years.  Widowed. She has 3 children.   She lives alone.   PMH - shingles on left side (gabapentin)  FMH - sister - deceased.  Mother - deceased from natural causes.  Father - brain tumor - deceased age 76.    She reports she has suffered from shingles pain, she will have intermittent sharp pain. Occurs to the left side lateral back area.  No current rash present.  She will take gabapentin for relief. Tolerates well.    Past Medical History:  Diagnosis Date  . Arthritis   . Cataract   . Post herpetic neuralgia 04/17/2011     Family History  Problem Relation Age of Onset  . Cancer Father        brain tumor  . Cancer Sister        lung  . Diabetes Sister      Current Outpatient Medications:  .  aspirin EC 81 MG tablet, Take 81 mg by mouth daily., Disp: , Rfl:  .  Calcium-Vitamin D (CALTRATE 600 PLUS-VIT D PO), Take 2 tablets by mouth 2 (two) times daily., Disp: , Rfl:  .  gabapentin (NEURONTIN) 300 MG capsule, TAKE 3 CAPSULES BY MOUTH THREE TIMES DAILY, Disp: 270 capsule, Rfl: 1 .  Multiple Vitamins-Minerals (MULTIVITAMIN WITH MINERALS) tablet, Take 1 tablet by mouth daily., Disp: , Rfl:  .  Multiple Vitamins-Minerals (VISION-VITE PRESERVE PO), Take 2 tablets by mouth daily., Disp: , Rfl:  .  MYRBETRIQ 25 MG TB24  tablet, Take 1 tablet (25 mg total) by mouth daily. (Patient not taking: Reported on 01/29/2019), Disp: 30 tablet, Rfl: 11 .  Naproxen Sodium (ALEVE PO), Take by mouth., Disp: , Rfl:    No Known Allergies   Review of Systems  Constitutional: Negative.   Respiratory: Negative.   Cardiovascular: Negative.  Negative for chest pain, palpitations and leg swelling.  Musculoskeletal: Negative.   Neurological: Negative for dizziness and headaches.  Psychiatric/Behavioral: Negative.      Today's Vitals   01/29/19 1143  BP: 122/80  Pulse: (!) 56  Temp: 98.8 F (37.1 C)  TempSrc: Oral  SpO2: 90%  Weight: 132 lb 3.2 oz (60 kg)  Height: 5' 4.6" (1.641 m)  PainSc: 0-No pain   Body mass index is 22.27 kg/m.   Objective:  Physical Exam Vitals signs reviewed.  Constitutional:      Appearance: Normal appearance.  Cardiovascular:     Rate and Rhythm: Normal rate and regular rhythm.     Pulses: Normal pulses.     Heart sounds: Normal heart sounds. No murmur.  Pulmonary:     Effort: Pulmonary effort is normal. No respiratory distress.     Breath sounds: Normal breath sounds.  Skin:    Capillary Refill: Capillary refill takes less  than 2 seconds.  Neurological:     General: No focal deficit present.     Mental Status: She is alert and oriented to person, place, and time.         Assessment And Plan:     1. Post herpetic neuralgia  Had shingles on left lateral thoracic area  She is taking gabapentin three times a day  2. Plantar fasciitis of left foot  Chronic, being followed by podiatry and receiving steroid injections  3. Nocturnal enuresis  Chronic, she feels this has improved and does not take the mybretric any longer.   She also reports she had pelvic training   Minette Brine, FNP    THE PATIENT IS ENCOURAGED TO PRACTICE SOCIAL DISTANCING DUE TO THE COVID-19 PANDEMIC.

## 2019-01-30 LAB — BMP8+EGFR
BUN/Creatinine Ratio: 14 (ref 12–28)
BUN: 12 mg/dL (ref 8–27)
CO2: 28 mmol/L (ref 20–29)
Calcium: 9.7 mg/dL (ref 8.7–10.3)
Chloride: 101 mmol/L (ref 96–106)
Creatinine, Ser: 0.88 mg/dL (ref 0.57–1.00)
GFR calc Af Amer: 70 mL/min/{1.73_m2} (ref >59)
GFR calc non Af Amer: 61 mL/min/{1.73_m2} (ref >59)
Glucose: 76 mg/dL (ref 65–99)
Potassium: 4.5 mmol/L (ref 3.5–5.2)
Sodium: 144 mmol/L (ref 134–144)

## 2019-02-24 ENCOUNTER — Other Ambulatory Visit: Payer: Self-pay

## 2019-02-24 MED ORDER — GABAPENTIN 300 MG PO CAPS: 900.0000 mg | ORAL_CAPSULE | Freq: Three times a day (TID) | ORAL | 1 refills | Status: DC

## 2019-02-25 ENCOUNTER — Ambulatory Visit (INDEPENDENT_AMBULATORY_CARE_PROVIDER_SITE_OTHER): Payer: Medicare HMO | Admitting: Podiatry

## 2019-02-25 ENCOUNTER — Encounter: Payer: Self-pay | Admitting: Podiatry

## 2019-02-25 ENCOUNTER — Other Ambulatory Visit: Payer: Self-pay

## 2019-02-25 DIAGNOSIS — M722 Plantar fascial fibromatosis: Secondary | ICD-10-CM | POA: Diagnosis not present

## 2019-02-25 DIAGNOSIS — M79676 Pain in unspecified toe(s): Secondary | ICD-10-CM

## 2019-02-25 DIAGNOSIS — B351 Tinea unguium: Secondary | ICD-10-CM | POA: Diagnosis not present

## 2019-02-25 DIAGNOSIS — Q828 Other specified congenital malformations of skin: Secondary | ICD-10-CM

## 2019-02-25 NOTE — Progress Notes (Signed)
She presents today chief complaint of painfully elongated toenails bilaterally she is also complaining of painful calluses bilaterally.  She also complaining of painful heels bilaterally.  Objective: Vital signs are stable she is alert and oriented x3.  Pulses remain palpable severe fat pad atrophy plantarly the heel forefoot arch.  She has reactive hyper keratomas subfirst and subfifth bilaterally.  Pain on palpation medial calcaneal tubercles of plantar fascial K insertion sites bilateral.  Assessment: Plantar fasciitis bilateral.  Pain limb secondary to onychomycosis and porokeratosis.  Plan: Debridement of all reactive hyperkeratotic tissue.  Debridement of toenails 1 through 5 bilaterally.  Injected 10 mg medial longitudinal arch the plantar fascia bilaterally.  She tolerated seizure well without complications.  Follow-up with her in about 3 months.

## 2019-03-11 ENCOUNTER — Telehealth: Payer: Self-pay

## 2019-03-11 NOTE — Telephone Encounter (Signed)
Patient called wanting a covid vaccine I advised her we dont carry them in our office I offered her the number to the health department so she can schedule an appt and she declined stating she doesn't want to go to the location and she will just wait until they get to her. YRL,RMA

## 2019-03-12 ENCOUNTER — Telehealth: Payer: Self-pay | Admitting: Nurse Practitioner

## 2019-03-12 NOTE — Telephone Encounter (Signed)
I left a message asking the pt to call and schedule AWV with Nickeah. VDM (DD) °

## 2019-04-28 ENCOUNTER — Other Ambulatory Visit: Payer: Self-pay | Admitting: Nurse Practitioner

## 2019-04-29 ENCOUNTER — Ambulatory Visit: Payer: Medicare HMO | Admitting: Nurse Practitioner

## 2019-04-30 ENCOUNTER — Ambulatory Visit (INDEPENDENT_AMBULATORY_CARE_PROVIDER_SITE_OTHER): Payer: Medicare (Managed Care) | Admitting: Nurse Practitioner

## 2019-04-30 ENCOUNTER — Ambulatory Visit (INDEPENDENT_AMBULATORY_CARE_PROVIDER_SITE_OTHER): Payer: Medicare (Managed Care)

## 2019-04-30 ENCOUNTER — Encounter: Payer: Self-pay | Admitting: Nurse Practitioner

## 2019-04-30 ENCOUNTER — Other Ambulatory Visit: Payer: Self-pay

## 2019-04-30 VITALS — BP 108/64 | HR 64 | Temp 98.2°F | Ht 64.4 in | Wt 131.6 lb

## 2019-04-30 DIAGNOSIS — Z0000 Encounter for general adult medical examination without abnormal findings: Secondary | ICD-10-CM | POA: Diagnosis not present

## 2019-04-30 DIAGNOSIS — B0229 Other postherpetic nervous system involvement: Secondary | ICD-10-CM | POA: Diagnosis not present

## 2019-04-30 NOTE — Patient Instructions (Signed)
[name] , Thank you for taking time to come for your Medicare Wellness Visit. I appreciate your ongoing commitment to your health goals. Please review the following plan we discussed and let me know if I can assist you in the future.   Screening recommendations/referrals: Colonoscopy: not required Mammogram: 10/2018 Bone Density: 08/2015 Recommended yearly ophthalmology/optometry visit for glaucoma screening and checkup Recommended yearly dental visit for hygiene and checkup  Vaccinations: Influenza vaccine: 11/2018 Pneumococcal vaccine: 04/2017 Tdap vaccine: 09/2015 Shingles vaccine: discussed    Advanced directives: Please bring a copy of your POA (Power of [place]) and/or Living Will to your next appointment.    Conditions/risks identified: none  Next appointment:    Preventive Care 6 Years and Older, Female Preventive care refers to lifestyle choices and visits with your health care provider that can promote health and wellness. What does preventive care include?  A yearly physical exam. This is also called an annual well check.  Dental exams once or twice a year.  Routine eye exams. Ask your health care provider how often you should have your eyes checked.  Personal lifestyle choices, including:  Daily care of your teeth and gums.  Regular physical activity.  Eating a healthy diet.  Avoiding tobacco and drug use.  Limiting alcohol use.  Practicing safe sex.  Taking low-dose aspirin every day.  Taking vitamin and mineral supplements as recommended by your health care provider. What happens during an annual well check? The services and screenings done by your health care provider during your annual well check will depend on your age, overall health, lifestyle risk factors, and family history of disease. Counseling  Your health care provider may ask you questions about your:  Alcohol use.  Tobacco use.  Drug use.  Emotional well-being.  Home and  relationship well-being.  Sexual activity.  Eating habits.  History of falls.  Memory and ability to understand (cognition).  Work and work Astronomer.  Reproductive health. Screening  You may have the following tests or measurements:  Height, weight, and BMI.  Blood pressure.  Lipid and cholesterol levels. These may be checked every 5 years, or more frequently if you are over 71 years old.  Skin check.  Lung cancer screening. You may have this screening every year starting at age 16 if you have a 30-pack-year history of smoking and currently smoke or have quit within the past 15 years.  Fecal occult blood test (FOBT) of the stool. You may have this test every year starting at age 94.  Flexible sigmoidoscopy or colonoscopy. You may have a sigmoidoscopy every 5 years or a colonoscopy every 10 years starting at age 70.  Hepatitis C blood test.  Hepatitis B blood test.  Sexually transmitted disease (STD) testing.  Diabetes screening. This is done by checking your blood sugar (glucose) after you have not eaten for a while (fasting). You may have this done every 1-3 years.  Bone density scan. This is done to screen for osteoporosis. You may have this done starting at age 40.  Mammogram. This may be done every 1-2 years. Talk to your health care provider about how often you should have regular mammograms. Talk with your health care provider about your test results, treatment options, and if necessary, the need for more tests. Vaccines  Your health care provider may recommend certain vaccines, such as:  Influenza vaccine. This is recommended every year.  Tetanus, diphtheria, and acellular pertussis (Tdap, Td) vaccine. You may need a Td booster every  10 years.  Zoster vaccine. You may need this after age 5.  Pneumococcal 13-valent conjugate (PCV13) vaccine. One dose is recommended after age 12.  Pneumococcal polysaccharide (PPSV23) vaccine. One dose is recommended after  age 31. Talk to your health care provider about which screenings and vaccines you need and how often you need them. This information is not intended to replace advice given to you by your health care provider. Make sure you discuss any questions you have with your health care provider. Document Released: 03/12/2015 Document Revised: 11/03/2015 Document Reviewed: 12/15/2014 Elsevier Interactive Patient Education  2017 [employer] Prevention in the Home Falls can cause injuries. They can happen to people of all ages. There are many things you can do to make your home safe and to help prevent falls. What can I do on the outside of my home?  Regularly fix the edges of walkways and driveways and fix any cracks.  Remove anything that might make you trip as you walk through a door, such as a raised step or threshold.  Trim any bushes or trees on the path to your home.  Use bright outdoor lighting.  Clear any walking paths of anything that might make someone trip, such as rocks or tools.  Regularly check to see if handrails are loose or broken. Make sure that both sides of any steps have handrails.  Any raised decks and porches should have guardrails on the edges.  Have any leaves, snow, or ice cleared regularly.  Use sand or salt on walking paths during winter.  Clean up any spills in your garage right away. This includes oil or grease spills. What can I do in the bathroom?  Use night lights.  Install grab bars by the toilet and in the tub and shower. Do not use towel bars as grab bars.  Use non-skid mats or decals in the tub or shower.  If you need to sit down in the shower, use a plastic, non-slip stool.  Keep the floor dry. Clean up any water that spills on the floor as soon as it happens.  Remove soap buildup in the tub or shower regularly.  Attach bath mats securely with double-sided non-slip rug tape.  Do not have throw rugs and other things on the floor that can  make you trip. What can I do in the bedroom?  Use night lights.  Make sure that you have a light by your bed that is easy to reach.  Do not use any sheets or blankets that are too big for your bed. They should not hang down onto the floor.  Have a firm chair that has side arms. You can use this for support while you get dressed.  Do not have throw rugs and other things on the floor that can make you trip. What can I do in the kitchen?  Clean up any spills right away.  Avoid walking on wet floors.  Keep items that you use a lot in easy-to-reach places.  If you need to reach something above you, use a strong step stool that has a grab bar.  Keep electrical cords out of the way.  Do not use floor polish or wax that makes floors slippery. If you must use wax, use non-skid floor wax.  Do not have throw rugs and other things on the floor that can make you trip. What can I do with my stairs?  Do not leave any items on the stairs.  Make sure  that there are handrails on both sides of the stairs and use them. Fix handrails that are broken or loose. Make sure that handrails are as long as the stairways.  Check any carpeting to make sure that it is firmly attached to the stairs. Fix any carpet that is loose or worn.  Avoid having throw rugs at the top or bottom of the stairs. If you do have throw rugs, attach them to the floor with carpet tape.  Make sure that you have a light switch at the top of the stairs and the bottom of the stairs. If you do not have them, ask someone to add them for you. What else can I do to help prevent falls?  Wear shoes that:  Do not have high heels.  Have rubber bottoms.  Are comfortable and fit you well.  Are closed at the toe. Do not wear sandals.  If you use a stepladder:  Make sure that it is fully opened. Do not climb a closed stepladder.  Make sure that both sides of the stepladder are locked into place.  Ask someone to hold it for you,  if possible.  Clearly mark and make sure that you can see:  Any grab bars or handrails.  First and last steps.  Where the edge of each step is.  Use tools that help you move around (mobility aids) if they are needed. These include:  Canes.  Walkers.  Scooters.  Crutches.  Turn on the lights when you go into a dark area. Replace any light bulbs as soon as they burn out.  Set up your furniture so you have a clear path. Avoid moving your furniture around.  If any of your floors are uneven, fix them.  If there are any pets around you, be aware of where they are.  Review your medicines with your doctor. Some medicines can make you feel dizzy. This can increase your chance of falling. Ask your doctor what other things that you can do to help prevent falls. This information is not intended to replace advice given to you by your health care provider. Make sure you discuss any questions you have with your health care provider. Document Released: 12/10/2008 Document Revised: 07/22/2015 Document Reviewed: 03/20/2014 Elsevier Interactive Patient Education  2017 [employer].

## 2019-04-30 NOTE — Progress Notes (Signed)
  This visit occurred during the SARS-CoV-2 public health emergency.  Safety protocols were in place, including screening questions prior to the visit, additional usage of staff PPE, and extensive cleaning of exam room while observing appropriate contact time as indicated for disinfecting solutions.  Subjective:     Patient ID: Amy Whitehead , female    DOB: 02-08-1934 , 86 y.o.   MRN: [id number]   No chief complaint on file.   HPI  [facility] - she did well with the 1st vaccine    Past Medical History:  Diagnosis Date  . Arthritis   . Cataract   . Post herpetic neuralgia 04/17/2011     Family History  Problem Relation Age of Onset  . Cancer Father        brain tumor  . Cancer Sister        lung  . Diabetes Sister      Current Outpatient Medications:  .  aspirin EC 81 MG tablet, Take 81 mg by mouth daily., Disp: , Rfl:  .  Calcium-Vitamin D (CALTRATE 600 PLUS-VIT D PO), Take 2 tablets by mouth 2 (two) times daily., Disp: , Rfl:  .  gabapentin (NEURONTIN) 300 MG capsule, TAKE 3 CAPSULES BY MOUTH THREE TIMES DAILY, Disp: 270 capsule, Rfl: 0 .  Multiple Vitamins-Minerals (MULTIVITAMIN WITH MINERALS) tablet, Take 1 tablet by mouth daily., Disp: , Rfl:  .  Multiple Vitamins-Minerals (VISION-VITE PRESERVE PO), Take 2 tablets by mouth daily., Disp: , Rfl:  .  Naproxen Sodium (ALEVE PO), Take by mouth., Disp: , Rfl:    No Known Allergies   Review of Systems  Constitutional: Negative.   Respiratory: Negative.   Cardiovascular: Negative.   Neurological: Negative for dizziness and headaches.  Psychiatric/Behavioral: Negative.      Today's Vitals   04/30/19 1059  BP: 108/64  Pulse: 64  Temp: 98.2 F (36.8 C)  TempSrc: Oral  Weight: 131 lb 9.8 oz (59.7 kg)  Height: 5' 4.4" (1.636 m)  PainSc: 0-No pain   Body mass index is 22.31 kg/m.   Objective:  Physical Exam Constitutional:      Appearance: Normal appearance.  Cardiovascular:    Rate and Rhythm: Normal rate and regular rhythm.     Pulses: Normal pulses.     Heart sounds: Normal heart sounds. No murmur.  Pulmonary:     Effort: Pulmonary effort is normal.     Breath sounds: Normal breath sounds.  Skin:    Capillary Refill: Capillary refill takes less than 2 seconds.  Neurological:     General: No focal deficit present.     Mental Status: She is alert and oriented to person, place, and time.  Psychiatric:        Mood and Affect: Mood normal.        Behavior: Behavior normal.        Thought Content: Thought content normal.        Judgment: Judgment normal.         Assessment And Plan:     1. Post herpetic neuralgia  No current issues she is doing well with the gabpentin  Arnette Felts, FNP    THE PATIENT IS ENCOURAGED TO PRACTICE SOCIAL DISTANCING DUE TO THE COVID-19 PANDEMIC.

## 2019-04-30 NOTE — Progress Notes (Signed)
This visit occurred during the SARS-CoV-2 public health emergency.  Safety protocols were in place, including screening questions prior to the visit, additional usage of staff PPE, and extensive cleaning of exam room while observing appropriate contact time as indicated for disinfecting solutions.  Subjective:   Amy Whitehead is a 86 y.o. female who presents for Medicare Annual (Subsequent) preventive examination.  Review of Systems:  n/a Cardiac Risk Factors include: advanced age (>73men, >41 women);sedentary lifestyle     Objective:     Vitals: BP 108/64 (BP Location: Left Arm, Patient Position: Sitting, Cuff Size: Normal)   Pulse 64   Temp 98.2 F (36.8 C) (Oral)   Ht 5' 4.4" (1.636 m)   Wt 131 lb 9.6 oz (59.7 kg)   BMI 22.31 kg/m   Body mass index is 22.31 kg/m.  Advanced Directives 04/30/2019 12/20/2017 05/14/2017 10/14/2015 03/11/2015 10/12/2014 07/15/2014  Does Patient Have a Medical Advance Directive? Yes Yes No No No No No  Type of Estate agent of [place];Living will Healthcare Power of [place];Living will - - - - -  Copy of Healthcare Power of Attorney in Chart? No - copy requested No - copy requested - - - - -  Would patient like information on creating a medical advance directive? - - No - Patient declined Yes - Educational materials given No - patient declined information No - patient declined information No - patient declined information    Tobacco Social History   Tobacco Use  Smoking Status Former Smoker  . Types: Cigarettes  . Quit date: 04/16/1962  . Years since quitting: 57.0  Smokeless Tobacco Never Used  Tobacco Comment   No plans to start again     Counseling given: Not Answered Comment: No plans to start again   Clinical Intake:  Pre-visit preparation completed: Yes  Pain : No/denies pain     Nutritional Status: BMI of 19-24  Normal Nutritional Risks: None Diabetes: No  How often do you need to have someone help you  when you read instructions, pamphlets, or other written materials from your doctor or pharmacy?: 1 - Never What is the last grade level you completed in school?: 12th grade  Interpreter Needed?: No  Information entered by :: NAllen LPN  Past Medical History:  Diagnosis Date  . Arthritis   . Cataract   . Post herpetic neuralgia 04/17/2011   Past Surgical History:  Procedure Laterality Date  . 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  Unsure if uterus was removed, but menses stopped after surgery   Family History  Problem Relation Age of Onset  . Cancer Father        brain tumor  . Cancer Sister        lung  . Diabetes Sister    Social History   Socioeconomic History  . Marital status: Widowed    Spouse name: Not on file  . Number of children: 3  . Years of education: 43  . Highest education level: 12th grade  Occupational History  . Occupation: order entry/ key punch  . Occupation: retired  Tobacco Use  . Smoking status: Former Smoker    Types: Cigarettes    Quit date: 04/16/1962    Years since quitting: 57.0  . Smokeless tobacco: Never Used  . Tobacco comment: No plans to start again  Substance and Sexual Activity  . Alcohol use: No  . Drug use: No  .  Sexual activity: Never  Other Topics Concern  . Not on file  Social History Narrative   Retired from [employer] (Worked there for 42 years). High school graduate.  Lives alone. Has one dog, a Boston terrier named Gamerco. Walks daily for about an hour. Has 2 sons (one in [place], one in [place]), strained relationship with them.  Has a daughter and 2 grandchildren, age 54 and 50, who live in [place] area.  Strained relationship with son-in-law, who smokes and drinks.  Single, but helps a friend who currently has dementia.  Lives alone.  Drives.  Exercises by walking, especially when weather is nice.      Lives in [place]. 2nd level. No problems with the  stairs. No throw rugs. Smoke alarms. Wears seat belts in the car.       Diet: eats fruit, vegetables, and some meat.   Social Determinants of Health   Financial Resource Strain: Low Risk   . Difficulty of Paying Living Expenses: Not hard at all  Food Insecurity: No Food Insecurity  . Worried About Charity fundraiser in the Last Year: Never true  . Ran Out of Food in the Last Year: Never true  Transportation Needs: No Transportation Needs  . Lack of Transportation (Medical): No  . Lack of Transportation (Non-Medical): No  Physical Activity: Inactive  . Days of Exercise per Week: 0 days  . Minutes of Exercise per Session: 0 min  Stress: No Stress Concern Present  . Feeling of Stress : Not at all  Social Connections:   . Frequency of Communication with Friends and Family: Not on file  . Frequency of Social Gatherings with Friends and Family: Not on file  . Attends Religious Services: Not on file  . Active Member of Clubs or Organizations: Not on file  . Attends Archivist Meetings: Not on file  . Marital Status: Not on file    Outpatient Encounter Medications as of 04/30/2019  Medication Sig  . aspirin EC 81 MG tablet Take 81 mg by mouth daily.  . Calcium-Vitamin D (CALTRATE 600 PLUS-VIT D PO) Take 2 tablets by mouth 2 (two) times daily.  Marland Kitchen gabapentin (NEURONTIN) 300 MG capsule TAKE 3 CAPSULES BY MOUTH THREE TIMES DAILY  . Multiple Vitamins-Minerals (MULTIVITAMIN WITH MINERALS) tablet Take 1 tablet by mouth daily.  . Multiple Vitamins-Minerals (VISION-VITE PRESERVE PO) Take 2 tablets by mouth daily.  . Naproxen Sodium (ALEVE PO) Take by mouth.   No facility-administered encounter medications on file as of 04/30/2019.    Activities of Daily Living In your present state of health, do you have any difficulty performing the following activities: 04/30/2019 01/29/2019  Hearing? N N  Vision? N N  Difficulty concentrating or making decisions? N N  Walking or climbing stairs? N  N  Dressing or bathing? N N  Doing errands, shopping? N N  Preparing Food and eating ? N -  Using the Toilet? N -  In the past six months, have you accidently leaked urine? Y -  Comment wears a pad -  Do you have problems with loss of bowel control? N -  Managing your Medications? N -  Managing your Finances? N -  Housekeeping or managing your Housekeeping? N -  Some recent data might be hidden    Patient Care Team: Minette Brine, FNP as PCP - General ([place]) Webb Laws, [place] as Referring Physician (Optometry) [place], Romilda Garret, DPM as Consulting Physician (Podiatry) Festus Aloe, MD  as Consulting Physician (Urology)    Assessment:   This is a routine wellness examination for [facility].  Exercise Activities and Dietary recommendations Current Exercise Habits: The patient does not participate in regular exercise at present  Goals    . LDL CALC < 100    . Patient Stated     04/30/2019, no goals       Fall Risk Fall Risk  04/30/2019 01/29/2019 12/20/2017 05/14/2017 10/14/2015  Falls in the past year? 0 0 No No Yes  Number falls in past yr: - - - - 1  Injury with Fall? - - - - No  Risk for fall due to : Impaired balance/gait - - - Other (Comment)  Risk for fall due to: Comment - - - - tripped over dog  Follow up Falls evaluation completed;Education provided;Falls prevention discussed - - - -   Is the patient's home free of loose throw rugs in walkways, pet beds, electrical cords, etc?   yes      Grab bars in the bathroom? no      Handrails on the stairs?   yes      Adequate lighting?   yes  Timed Get Up and Go performed: n/a  Depression Screen PHQ 2/9 Scores 04/30/2019 01/29/2019 12/20/2017 05/14/2017  PHQ - 2 Score 0 0 0 0  PHQ- 9 Score 0 - - -     Cognitive Function MMSE - Mini Mental State Exam 12/20/2017  Orientation to time 5  Orientation to Place 5  Registration 3  Attention/ Calculation 5  Recall 3  Language- name 2 objects 2  Language- repeat 1    Language- follow 3 step command 3  Language- read & follow direction 1  Write a sentence 1  Copy design 1  Total score 30     6CIT Screen 04/30/2019 12/20/2017  What Year? 0 points 0 points  What month? 0 points 0 points  What time? 0 points 0 points  Count back from 20 0 points 0 points  Months in reverse 4 points 0 points  Repeat phrase 0 points 0 points  Total Score 4 0    Immunization History  Administered Date(s) Administered  . Fluad Quad(high Dose 65+) 12/02/2018  . Influenza,inj,Quad PF,6+ Mos 05/15/2013, 11/28/2013, 03/12/2015, 01/03/2016, 12/20/2017  . Influenza-Unspecified 11/24/2016  . Pneumococcal Conjugate-13 03/12/2015  . Pneumococcal Polysaccharide-23 05/14/2017  . Tdap 10/14/2015    Qualifies for Shingles Vaccine? yes  Screening Tests Health Maintenance  Topic Date Due  . TETANUS/TDAP  10/13/2025  . INFLUENZA VACCINE  Completed  . DEXA SCAN  Completed  . PNA vac Low Risk Adult  Completed    Cancer Screenings: Lung: Low Dose CT Chest recommended if Age 70-80 years, 30 pack-year currently smoking OR have quit w/in 15years. Patient does not qualify. Breast:  Up to date on Mammogram? Yes   Up to date of Bone Density/Dexa? Yes Colorectal: not required  Additional Screenings: : Hepatitis C Screening: n/a     Plan:    patient has no goals set at this time.   I have personally reviewed and noted the following in the patient's chart:   . Medical and social history . Use of alcohol, tobacco or illicit drugs  . Current medications and supplements . Functional ability and status . Nutritional status . Physical activity . Advanced directives . List of other physicians . Hospitalizations, surgeries, and ER visits in previous 12 months . Vitals . Screenings to include cognitive, depression, and falls .  Referrals and appointments  In addition, I have reviewed and discussed with patient certain preventive protocols, quality metrics, and best practice  recommendations. A written personalized care plan for preventive services as well as general preventive health recommendations were provided to patient.     Barb Merino, LPN  04/30/1958

## 2019-05-27 ENCOUNTER — Ambulatory Visit: Payer: Medicare HMO | Admitting: Podiatry

## 2019-05-28 ENCOUNTER — Other Ambulatory Visit: Payer: Self-pay | Admitting: Nurse Practitioner

## 2019-05-28 NOTE — Telephone Encounter (Signed)
Gabapentin refill

## 2019-06-03 ENCOUNTER — Other Ambulatory Visit: Payer: Self-pay

## 2019-06-03 ENCOUNTER — Encounter: Payer: Self-pay | Admitting: Podiatry

## 2019-06-03 ENCOUNTER — Ambulatory Visit (INDEPENDENT_AMBULATORY_CARE_PROVIDER_SITE_OTHER): Payer: Medicare (Managed Care) | Admitting: Podiatry

## 2019-06-03 DIAGNOSIS — M722 Plantar fascial fibromatosis: Secondary | ICD-10-CM

## 2019-06-03 DIAGNOSIS — L97511 Non-pressure chronic ulcer of other part of right foot limited to breakdown of skin: Secondary | ICD-10-CM | POA: Diagnosis not present

## 2019-06-03 DIAGNOSIS — B351 Tinea unguium: Secondary | ICD-10-CM

## 2019-06-03 DIAGNOSIS — Q828 Other specified congenital malformations of skin: Secondary | ICD-10-CM

## 2019-06-03 DIAGNOSIS — M79676 Pain in unspecified toe(s): Secondary | ICD-10-CM | POA: Diagnosis not present

## 2019-06-03 NOTE — Progress Notes (Signed)
She presents today chief complaint of painfully elongated toenails painful calluses to the hallux right.  She also complaining of painful right heel.  Objective: Vital signs are stable alert and oriented x3.  Pulses are palpable.  She has a small blister to the hallux right.  States that she may have gotten this from her new shoes.  I debrided the blister today it appears to be good and clean at this point.  I see no signs of infection at this point.  Minimal erythema no cellulitis or odor associated with this.  Her toenails are long thick yellow dystrophic-like mycotic multiple areas of reactive hyperkeratosis to the plantar aspect of the forefoot and toes.  She also has plantar fasciitis of the right heel with pain on palpation medial care tubercle.  Assessment: Fasciitis right superficial ulceration or blister was debrided hallux right and painful elongated toenails with calluses.    Plan: Debrided the area today hallux right placed antibiotic ointment and padding.  Encouraged her to start Epson salts and warm water soaks and Neosporin daily.  Also debrided toenails 1 through 5 debrided all reactive hyperkeratosis.  Also injected the right heel with 10 mg of Kenalog 5 mg Marcaine.

## 2019-06-24 ENCOUNTER — Ambulatory Visit: Payer: Medicare (Managed Care) | Admitting: Podiatry

## 2019-06-24 ENCOUNTER — Other Ambulatory Visit: Payer: Self-pay

## 2019-06-24 DIAGNOSIS — L97511 Non-pressure chronic ulcer of other part of right foot limited to breakdown of skin: Secondary | ICD-10-CM | POA: Diagnosis not present

## 2019-06-24 NOTE — Progress Notes (Signed)
She presents today for follow-up of her ulcer hallux. She states that she has been soaking it but has not been dressing it or putting any ointment on it.  Objective: Vital signs are stable alert and oriented x3. Pulses are palpable. Still has severe fat pad atrophy and mallet toe deformity resulting in reactive hyperkeratosis was debrided demonstrates a 0.5 x 0.5 cm nonprobing ulcerative lesion with granulation tissue no purulence no malodor.  Assessment: Ulceration hallux right.  Plan: Debrided the area today redressed it and instructed her on how to do so follow-up with her in 2 weeks

## 2019-06-28 ENCOUNTER — Other Ambulatory Visit: Payer: Self-pay | Admitting: Nurse Practitioner

## 2019-06-30 ENCOUNTER — Other Ambulatory Visit: Payer: Self-pay

## 2019-06-30 MED ORDER — GABAPENTIN 300 MG PO CAPS: 900.0000 mg | ORAL_CAPSULE | Freq: Three times a day (TID) | ORAL | 1 refills | Status: DC

## 2019-06-30 NOTE — Telephone Encounter (Signed)
Pls refill

## 2019-06-30 NOTE — Telephone Encounter (Signed)
Gabapentin refill

## 2019-07-07 ENCOUNTER — Other Ambulatory Visit: Payer: Self-pay | Admitting: Nurse Practitioner

## 2019-07-07 MED ORDER — GABAPENTIN 300 MG PO CAPS: 900.0000 mg | ORAL_CAPSULE | Freq: Three times a day (TID) | ORAL | 1 refills | Status: DC

## 2019-07-15 ENCOUNTER — Other Ambulatory Visit: Payer: Self-pay

## 2019-07-15 ENCOUNTER — Ambulatory Visit: Payer: Medicare (Managed Care) | Admitting: Podiatry

## 2019-07-15 ENCOUNTER — Encounter: Payer: Self-pay | Admitting: Podiatry

## 2019-07-15 DIAGNOSIS — L97511 Non-pressure chronic ulcer of other part of right foot limited to breakdown of skin: Secondary | ICD-10-CM | POA: Diagnosis not present

## 2019-07-15 NOTE — Progress Notes (Signed)
She presents today for follow-up of ulceration hallux right.  Denies fever chills nausea vomit muscle aches and pains.  States that she has been soaking it every day and putting a little antibiotic ointment on it.  Objective: Vital signs are stable she is alert oriented x3 malleted hallux demonstrates a distal clavus that has ulcerated plantar hallux.  There is no erythema no purulence no malodor ulceration is clean dry debrided today measures about 5 mm in diameter there is some granular bleeding tissue present no signs of infection.  Assessment granular nonprobing wound distal aspect right hallux.  Plan: Debrided the area today she will continue to place padding daily and I placed her in a sulcus pad to help hold the tip of that toe up.  I will see her back in about 3 weeks.

## 2019-08-19 ENCOUNTER — Other Ambulatory Visit: Payer: Self-pay

## 2019-08-19 ENCOUNTER — Encounter: Payer: Self-pay | Admitting: Podiatry

## 2019-08-19 ENCOUNTER — Ambulatory Visit (INDEPENDENT_AMBULATORY_CARE_PROVIDER_SITE_OTHER): Payer: Medicare (Managed Care) | Admitting: Podiatry

## 2019-08-19 DIAGNOSIS — M722 Plantar fascial fibromatosis: Secondary | ICD-10-CM

## 2019-08-19 DIAGNOSIS — L97511 Non-pressure chronic ulcer of other part of right foot limited to breakdown of skin: Secondary | ICD-10-CM | POA: Diagnosis not present

## 2019-08-19 NOTE — Progress Notes (Signed)
She presents today for follow-up of her ulceration to the hallux right foot.  She is also complaining of painful right heel.  Objective: Vital signs are stable she is alert oriented x3 pulses are barely palpable right foot.  Hallux malleus is resulted in a superficial skin breakdown measuring approximately 4 mm in diameter predebridement does not probe deep to bone.  No purulence no malodor post debridement measures about 6 mm again minimal bleeding does not probe to bone.  She has pain on palpation medial calcaneal tubercle of the right heel to an area that feels like a plantar fibroma.  Assessment: Plantar fibroma plantar fasciitis.  Chronic hallux malleus with chronic ulceration distal aspect right foot peripheral vascular disease.  Plan: Discussed etiology pathology conservative surgical therapies at this point I debrided the wound today post debridement with a 15 blade measures 6 mm in diameter no purulence.  Also injected the heel today with 4 mg of dexamethasone to the nodule that is most painful.  She tolerated procedure well I will follow-up with her in about a month.  Placed padding and a buttress pad to help elevate the toe from rubbing.

## 2019-09-02 ENCOUNTER — Ambulatory Visit (INDEPENDENT_AMBULATORY_CARE_PROVIDER_SITE_OTHER): Payer: Medicare (Managed Care) | Admitting: Podiatry

## 2019-09-02 ENCOUNTER — Other Ambulatory Visit: Payer: Self-pay

## 2019-09-02 VITALS — Temp 96.3°F

## 2019-09-02 DIAGNOSIS — L97511 Non-pressure chronic ulcer of other part of right foot limited to breakdown of skin: Secondary | ICD-10-CM | POA: Diagnosis not present

## 2019-09-02 NOTE — Progress Notes (Signed)
She presents today for follow-up of her plantar fasciitis which she states is doing much better the ulceration hallux right.  She states that she has been soaking her feet and states that she thinks that she may have changed the dressing once or twice.  She denies fever chills nausea vomiting muscle aches pains states that she is unaware of any odor.  She states that that really does not hurt.  Objective: Vital signs are stable she is alert and oriented x3.  Pulses are palpable.  Ulceration distal aspect of the hallux right foot demonstrates ulcerative lesion measuring about 4 mm in diameter once debrided 7 mm in diameter.  Does not probe deep there is no malodor no purulence no malodor.  No granulation tissue.  Assessment: Ulcerative lesion noncomplicated right.  Plan: Debrided the ulcer today with sharp chisel blade.  Tolerated procedure well without complications redressed today dressed a compressive dressing encouraged her to soak daily and clean daily as well as dress daily I will follow-up with her in 1 month for all of her routine debridement needs.

## 2019-10-06 ENCOUNTER — Other Ambulatory Visit: Payer: Self-pay | Admitting: Nurse Practitioner

## 2019-10-06 ENCOUNTER — Other Ambulatory Visit: Payer: Self-pay

## 2019-10-06 MED ORDER — GABAPENTIN 300 MG PO CAPS: 900.0000 mg | ORAL_CAPSULE | Freq: Three times a day (TID) | ORAL | 0 refills | Status: DC

## 2019-10-07 ENCOUNTER — Ambulatory Visit (INDEPENDENT_AMBULATORY_CARE_PROVIDER_SITE_OTHER): Payer: Medicare (Managed Care) | Admitting: Podiatry

## 2019-10-07 ENCOUNTER — Other Ambulatory Visit: Payer: Self-pay

## 2019-10-07 ENCOUNTER — Ambulatory Visit: Payer: Medicare (Managed Care) | Admitting: Podiatry

## 2019-10-07 DIAGNOSIS — L97511 Non-pressure chronic ulcer of other part of right foot limited to breakdown of skin: Secondary | ICD-10-CM

## 2019-10-07 DIAGNOSIS — M79676 Pain in unspecified toe(s): Secondary | ICD-10-CM | POA: Diagnosis not present

## 2019-10-07 DIAGNOSIS — B351 Tinea unguium: Secondary | ICD-10-CM | POA: Diagnosis not present

## 2019-10-08 NOTE — Progress Notes (Signed)
She presents today for chief complaint of a painful ulceration hallux right and painful elongated toenails.  Objective: Vital signs are stable she is alert oriented x3 ulceration to the right hallux measures about 4 mm prior to debridement with a chisel blade and then about 6 mm afterwards.  It does not probe deep but does not probe to bone there is no purulence no malodor  Toenails are long thick yellow dystrophic clinically mycotic painful palpation.  Assessment: Chronic ulceration distal aspect hallux right pain in limb secondary to onychomycosis.  Plan: Once again demonstrated to her today how to dress the toe she states that she understands but obviously she has some inability to comprehend exactly what needs to be done.  I do think that she can continue to dress it to prevent this from getting infected and I went ahead and debrided her toenails

## 2019-11-04 ENCOUNTER — Other Ambulatory Visit: Payer: Self-pay | Admitting: Nurse Practitioner

## 2019-11-04 ENCOUNTER — Ambulatory Visit (INDEPENDENT_AMBULATORY_CARE_PROVIDER_SITE_OTHER): Payer: Medicare (Managed Care) | Admitting: Nurse Practitioner

## 2019-11-04 ENCOUNTER — Other Ambulatory Visit: Payer: Self-pay

## 2019-11-04 ENCOUNTER — Encounter: Payer: Self-pay | Admitting: Nurse Practitioner

## 2019-11-04 VITALS — BP 114/68 | HR 64 | Temp 97.4°F | Ht 64.4 in | Wt 117.0 lb

## 2019-11-04 DIAGNOSIS — Z23 Encounter for immunization: Secondary | ICD-10-CM | POA: Diagnosis not present

## 2019-11-04 DIAGNOSIS — Z0000 Encounter for general adult medical examination without abnormal findings: Secondary | ICD-10-CM

## 2019-11-04 DIAGNOSIS — Z1159 Encounter for screening for other viral diseases: Secondary | ICD-10-CM

## 2019-11-04 DIAGNOSIS — Z1231 Encounter for screening mammogram for malignant neoplasm of breast: Secondary | ICD-10-CM | POA: Diagnosis not present

## 2019-11-04 NOTE — Progress Notes (Signed)
Rutherford Nail as a scribe for Minette Brine, FNP.,have documented all relevant documentation on the behalf of Minette Brine, FNP,as directed by  Minette Brine, FNP while in the presence of Minette Brine, [place].  This visit occurred during the SARS-CoV-2 public health emergency.  Safety protocols were in place, including screening questions prior to the visit, additional usage of staff PPE, and extensive cleaning of exam room while observing appropriate contact time as indicated for disinfecting solutions.  Subjective:     Patient ID: Amy Whitehead , female    DOB: Jul 14, 1933 , 86 y.o.   MRN: [id number]   Chief Complaint  Patient presents with  . Annual Exam    HPI  Pt presents today for Health maintenance exam.  She has a daughter who lives in [place] area and 2 sons she is estranged from .     Past Medical History:  Diagnosis Date  . Arthritis   . Cataract   . Post herpetic neuralgia 04/17/2011     Family History  Problem Relation Age of Onset  . Cancer Father        brain tumor  . Cancer Sister        lung  . Diabetes Sister      Current Outpatient Medications:  .  aspirin EC 81 MG tablet, Take 81 mg by mouth daily., Disp: , Rfl:  .  Calcium-Vitamin D (CALTRATE 600 PLUS-VIT D PO), Take 2 tablets by mouth 2 (two) times daily., Disp: , Rfl:  .  gabapentin (NEURONTIN) 300 MG capsule, Take 3 capsules (900 mg total) by mouth 3 (three) times daily., Disp: 270 capsule, Rfl: 0 .  Multiple Vitamins-Minerals (MULTIVITAMIN WITH MINERALS) tablet, Take 1 tablet by mouth daily., Disp: , Rfl:  .  Multiple Vitamins-Minerals (VISION-VITE PRESERVE PO), Take 2 tablets by mouth daily., Disp: , Rfl:  .  Naproxen Sodium (ALEVE PO), Take by mouth., Disp: , Rfl:    No Known Allergies    The patient states she is status post hysterectomy. Negative for Dysmenorrhea and Negative for Menorrhagia. Negative for: breast discharge, breast lump(s), breast pain and breast self exam. Associated  symptoms include abnormal vaginal bleeding. Pertinent negatives include abnormal bleeding (hematology), anxiety, decreased libido, depression, difficulty falling sleep, dyspareunia, history of infertility, nocturia, sexual dysfunction, sleep disturbances, urinary incontinence, urinary urgency, vaginal discharge and vaginal itching. Diet regular.The patient states her exercise level is minimal exercise with walking her dog daily.   The patient's tobacco use is:  Social History   Tobacco Use  Smoking Status Former Smoker  . Types: Cigarettes  . Quit date: 04/16/1962  . Years since quitting: 57.6  Smokeless Tobacco Never Used  Tobacco Comment   No plans to start again   She has been exposed to passive smoke. The patient's alcohol use is:  Social History   Substance and Sexual Activity  Alcohol Use No   Additional information: Last pap - hysterectomy  Review of Systems  Constitutional: Negative.  Negative for fatigue.  HENT: Negative.   Eyes: Negative.   Respiratory: Negative.   Cardiovascular: Negative.   Gastrointestinal: Negative.   Endocrine: Negative for polydipsia, polyphagia and polyuria.  Musculoskeletal: Negative.   Skin: Negative.   Neurological: Negative for dizziness and headaches.  Hematological: Negative.   Psychiatric/Behavioral: Negative.      Today's Vitals   11/04/19 1008  BP: 114/68  Pulse: 64  Temp: (!) 97.4 F (36.3 C)  TempSrc: Oral  Weight: 117 lb (53.1 kg)  Height: 5'  4.4" (1.636 m)  PainSc: 0-No pain   Body mass index is 19.83 kg/m.   Objective:  Physical Exam Vitals reviewed.  Constitutional:      General: She is not in acute distress.    Appearance: Normal appearance. She is well-developed.  HENT:     Head: Normocephalic and atraumatic.     Right Ear: Hearing, tympanic membrane, ear canal and external ear normal. There is impacted cerumen (hard cerumen present to canal).     Left Ear: Hearing, tympanic membrane, ear canal and external  ear normal. There is no impacted cerumen.     Nose:     Comments: Deferred - masked    Mouth/Throat:     Comments: Deferred - masked Eyes:     General: Lids are normal.     Extraocular Movements: Extraocular movements intact.     Conjunctiva/sclera: Conjunctivae normal.     Pupils: Pupils are equal, round, and reactive to light.     Funduscopic exam:    Right eye: No papilledema.        Left eye: No papilledema.  Neck:     Thyroid: No thyroid mass.     Vascular: No carotid bruit.  Cardiovascular:     Rate and Rhythm: Normal rate and regular rhythm.     Pulses: Normal pulses.     Heart sounds: Normal heart sounds. No murmur heard.   Pulmonary:     Effort: Pulmonary effort is normal.     Breath sounds: Normal breath sounds.  Abdominal:     General: Abdomen is flat. Bowel sounds are normal. There is no distension.     Palpations: Abdomen is soft.     Tenderness: There is no abdominal tenderness.  Genitourinary:    Rectum: Guaiac result negative.  Musculoskeletal:        General: No swelling. Normal range of motion.     Cervical back: Full passive range of motion without pain, normal range of motion and neck supple.     Right lower leg: No edema.     Left lower leg: No edema.  Skin:    General: Skin is warm and dry.     Capillary Refill: Capillary refill takes less than 2 seconds.  Neurological:     General: No focal deficit present.     Mental Status: She is alert and oriented to person, place, and time.     Cranial Nerves: No cranial nerve deficit.     Sensory: No sensory deficit.  Psychiatric:        Mood and Affect: Mood normal.        Behavior: Behavior normal.        Thought Content: Thought content normal.        Judgment: Judgment normal.         Assessment And Plan:     1. Health care maintenance . Behavior modifications discussed and diet history reviewed.   . Pt will continue to exercise regularly and modify diet with low GI, plant based foods and  decrease intake of processed foods.  . Recommend intake of daily multivitamin, Vitamin D, and calcium.  . Recommend mammogram for preventive screenings, as well as recommend immunizations that include influenza, TDAP (up to date) - CMP14+EGFR  2. Encounter for hepatitis C screening test for low risk patient  Will check Hepatitis C screening due to recent recommendations to screen all adults 18 years and older  3. Encounter for screening mammogram for malignant neoplasm of breast  Pt instructed on Self Breast Exam.According to ACOG guidelines Women aged 58 and older are recommended to get an annual mammogram. Referral placed for  [facility] for appointment scheduling.   Pt encouraged to get annual mammogram - MM DIGITAL SCREENING BILATERAL; Future  4. Need for influenza vaccination  Influenza vaccine administered  Encouraged to take Tylenol as needed for fever or muscle aches. - Flu Vaccine QUAD High Dose(Fluad)     Patient was given opportunity to ask questions. Patient verbalized understanding of the plan and was able to repeat key elements of the plan. All questions were answered to their satisfaction.    Teola Bradley, FNP, have reviewed all documentation for this visit. The documentation on 11/09/19 for the exam, diagnosis, procedures, and orders are all accurate and complete.  THE PATIENT IS ENCOURAGED TO PRACTICE SOCIAL DISTANCING DUE TO THE COVID-19 PANDEMIC.

## 2019-11-04 NOTE — Patient Instructions (Signed)
Health Maintenance After Age 84 After age 84, you are at a higher risk for certain long-term diseases and infections as well as injuries from falls. Falls are a major cause of broken bones and head injuries in people who are older than age 84. Getting regular preventive care can help to keep you healthy and well. Preventive care includes getting regular testing and making lifestyle changes as recommended by your health care provider. Talk with your health care provider about:  Which screenings and tests you should have. A screening is a test that checks for a disease when you have no symptoms.  A diet and exercise plan that is right for you. What should I know about screenings and tests to prevent falls? Screening and testing are the best ways to find a health problem early. Early diagnosis and treatment give you the best chance of managing medical conditions that are common after age 84. Certain conditions and lifestyle choices may make you more likely to have a fall. Your health care provider may recommend:  Regular vision checks. Poor vision and conditions such as cataracts can make you more likely to have a fall. If you wear glasses, make sure to get your prescription updated if your vision changes.  Medicine review. Work with your health care provider to regularly review all of the medicines you are taking, including over-the-counter medicines. Ask your health care provider about any side effects that may make you more likely to have a fall. Tell your health care provider if any medicines that you take make you feel dizzy or sleepy.  Osteoporosis screening. Osteoporosis is a condition that causes the bones to get weaker. This can make the bones weak and cause them to break more easily.  Blood pressure screening. Blood pressure changes and medicines to control blood pressure can make you feel dizzy.  Strength and balance checks. Your health care provider may recommend certain tests to check your  strength and balance while standing, walking, or changing positions.  Foot health exam. Foot pain and numbness, as well as not wearing proper footwear, can make you more likely to have a fall.  Depression screening. You may be more likely to have a fall if you have a fear of falling, feel emotionally low, or feel unable to do activities that you used to do.  Alcohol use screening. Using too much alcohol can affect your balance and may make you more likely to have a fall. What actions can I take to lower my risk of falls? General instructions  Talk with your health care provider about your risks for falling. Tell your health care provider if: ? You fall. Be sure to tell your health care provider about all falls, even ones that seem minor. ? You feel dizzy, sleepy, or off-balance.  Take over-the-counter and prescription medicines only as told by your health care provider. These include any supplements.  Eat a healthy diet and maintain a healthy weight. A healthy diet includes low-fat dairy products, low-fat (lean) meats, and fiber from whole grains, beans, and lots of fruits and vegetables. Home safety  Remove any tripping hazards, such as rugs, cords, and clutter.  Install safety equipment such as grab bars in bathrooms and safety rails on stairs.  Keep rooms and walkways well-lit. Activity   Follow a regular exercise program to stay fit. This will help you maintain your balance. Ask your health care provider what types of exercise are appropriate for you.  If you need a cane or   walker, use it as recommended by your health care provider.  Wear supportive shoes that have nonskid soles. Lifestyle  Do not drink alcohol if your health care provider tells you not to drink.  If you drink alcohol, limit how much you have: ? 0-1 drink a day for women. ? 0-2 drinks a day for men.  Be aware of how much alcohol is in your drink. In the [place] one drink equals one typical bottle of beer (12  oz), one-half glass of wine (5 oz), or one shot of hard liquor (1 oz).  Do not use any products that contain nicotine or tobacco, such as cigarettes and e-cigarettes. If you need help quitting, ask your health care provider. Summary  Having a healthy lifestyle and getting preventive care can help to protect your health and wellness after age 84.  Screening and testing are the best way to find a health problem early and help you avoid having a fall. Early diagnosis and treatment give you the best chance for managing medical conditions that are more common for people who are older than age 84.  Falls are a major cause of broken bones and head injuries in people who are older than age 84. Take precautions to prevent a fall at home.  Work with your health care provider to learn what changes you can make to improve your health and wellness and to prevent falls. This information is not intended to replace advice given to you by your health care provider. Make sure you discuss any questions you have with your health care provider. Document Revised: 06/06/2018 Document Reviewed: 12/27/2016 Elsevier Patient Education  2020 [employer]vier Inc[employer]  

## 2019-11-05 LAB — CMP14+EGFR
ALT: 17 IU/L (ref 0–32)
AST: 30 IU/L (ref 0–40)
Albumin/Globulin Ratio: 2 (ref 1.2–2.2)
Albumin: 4.3 g/dL (ref 3.6–4.6)
Alkaline Phosphatase: 63 IU/L (ref 48–121)
BUN/Creatinine Ratio: 22 (ref 12–28)
BUN: 18 mg/dL (ref 8–27)
Bilirubin Total: 0.5 mg/dL (ref 0.0–1.2)
CO2: 28 mmol/L (ref 20–29)
Calcium: 9.5 mg/dL (ref 8.7–10.3)
Chloride: 104 mmol/L (ref 96–106)
Creatinine, Ser: 0.81 mg/dL (ref 0.57–1.00)
GFR calc Af Amer: 77 mL/min/{1.73_m2} (ref >59)
GFR calc non Af Amer: 67 mL/min/{1.73_m2} (ref >59)
Globulin, Total: 2.1 g/dL (ref 1.5–4.5)
Glucose: 73 mg/dL (ref 65–99)
Potassium: 4 mmol/L (ref 3.5–5.2)
Sodium: 145 mmol/L — ABNORMAL HIGH (ref 134–144)
Total Protein: 6.4 g/dL (ref 6.0–8.5)

## 2019-11-06 ENCOUNTER — Encounter: Payer: Self-pay | Admitting: Podiatry

## 2019-11-06 ENCOUNTER — Ambulatory Visit (INDEPENDENT_AMBULATORY_CARE_PROVIDER_SITE_OTHER): Payer: Medicare (Managed Care) | Admitting: Podiatry

## 2019-11-06 ENCOUNTER — Other Ambulatory Visit: Payer: Self-pay

## 2019-11-06 DIAGNOSIS — L97511 Non-pressure chronic ulcer of other part of right foot limited to breakdown of skin: Secondary | ICD-10-CM | POA: Diagnosis not present

## 2019-11-06 NOTE — Progress Notes (Signed)
She presents today for follow-up of her ulceration distal hallux right foot.  States that this is doing pretty well still and things bother me.  Objective: Vital signs are stable she is alert oriented x3 pulses are palpable.  Has a superficial ulceration distal aspect of the hallux right foot with a hallux malleus.  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  Noncomplicated.  Plan: Discussed etiology pathology and surgical therapies at this point in time went ahead and debrided all reactive hyperkeratotic tissue debrided all necrotic tissue.  Placed padding and dressing and requested that she do the same to continue the healing pattern.  Follow-up with her in about a month.

## 2019-11-14 LAB — HM MAMMOGRAPHY

## 2019-11-24 ENCOUNTER — Encounter: Payer: Self-pay | Admitting: Nurse Practitioner

## 2019-12-04 ENCOUNTER — Ambulatory Visit: Payer: Medicare (Managed Care) | Admitting: Podiatry

## 2019-12-11 ENCOUNTER — Other Ambulatory Visit: Payer: Self-pay | Admitting: Nurse Practitioner

## 2020-01-06 ENCOUNTER — Ambulatory Visit: Payer: Medicare (Managed Care) | Admitting: Podiatry

## 2020-01-06 ENCOUNTER — Encounter: Payer: Self-pay | Admitting: Podiatry

## 2020-01-06 ENCOUNTER — Other Ambulatory Visit: Payer: Self-pay

## 2020-01-06 DIAGNOSIS — B351 Tinea unguium: Secondary | ICD-10-CM | POA: Diagnosis not present

## 2020-01-06 DIAGNOSIS — M722 Plantar fascial fibromatosis: Secondary | ICD-10-CM | POA: Diagnosis not present

## 2020-01-06 DIAGNOSIS — L97511 Non-pressure chronic ulcer of other part of right foot limited to breakdown of skin: Secondary | ICD-10-CM

## 2020-01-06 DIAGNOSIS — M79676 Pain in unspecified toe(s): Secondary | ICD-10-CM | POA: Diagnosis not present

## 2020-01-06 NOTE — Progress Notes (Signed)
She presents today for follow-up of her chronic ulceration distal aspect of the hallux right she also has some plantar fasciitis type pain that is minimal.  She also would like for me to trim her toenails.  Objective: Vital signs are stable she is alert and oriented x3 pulses are palpable.  Distal hallux right does demonstrate a mallet toe deformity and skin breakdown.  This is no worse than it was before there is granulation tissue present does not probe to bone.  Reactive hyperkeratotic tissue was debrided with a 15 blade measures 5 mm in total diameter no purulence no malodor.  Toenails are long thick yellow dystrophic-like mycotic painful palpation bilaterally.  He also has pain on palpation medial calcaneal tubercles.  Assessment: Pain in limb secondary to onychomycosis.  Also secondary to mallet toe deformity and chronic ulceration.  Also secondary to chronic intractable plantar fasciitis bilateral.  Plan: Discussed etiology pathology conservative versus surgical therapies at this point I went ahead and injected the bilateral heels with dexamethasone and local anesthetic.  Debrided the wound today to the right great toe and debridement of toenails.  Dress the toe today we will follow-up with her in a few weeks encouraged her to continue dressing the toe regularly.  May need to consider a flexor hallucis longus transection.

## 2020-01-11 ENCOUNTER — Other Ambulatory Visit: Payer: Self-pay

## 2020-01-11 ENCOUNTER — Emergency Department (HOSPITAL_COMMUNITY): Payer: Medicare (Managed Care)

## 2020-01-11 ENCOUNTER — Encounter (HOSPITAL_COMMUNITY): Payer: Self-pay

## 2020-01-11 ENCOUNTER — Inpatient Hospital Stay (HOSPITAL_COMMUNITY)
Admission: EM | Admit: 2020-01-11 | Discharge: 2020-01-20 | DRG: 602 | Disposition: A | Discharge status: Skilled Nursing Facility | Payer: Medicare (Managed Care) | Attending: Student | Admitting: Student

## 2020-01-11 DIAGNOSIS — M7989 Other specified soft tissue disorders: Secondary | ICD-10-CM | POA: Diagnosis present

## 2020-01-11 DIAGNOSIS — Z781 Physical restraint status: Secondary | ICD-10-CM

## 2020-01-11 DIAGNOSIS — Z833 Family history of diabetes mellitus: Secondary | ICD-10-CM

## 2020-01-11 DIAGNOSIS — R339 Retention of urine, unspecified: Secondary | ICD-10-CM | POA: Diagnosis present

## 2020-01-11 DIAGNOSIS — M1990 Unspecified osteoarthritis, unspecified site: Secondary | ICD-10-CM | POA: Diagnosis present

## 2020-01-11 DIAGNOSIS — R030 Elevated blood-pressure reading, without diagnosis of hypertension: Secondary | ICD-10-CM | POA: Diagnosis present

## 2020-01-11 DIAGNOSIS — Z801 Family history of malignant neoplasm of trachea, bronchus and lung: Secondary | ICD-10-CM | POA: Diagnosis not present

## 2020-01-11 DIAGNOSIS — W548XXA Other contact with dog, initial encounter: Secondary | ICD-10-CM | POA: Diagnosis not present

## 2020-01-11 DIAGNOSIS — R410 Disorientation, unspecified: Secondary | ICD-10-CM | POA: Diagnosis present

## 2020-01-11 DIAGNOSIS — H269 Unspecified cataract: Secondary | ICD-10-CM | POA: Diagnosis present

## 2020-01-11 DIAGNOSIS — R269 Unspecified abnormalities of gait and mobility: Secondary | ICD-10-CM | POA: Diagnosis present

## 2020-01-11 DIAGNOSIS — R600 Localized edema: Secondary | ICD-10-CM | POA: Diagnosis present

## 2020-01-11 DIAGNOSIS — G629 Polyneuropathy, unspecified: Secondary | ICD-10-CM | POA: Diagnosis present

## 2020-01-11 DIAGNOSIS — G9341 Metabolic encephalopathy: Secondary | ICD-10-CM | POA: Diagnosis present

## 2020-01-11 DIAGNOSIS — Z7982 Long term (current) use of aspirin: Secondary | ICD-10-CM | POA: Diagnosis not present

## 2020-01-11 DIAGNOSIS — F10231 Alcohol dependence with withdrawal delirium: Secondary | ICD-10-CM | POA: Diagnosis not present

## 2020-01-11 DIAGNOSIS — L03011 Cellulitis of right finger: Secondary | ICD-10-CM | POA: Diagnosis not present

## 2020-01-11 DIAGNOSIS — L03113 Cellulitis of right upper limb: Principal | ICD-10-CM | POA: Diagnosis present

## 2020-01-11 DIAGNOSIS — R4182 Altered mental status, unspecified: Secondary | ICD-10-CM

## 2020-01-11 DIAGNOSIS — Z20822 Contact with and (suspected) exposure to covid-19: Secondary | ICD-10-CM | POA: Diagnosis present

## 2020-01-11 DIAGNOSIS — Z79899 Other long term (current) drug therapy: Secondary | ICD-10-CM

## 2020-01-11 DIAGNOSIS — F0390 Unspecified dementia without behavioral disturbance: Secondary | ICD-10-CM | POA: Diagnosis present

## 2020-01-11 DIAGNOSIS — F09 Unspecified mental disorder due to known physiological condition: Secondary | ICD-10-CM | POA: Diagnosis present

## 2020-01-11 DIAGNOSIS — Z808 Family history of malignant neoplasm of other organs or systems: Secondary | ICD-10-CM

## 2020-01-11 DIAGNOSIS — Z87891 Personal history of nicotine dependence: Secondary | ICD-10-CM

## 2020-01-11 DIAGNOSIS — R5381 Other malaise: Secondary | ICD-10-CM | POA: Diagnosis not present

## 2020-01-11 LAB — COMPREHENSIVE METABOLIC PANEL
ALT: 21 U/L (ref 0–44)
AST: 24 U/L (ref 15–41)
Albumin: 4.1 g/dL (ref 3.5–5.0)
Alkaline Phosphatase: 63 U/L (ref 38–126)
Anion gap: 8 (ref 5–15)
BUN: 23 mg/dL (ref 8–23)
CO2: 26 mmol/L (ref 22–32)
Calcium: 9.1 mg/dL (ref 8.9–10.3)
Chloride: 105 mmol/L (ref 98–111)
Creatinine, Ser: 0.86 mg/dL (ref 0.44–1.00)
GFR, Estimated: >60 mL/min (ref >60)
Glucose, Bld: 100 mg/dL — ABNORMAL HIGH (ref 70–99)
Potassium: 4.6 mmol/L (ref 3.5–5.1)
Sodium: 139 mmol/L (ref 135–145)
Total Bilirubin: 1.2 mg/dL (ref 0.3–1.2)
Total Protein: 7.8 g/dL (ref 6.5–8.1)

## 2020-01-11 LAB — RESPIRATORY PANEL BY RT PCR (FLU A&B, COVID)
Influenza A by PCR: NEGATIVE
Influenza B by PCR: NEGATIVE
SARS Coronavirus 2 by RT PCR: NEGATIVE

## 2020-01-11 LAB — URINALYSIS, ROUTINE W REFLEX MICROSCOPIC
Bacteria, UA: NONE SEEN
Bilirubin Urine: NEGATIVE
Glucose, UA: NEGATIVE mg/dL
Hgb urine dipstick: NEGATIVE
Ketones, ur: 20 mg/dL — AB
Leukocytes,Ua: NEGATIVE
Nitrite: NEGATIVE
Protein, ur: 30 mg/dL — AB
Specific Gravity, Urine: 1.025 (ref 1.005–1.030)
pH: 5 (ref 5.0–8.0)

## 2020-01-11 LAB — AMMONIA: Ammonia: 10 umol/L (ref 9–35)

## 2020-01-11 LAB — CBC WITH DIFFERENTIAL/PLATELET
Abs Immature Granulocytes: 0.04 10*3/uL (ref 0.00–0.07)
Basophils Absolute: 0 10*3/uL (ref 0.0–0.1)
Basophils Relative: 0 %
Eosinophils Absolute: 0 10*3/uL (ref 0.0–0.5)
Eosinophils Relative: 0 %
HCT: 47.1 % — ABNORMAL HIGH (ref 36.0–46.0)
Hemoglobin: 15.5 g/dL — ABNORMAL HIGH (ref 12.0–15.0)
Immature Granulocytes: 0 %
Lymphocytes Relative: 7 %
Lymphs Abs: 0.7 10*3/uL (ref 0.7–4.0)
MCH: 32.4 pg (ref 26.0–34.0)
MCHC: 32.9 g/dL (ref 30.0–36.0)
MCV: 98.3 fL (ref 80.0–100.0)
Monocytes Absolute: 0.8 10*3/uL (ref 0.1–1.0)
Monocytes Relative: 8 %
Neutro Abs: 8.4 10*3/uL — ABNORMAL HIGH (ref 1.7–7.7)
Neutrophils Relative %: 85 %
Platelets: 230 10*3/uL (ref 150–400)
RBC: 4.79 MIL/uL (ref 3.87–5.11)
RDW: 13.9 % (ref 11.5–15.5)
WBC: 9.9 10*3/uL (ref 4.0–10.5)
nRBC: 0 % (ref 0.0–0.2)

## 2020-01-11 LAB — LACTIC ACID, PLASMA: Lactic Acid, Venous: 1.7 mmol/L (ref 0.5–1.9)

## 2020-01-11 LAB — MRSA PCR SCREENING: MRSA by PCR: NEGATIVE

## 2020-01-11 LAB — APTT: aPTT: 34 seconds (ref 24–36)

## 2020-01-11 LAB — PROTIME-INR
INR: 1 (ref 0.8–1.2)
Prothrombin Time: 12.6 seconds (ref 11.4–15.2)

## 2020-01-11 MED ORDER — LACTATED RINGERS IV BOLUS (SEPSIS)
500.0000 mL | Freq: Once | INTRAVENOUS | Status: AC
  Administered 2020-01-11: 500 mL via INTRAVENOUS

## 2020-01-11 MED ORDER — SODIUM CHLORIDE 0.9 % IV SOLN: INTRAVENOUS | Status: AC

## 2020-01-11 MED ORDER — SODIUM CHLORIDE 0.9 % IV SOLN
3.0000 g | Freq: Four times a day (QID) | INTRAVENOUS | Status: DC
  Administered 2020-01-11 – 2020-01-14 (×11): 3 g via INTRAVENOUS
  Filled 2020-01-11: qty 3
  Filled 2020-01-11 (×2): qty 8
  Filled 2020-01-11 (×2): qty 3
  Filled 2020-01-11: qty 8
  Filled 2020-01-11 (×2): qty 3
  Filled 2020-01-11: qty 8
  Filled 2020-01-11 (×3): qty 3

## 2020-01-11 MED ORDER — VANCOMYCIN HCL IN DEXTROSE 1-5 GM/200ML-% IV SOLN
1000.0000 mg | Freq: Once | INTRAVENOUS | Status: AC
  Administered 2020-01-11: 1000 mg via INTRAVENOUS
  Filled 2020-01-11: qty 200

## 2020-01-11 MED ORDER — SODIUM CHLORIDE 0.9 % IV SOLN
3.0000 g | Freq: Once | INTRAVENOUS | Status: AC
  Administered 2020-01-11: 3 g via INTRAVENOUS
  Filled 2020-01-11: qty 8

## 2020-01-11 MED ORDER — ADULT MULTIVITAMIN W/MINERALS CH
1.0000 | ORAL_TABLET | Freq: Every day | ORAL | Status: DC
  Administered 2020-01-11 – 2020-01-20 (×9): 1 via ORAL
  Filled 2020-01-11 (×11): qty 1

## 2020-01-11 MED ORDER — HYDRALAZINE HCL 20 MG/ML IJ SOLN
5.0000 mg | Freq: Four times a day (QID) | INTRAMUSCULAR | Status: DC | PRN
  Administered 2020-01-11: 5 mg via INTRAVENOUS
  Filled 2020-01-11: qty 1

## 2020-01-11 MED ORDER — VANCOMYCIN HCL IN DEXTROSE 1-5 GM/200ML-% IV SOLN: 1000.0000 mg | Freq: Once | INTRAVENOUS | Status: DC

## 2020-01-11 MED ORDER — ENOXAPARIN SODIUM 40 MG/0.4ML ~~LOC~~ SOLN
40.0000 mg | SUBCUTANEOUS | Status: DC
  Administered 2020-01-11 – 2020-01-19 (×8): 40 mg via SUBCUTANEOUS
  Filled 2020-01-11 (×8): qty 0.4

## 2020-01-11 MED ORDER — SODIUM CHLORIDE 0.9 % IV SOLN: 2.0000 g | Freq: Once | INTRAVENOUS | Status: DC

## 2020-01-11 MED ORDER — SACCHAROMYCES BOULARDII 250 MG PO CAPS
250.0000 mg | ORAL_CAPSULE | Freq: Two times a day (BID) | ORAL | Status: DC
  Administered 2020-01-12 – 2020-01-20 (×15): 250 mg via ORAL
  Filled 2020-01-11 (×18): qty 1

## 2020-01-11 MED ORDER — LACTATED RINGERS IV SOLN: INTRAVENOUS | Status: DC

## 2020-01-11 MED ORDER — ASPIRIN EC 81 MG PO TBEC
81.0000 mg | DELAYED_RELEASE_TABLET | Freq: Every day | ORAL | Status: DC
  Administered 2020-01-11 – 2020-01-20 (×9): 81 mg via ORAL
  Filled 2020-01-11 (×10): qty 1

## 2020-01-11 MED ORDER — ACETAMINOPHEN 325 MG PO TABS
650.0000 mg | ORAL_TABLET | Freq: Four times a day (QID) | ORAL | Status: DC | PRN
  Administered 2020-01-11: 650 mg via ORAL
  Filled 2020-01-11: qty 2

## 2020-01-11 MED ORDER — GABAPENTIN 100 MG PO CAPS
200.0000 mg | ORAL_CAPSULE | Freq: Two times a day (BID) | ORAL | Status: DC
  Administered 2020-01-11 – 2020-01-20 (×16): 200 mg via ORAL
  Filled 2020-01-11 (×19): qty 2

## 2020-01-11 MED ORDER — VANCOMYCIN HCL 750 MG/150ML IV SOLN: 750.0000 mg | INTRAVENOUS | Status: DC

## 2020-01-11 MED ORDER — ONDANSETRON HCL 4 MG/2ML IJ SOLN: 4.0000 mg | Freq: Four times a day (QID) | INTRAMUSCULAR | Status: DC | PRN

## 2020-01-11 NOTE — ED Provider Notes (Signed)
5:18 PM signout from [facility] at shift change.  Patient from home where she lives independently.  She has developed altered mental status and cellulitis of her right hand and forearm.  Plan for admission.  Patient has received IV Unasyn.  Head CT was negative.  Lab work is reassuring, although UA has not yet resulted.  Patient has a family member en route who is not yet here. Calling for admission.   BP (!) 190/95 (BP Location: Right Arm)   Pulse 66   Temp (!) 97.1 F (36.2 C) (Rectal)   Resp 18   SpO2 100%   5:27 PM Spoke with Dr. Margo Aye who will see patient.   BP (!) 182/85   Pulse 66   Temp (!) 97.1 F (36.2 C) (Rectal)   Resp 19   SpO2 100%     Renne Crigler, PA-C 01/11/20 1727    Charlynne Pander, MD 01/11/20 2024

## 2020-01-11 NOTE — ED Notes (Signed)
Per Margo Aye DO, discontinued second lactic acid.

## 2020-01-11 NOTE — ED Notes (Signed)
Notified provider of high BP

## 2020-01-11 NOTE — Progress Notes (Signed)
Pharmacy Antibiotic Note  Amy Whitehead is a 86 y.o. female admitted on 01/11/2020 with cellulitis.  Pharmacy has been consulted for vancomycin dosing.  Plan: Vancomycin 1000 mg IV x1 then 750 mg IV q24h  Per consult, can DC vancomycin if MRSA negative     Temp (24hrs), Avg:97.3 F (36.3 C), Min:97.1 F (36.2 C), Max:97.5 F (36.4 C)  Recent Labs  Lab 01/11/20 1436  WBC 9.9  CREATININE 0.86  LATICACIDVEN 1.7    CrCl cannot be calculated (Unknown ideal weight.).    No Known Allergies     Thank you for allowing pharmacy to be a part of this patient's care.   Adalberto Cole, PharmD, BCPS 01/11/2020 7:42 PM

## 2020-01-11 NOTE — H&P (Signed)
History and Physical  Amy Whitehead IWP:[id number] DOB: 1934-01-04 DOA: 01/11/2020  Referring physician: Emmit Alexanders EDP PCP: Arnette Felts, FNP  Outpatient Specialists: None Patient coming from: Home, lives independently.  Chief Complaint: Confusion   HPI: Amy Whitehead is a 86 y.o. female with medical history significant for polyneuropathy on gabapentin who presented to [facility] ED due to confusion at home where she lives alone and independently.  History is mainly obtained from EDP and review of medical records.  She is pleasantly confused at the time of this visit.  No family members in the room.  Patient arrived via EMS from home for altered mental status.  Patient's friend did not see her at church on the day of presentation so called EMS who found her at home confused.  She reports that her dog scratched her right hand a few days prior.  She has had pain and swelling in that area but has not received any treatment or evaluation.  Upon assessment of her right hand in the ED, it appears moderately to severely edematous with erythema, warmth, and tenderness consistent with cellulitis.  She is afebrile and has a normal white count.   Received Unasyn in the ED.  TRH, hospitalist team was asked to admit.    ED Course: Vital signs and labs essentially unremarkable.  CT head unremarkable for any acute intracranial findings.  UA is pending, patient has not voided.  Review of Systems: Review of systems as noted in the HPI. All other systems reviewed and are negative.   Past Medical History:  Diagnosis Date  . Arthritis   . Cataract   . Post herpetic neuralgia 04/17/2011   Past Surgical History:  Procedure Laterality Date  . 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  Unsure if uterus was removed, but menses stopped after surgery    Social History:  reports that she quit smoking about 57 years ago. Her smoking use included cigarettes. She has  never used smokeless tobacco. She reports that she does not drink alcohol and does not use drugs.   No Known Allergies  Family History  Problem Relation Age of Onset  . Cancer Father        brain tumor  . Cancer Sister        lung  . Diabetes Sister       Prior to Admission medications   Medication Sig Start Date End Date Taking? Authorizing Provider  aspirin EC 81 MG tablet Take 81 mg by mouth daily.   Yes [provider]  Calcium-Vitamin D (CALTRATE 600 PLUS-VIT D PO) Take 2 tablets by mouth 2 (two) times daily.   Yes [provider]  gabapentin (NEURONTIN) 300 MG capsule TAKE 3 CAPSULES BY MOUTH THREE TIMES DAILY Patient taking differently: Take 900 mg by mouth 3 (three) times daily.  12/11/19  Yes Arnette Felts, FNP  Multiple Vitamin (MULTIVITAMIN WITH MINERALS) TABS tablet Take 1 tablet by mouth daily.   Yes [provider]  Naproxen Sodium (ALEVE PO) Take 1 tablet by mouth daily as needed (pain).    Yes [provider]    Physical Exam: BP (!) 188/121   Pulse 65   Temp (!) 97.1 F (36.2 C) (Rectal)   Resp 17   SpO2 100%   . General: 86 y.o. year-old female well developed well nourished in no acute distress.  Alert and pleasantly confused. . Cardiovascular: Regular rate and rhythm  with no rubs or gallops.  No thyromegaly or JVD noted.  No lower extremity edema. 2/4 pulses in all 4 extremities. Marland Kitchen Respiratory: Clear to auscultation with no wheezes or rales. Good inspiratory effort. . Abdomen: Soft nontender nondistended with normal bowel sounds x4 quadrants. . Muskuloskeletal: No cyanosis, clubbing or edema noted bilaterally . Neuro: CN II-XII intact, strength, sensation, reflexes . Skin: Right hand significantly edematous, erythematous, warm, and tender. Marland Kitchen Psychiatry: Judgement and insight appear altered. Mood is appropriate for condition and setting          Labs on Admission:  Basic Metabolic Panel: Recent Labs  Lab  01/11/20 1436  NA 139  K 4.6  CL 105  CO2 26  GLUCOSE 100*  BUN 23  CREATININE 0.86  CALCIUM 9.1   Liver Function Tests: Recent Labs  Lab 01/11/20 1436  AST 24  ALT 21  ALKPHOS 63  BILITOT 1.2  PROT 7.8  ALBUMIN 4.1   No results for input(s): LIPASE, AMYLASE in the last 168 hours. Recent Labs  Lab 01/11/20 1437  AMMONIA 10   CBC: Recent Labs  Lab 01/11/20 1436  WBC 9.9  NEUTROABS 8.4*  HGB 15.5*  HCT 47.1*  MCV 98.3  PLT 230   Cardiac Enzymes: No results for input(s): CKTOTAL, CKMB, CKMBINDEX, TROPONINI in the last 168 hours.  BNP (last 3 results) No results for input(s): BNP in the last 8760 hours.  ProBNP (last 3 results) No results for input(s): PROBNP in the last 8760 hours.  CBG: No results for input(s): GLUCAP in the last 168 hours.  Radiological Exams on Admission: DG Forearm Right  Result Date: 01/11/2020 CLINICAL DATA:  Dog bite EXAM: RIGHT HAND - COMPLETE 3+ VIEW; RIGHT WRIST - COMPLETE 3+ VIEW; RIGHT FOREARM - 2 VIEW COMPARISON:  None. FINDINGS: Osteopenia. No acute fracture or dislocation. There are advanced degenerative changes at the first [facility]. There are severe degenerative changes of the second and third MCPs. Advanced degenerative changes throughout the DIPs and fifth PIP. No area of erosion or osseous destruction. No unexpected radiopaque foreign body. Soft tissue edema of the hand. Soft tissues are otherwise unremarkable. IMPRESSION: 1. No acute fracture or dislocation. 2. Soft tissue edema of the hand. No unexpected radiopaque foreign body. 3. Advanced degenerative changes of the right hand, as described. Electronically Signed   By: Meda Klinefelter MD   On: 01/11/2020 16:45   DG Wrist Complete Right  Result Date: 01/11/2020 CLINICAL DATA:  Dog bite EXAM: RIGHT HAND - COMPLETE 3+ VIEW; RIGHT WRIST - COMPLETE 3+ VIEW; RIGHT FOREARM - 2 VIEW COMPARISON:  None. FINDINGS: Osteopenia. No acute fracture or dislocation. There are advanced  degenerative changes at the first [facility]. There are severe degenerative changes of the second and third MCPs. Advanced degenerative changes throughout the DIPs and fifth PIP. No area of erosion or osseous destruction. No unexpected radiopaque foreign body. Soft tissue edema of the hand. Soft tissues are otherwise unremarkable. IMPRESSION: 1. No acute fracture or dislocation. 2. Soft tissue edema of the hand. No unexpected radiopaque foreign body. 3. Advanced degenerative changes of the right hand, as described. Electronically Signed   By: Meda Klinefelter MD   On: 01/11/2020 16:45   CT Head Wo Contrast  Result Date: 01/11/2020 CLINICAL DATA:  Altered mental status.  Recent dog bite. EXAM: CT HEAD WITHOUT CONTRAST TECHNIQUE: Contiguous axial images were obtained from the base of the skull through the vertex without intravenous contrast. COMPARISON:  None. FINDINGS: Brain: There is  no evidence of an acute infarct, intracranial hemorrhage, mass, midline shift, or extra-axial fluid collection. Cerebral atrophy is within normal limits for age. Vascular: Calcified atherosclerosis at the skull base. No hyperdense vessel. Skull: No fracture or suspicious osseous lesion. Sinuses/Orbits: Visualized paranasal sinuses and mastoid air cells are clear. No acute finding in the visualized portions of the orbits. Other: None. IMPRESSION: Unremarkable CT appearance of the brain for age. Electronically Signed   By: Sebastian AcheAllen  Grady M.D.   On: 01/11/2020 15:17   DG Chest Port 1 View  Result Date: 01/11/2020 CLINICAL DATA:  Sepsis EXAM: PORTABLE CHEST 1 VIEW COMPARISON:  None. FINDINGS: The cardiomediastinal silhouette is mildly enlarged. Likely hiatal hernia. LEFT basilar atelectasis. On no focal consolidation. No pleural effusion or pneumothorax. Degenerative changes of the thoracic spine. IMPRESSION: 1. LEFT basilar atelectasis. No focal consolidation to suggest pneumonia. 2. Likely hiatal hernia. Electronically Signed   By:  Meda KlinefelterStephanie  Peacock MD   On: 01/11/2020 16:46   DG Hand Complete Right  Result Date: 01/11/2020 CLINICAL DATA:  Dog bite EXAM: RIGHT HAND - COMPLETE 3+ VIEW; RIGHT WRIST - COMPLETE 3+ VIEW; RIGHT FOREARM - 2 VIEW COMPARISON:  None. FINDINGS: Osteopenia. No acute fracture or dislocation. There are advanced degenerative changes at the first [facility]. There are severe degenerative changes of the second and third MCPs. Advanced degenerative changes throughout the DIPs and fifth PIP. No area of erosion or osseous destruction. No unexpected radiopaque foreign body. Soft tissue edema of the hand. Soft tissues are otherwise unremarkable. IMPRESSION: 1. No acute fracture or dislocation. 2. Soft tissue edema of the hand. No unexpected radiopaque foreign body. 3. Advanced degenerative changes of the right hand, as described. Electronically Signed   By: Meda KlinefelterStephanie  Peacock MD   On: 01/11/2020 16:45    EKG: I independently viewed the EKG done and my findings are as followed: Sinus rhythm rate of 65.  No specific ST-T changes.  Assessment/Plan Present on Admission: . AMS (altered mental status)  Active Problems:   AMS (altered mental status)  Acute metabolic encephalopathy encephalopathy suspect secondary to acute infective process, right hand cellulitis CT head no contrast done on 01/11/2020 no evidence of acute intracranial findings. Exam with no focal findings, motor and sensation were intact. Obtain urine analysis and urine culture Delirium precautions Neurochecks Fall precautions  Right hand cellulitis, possibly associated with dog scratch, rule out MRSA infection. Received a dose of Unasyn in the ED Obtain MRSA screening Start IV vancomycin and continue IV fluid Pain control Follow blood cultures Mark cellulitic area with skin marker for monitoring of resolution Currently afebrile with no leukocytosis Monitor fever curve and WBCs Obtain CBC with differential in the morning Continue to monitor  vital signs  Acute urinary retention Obtain bladder scan In and out cath as needed Send urine for analysis with urine culture Continue IV fluid  Elevated BP, possibly related to pain No oral antihypertensives seen in her list of home medications Initial blood pressure on presentation of 159/69 and gradually worsened Possibly associated with pain Pain control for cellulitis IV hydralazine as needed with parameters Continue to monitor vital signs  Polyneuropathy Resume home gabapentin  DVT prophylaxis: Subcu Lovenox daily  Code Status: Full code, patient does not have capacity to make decision on CODE STATUS at this time due to altered mental status.  Revisit CODE STATUS when altered mental status has resolved.  Family Communication: None at bedside.    Disposition Plan: Admit to telemetry unit  Consults called: None.  Admission status: Inpatient status.  Patient will require at least 2 midnights for further evaluation and treatment of present condition.   Status is: Inpatient    Dispo:  Patient From: Home  Planned Disposition: Home  Expected discharge date: 01/13/20  Medically stable for discharge: No, ongoing management of acute metabolic encephalopathy, right hand cellulitis, acute urinary retention.        Darlin Drop MD [facility] Pager [phone]  If 7PM-7AM, please contact night-coverage [email or web address] Password [email or web address]  01/11/2020, 6:27 PM

## 2020-01-11 NOTE — ED Triage Notes (Signed)
Patient coming from home with c/o AMS. Her friend report that she is AMSthan normal she did not coming to church this morning. Patient reported that she had dog bit on her right hand couple of days ago. Patient right hand is swollen and tender to touch. She is A/O x 2.

## 2020-01-11 NOTE — ED Provider Notes (Addendum)
[facility]-EMERGENCY DEPT Provider Note   CSN: [id number] Arrival date & time: 01/11/20  1403     History No chief complaint on file.   Amy Whitehead is a 86 y.o. female history obtained from triage RN, EMS has left prior to my evaluation.  Patient arrived via EMS for altered mental status, patient's friend did not see her at church this morning so called EMS who found patient at home.  Patient is alert to self and place but not to time or event.  She reports that her dog Molly scratched her hand a few days ago she has had pain and swelling at that area but has not received any treatment or prior evaluation.  Level 5 caveat altered mental status.  HPI     Past Medical History:  Diagnosis Date  . Arthritis   . Cataract   . Post herpetic neuralgia 04/17/2011    Patient Active Problem List   Diagnosis Date Noted  . Nocturia 03/12/2015  . Varicose veins 07/18/2014  . Plantar fasciitis of left foot 04/17/2011  . Post herpetic neuralgia 04/17/2011  . Health care maintenance 04/17/2011    Past Surgical History:  Procedure Laterality Date  . 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  Unsure if uterus was removed, but menses stopped after surgery     OB History   No obstetric history on file.     Family History  Problem Relation Age of Onset  . Cancer Father        brain tumor  . Cancer Sister        lung  . Diabetes Sister     Social History   Tobacco Use  . Smoking status: Former Smoker    Types: Cigarettes    Quit date: 04/16/1962    Years since quitting: 57.7  . Smokeless tobacco: Never Used  . Tobacco comment: No plans to start again  Vaping Use  . Vaping Use: Never used  Substance Use Topics  . Alcohol use: No  . Drug use: No    Home Medications Prior to Admission medications   Medication Sig Start Date End Date Taking? Authorizing Provider  aspirin EC 81 MG tablet Take  81 mg by mouth daily.    [provider]  Calcium-Vitamin D (CALTRATE 600 PLUS-VIT D PO) Take 2 tablets by mouth 2 (two) times daily.    [provider]  Cholecalciferol (VITAMIN D3) 100000 UNIT/GM POWD Take by mouth.    [provider]  estradiol (ESTRACE) 0.1 MG/GM vaginal cream Place pea sized amount in the vagina nightly for 3 weeks and then place pea sized amount in the vagina every other night 11/26/19   [provider]  gabapentin (NEURONTIN) 300 MG capsule TAKE 3 CAPSULES BY MOUTH THREE TIMES DAILY 12/11/19   Arnette Felts, FNP  Multiple Vitamins-Minerals (MULTIVITAMIN WITH MINERALS) tablet Take 1 tablet by mouth daily.    [provider]  Multiple Vitamins-Minerals (VISION-VITE PRESERVE PO) Take 2 tablets by mouth daily.    [provider]  Naproxen Sodium (ALEVE PO) Take by mouth.    [provider]    Allergies    Patient has no known allergies.  Review of Systems   Review of Systems  Unable to perform ROS: Mental status change    Physical Exam Updated Vital Signs BP (!) 159/69   Temp (!) 97.5 F (36.4 C) (Oral)  Resp 16   SpO2 100%   Physical Exam Constitutional:      General: She is not in acute distress.    Appearance: Normal appearance. She is well-developed. She is not ill-appearing or diaphoretic.  HENT:     Head: Normocephalic and atraumatic.  Eyes:     General: Vision grossly intact. Gaze aligned appropriately.     Pupils: Pupils are equal, round, and reactive to light.  Neck:     Trachea: Trachea and phonation normal.  Pulmonary:     Effort: Pulmonary effort is normal. No respiratory distress.  Abdominal:     General: There is no distension.     Palpations: Abdomen is soft.     Tenderness: There is no abdominal tenderness. There is no guarding or rebound.  Musculoskeletal:        General: Normal range of motion.     Cervical back: Normal range of motion.     Comments: No midline C/T/L  spinal tenderness to palpation, no paraspinal muscle tenderness, no deformity, crepitus, or step-off noted. No sign of injury to the neck or back. ----- Right hand: Abrasion present to the dorsum of the right hand between index and thumb.  Significant swelling extending from the dorsum of the hand down to the proximal forearm with erythema.  No gross deformities.  Fingers swollen, range of motion intact.  Motion at the fingers and wrist are intact but limited secondary to pain.  Sensation intact to all fingers.  Capillary refill intact.  Movement at the shoulder and elbow intact. - Left arm and bilateral legs palpated mobilized without pain.  Pelvis stable to compression bilateral without pain.  No pain with knee-to-chest movement.  Skin:    General: Skin is warm and dry.  Neurological:     Mental Status: She is alert.     GCS: GCS eye subscore is 4. GCS verbal subscore is 4. GCS motor subscore is 6.     Comments: Speech is clear, confused, follows basic commands.  When answering questions she quickly loses train of thought.  Alert to self and place only.  Major Cranial nerves without deficit, no facial droop Moves extremities without ataxia, coordination intact  Psychiatric:        Behavior: Behavior normal.     ED Results / Procedures / Treatments   Labs (all labs ordered are listed, but only abnormal results are displayed) Labs Reviewed  URINE CULTURE  CULTURE, BLOOD (ROUTINE X 2)  CULTURE, BLOOD (ROUTINE X 2)  RESPIRATORY PANEL BY RT PCR (FLU A&B, COVID)  LACTIC ACID, PLASMA  LACTIC ACID, PLASMA  COMPREHENSIVE METABOLIC PANEL  CBC WITH DIFFERENTIAL/PLATELET  PROTIME-INR  APTT  URINALYSIS, ROUTINE W REFLEX MICROSCOPIC  ETHANOL  AMMONIA    EKG None  Radiology No results found.  Procedures Procedures (including critical care time)  Medications Ordered in ED Medications  lactated ringers infusion (has no administration in time range)  vancomycin (VANCOCIN) IVPB 1000  mg/200 mL premix (has no administration in time range)  cefTRIAXone (ROCEPHIN) 2 g in sodium chloride 0.9 % 100 mL IVPB (has no administration in time range)  lactated ringers bolus 500 mL (has no administration in time range)    ED Course  I have reviewed the triage vital signs and the nursing notes.  Pertinent labs & imaging results that were available during my care of the patient were reviewed by me and considered in my medical decision making (see chart for details).  Clinical Course as of Jan 11 1440  Sun Jan 11, 2020  1438 Sarina Ill (Daughter)  [phone] [facility])   [BM]    Clinical Course User Index [BM] Elizabeth Palau   MDM Rules/Calculators/A&P                         Additional history obtained from: 1. Nursing notes from this visit. 2. Review of electronic medical records.  No recent pertinent ER visits.  No PCP visits related to dog scratch of the hand. 3. Family, spoke to [employer] patient's daughter over the phone.  She reports patient is normally fully alert and oriented and takes care of herself this mental status change is new.  She is on her way and she did arrive around 5:30 PM. ----------------------- 86 year old female with obvious cellulitis of the right hand following a dog scratch.  She is altered, alert and oriented x2.  Did not show picture today so her friend called EMS to find her.  As she has no evidence of injury of the head neck back chest abdomen pelvis lower extremities or left upper extremity.  She has the cellulitis of the right hand with extensive swelling extending down almost to the elbow.  Capillary refill and sensation as well as radial pulses intact.  She is afebrile on arrival slightly hypothermic at 36.4 C.  No tachycardia hypotension tachypnea or hypoxia.  Does not currently meet SIRS criteria however evolving sepsis order set was utilized and will obtain all necessary lab work.  Empiric antibiotics ordered after  pharmacy consultation Unasyn was chosen.  Will obtain x-rays of the hand wrist and forearm.  Additionally will obtain CT head for altered mental status of unclear cause.  She has no cranial nerve deficits or unilateral weakness on exam.  I have asked nursing staff to give antibiotics, fluid and obtain urine sample.  Discussed plan of care with Dr. Freida Busman who agrees with work-up. - Care handoff given to Rhea Bleacher PA-C at shift change who will follow up on pending studies and likely admit.  Note: Portions of this report may have been transcribed using voice recognition software. Every effort was made to ensure accuracy; however, inadvertent computerized transcription errors may still be present. Final Clinical Impression(s) / ED Diagnoses Final diagnoses:  None    Rx / DC Orders ED Discharge Orders    None       Bill Salinas, PA-C 01/11/20 1519    Elizabeth Palau 01/11/20 1521    Lorre Nick, MD 01/16/20 1355

## 2020-01-12 ENCOUNTER — Other Ambulatory Visit: Payer: Self-pay

## 2020-01-12 ENCOUNTER — Encounter (HOSPITAL_COMMUNITY): Payer: Self-pay | Admitting: Internal Medicine

## 2020-01-12 DIAGNOSIS — R4182 Altered mental status, unspecified: Secondary | ICD-10-CM | POA: Diagnosis not present

## 2020-01-12 LAB — CBC WITH DIFFERENTIAL/PLATELET
Abs Immature Granulocytes: 0.02 10*3/uL (ref 0.00–0.07)
Basophils Absolute: 0 10*3/uL (ref 0.0–0.1)
Basophils Relative: 0 %
Eosinophils Absolute: 0 10*3/uL (ref 0.0–0.5)
Eosinophils Relative: 1 %
HCT: 42.2 % (ref 36.0–46.0)
Hemoglobin: 13.8 g/dL (ref 12.0–15.0)
Immature Granulocytes: 0 %
Lymphocytes Relative: 10 %
Lymphs Abs: 0.7 10*3/uL (ref 0.7–4.0)
MCH: 32.1 pg (ref 26.0–34.0)
MCHC: 32.7 g/dL (ref 30.0–36.0)
MCV: 98.1 fL (ref 80.0–100.0)
Monocytes Absolute: 0.8 10*3/uL (ref 0.1–1.0)
Monocytes Relative: 11 %
Neutro Abs: 5.7 10*3/uL (ref 1.7–7.7)
Neutrophils Relative %: 78 %
Platelets: 207 10*3/uL (ref 150–400)
RBC: 4.3 MIL/uL (ref 3.87–5.11)
RDW: 14 % (ref 11.5–15.5)
WBC: 7.3 10*3/uL (ref 4.0–10.5)
nRBC: 0 % (ref 0.0–0.2)

## 2020-01-12 LAB — COMPREHENSIVE METABOLIC PANEL
ALT: 18 U/L (ref 0–44)
AST: 26 U/L (ref 15–41)
Albumin: 3.1 g/dL — ABNORMAL LOW (ref 3.5–5.0)
Alkaline Phosphatase: 51 U/L (ref 38–126)
Anion gap: 8 (ref 5–15)
BUN: 18 mg/dL (ref 8–23)
CO2: 23 mmol/L (ref 22–32)
Calcium: 8.5 mg/dL — ABNORMAL LOW (ref 8.9–10.3)
Chloride: 108 mmol/L (ref 98–111)
Creatinine, Ser: 0.57 mg/dL (ref 0.44–1.00)
GFR, Estimated: >60 mL/min (ref >60)
Glucose, Bld: 84 mg/dL (ref 70–99)
Potassium: 3.6 mmol/L (ref 3.5–5.1)
Sodium: 139 mmol/L (ref 135–145)
Total Bilirubin: 1.2 mg/dL (ref 0.3–1.2)
Total Protein: 6.1 g/dL — ABNORMAL LOW (ref 6.5–8.1)

## 2020-01-12 LAB — ETHANOL: Alcohol, Ethyl (B): <10 mg/dL (ref <10)

## 2020-01-12 MED ORDER — HALOPERIDOL LACTATE 5 MG/ML IJ SOLN
2.0000 mg | Freq: Once | INTRAMUSCULAR | Status: AC
  Administered 2020-01-12: 2 mg via INTRAMUSCULAR

## 2020-01-12 MED ORDER — HALOPERIDOL LACTATE 5 MG/ML IJ SOLN
INTRAMUSCULAR | Status: AC
  Filled 2020-01-12: qty 1

## 2020-01-12 MED ORDER — HALOPERIDOL LACTATE 5 MG/ML IJ SOLN: 2.0000 mg | Freq: Once | INTRAMUSCULAR | Status: DC

## 2020-01-12 MED ORDER — QUETIAPINE FUMARATE 25 MG PO TABS
12.5000 mg | ORAL_TABLET | Freq: Two times a day (BID) | ORAL | Status: DC
  Administered 2020-01-12 – 2020-01-17 (×8): 12.5 mg via ORAL
  Filled 2020-01-12 (×10): qty 1

## 2020-01-12 MED ORDER — HALOPERIDOL LACTATE 5 MG/ML IJ SOLN
2.0000 mg | Freq: Three times a day (TID) | INTRAMUSCULAR | Status: DC | PRN
  Administered 2020-01-14: 2 mg via INTRAMUSCULAR
  Filled 2020-01-12: qty 1

## 2020-01-12 MED ORDER — QUETIAPINE FUMARATE 25 MG PO TABS: 25.0000 mg | ORAL_TABLET | Freq: Two times a day (BID) | ORAL | Status: DC

## 2020-01-12 MED ORDER — HALOPERIDOL LACTATE 5 MG/ML IJ SOLN: 1.0000 mg | Freq: Once | INTRAMUSCULAR | Status: DC

## 2020-01-12 MED ORDER — HALOPERIDOL 2 MG PO TABS
2.0000 mg | ORAL_TABLET | Freq: Three times a day (TID) | ORAL | Status: DC | PRN
  Filled 2020-01-12: qty 1

## 2020-01-12 NOTE — Progress Notes (Signed)
Patient became increasingly agitated around 1630. Tried for over 45 min to verbally calm/reorient patient. Screaming, pulling out IV's, pulled off telemetry. Charge RN Delice Bison came to room to assist me with patient. Patient was trying to walk out of hospital room & yelling that she was leaving etc. (confused). Explained several times that pt was not safe or able to leave the hospital at this time. Daughter was called & she tried to reorient/calm pt -no improvement. MD notified-haldol  IM given-no improvement. Patient swatted at me twice, started scratching & kicking legs at RN's & continuing to scream.  Order was then placed for soft wrist restraints & belt for patients safety. Educated patient on this & patients door is left open. Will continue to monitor.

## 2020-01-12 NOTE — Evaluation (Signed)
Occupational Therapy Evaluation Patient Details Name: Amy Whitehead MRN: [id number] DOB: May 04, 1933 Today's Date: 01/12/2020    History of Present Illness Amy Whitehead is a 86 y.o. female with medical history significant for polyneuropathy who presented to [facility] ED due to confusion at home where she lives alone and independently.She reports that her dog scratched/bit her right hand a few days prior.  She has had pain and swelling in that area/cellulitis.   Clinical Impression   This 86 yo female admitted with above presents to acute OT with PLOF of being totally independent with basic ADLs, IADLs, and driving. She currently is setup/S-min A for basic ADLs and min guard A-min A for mobility. She will benefit from acute OT with follow up at SNF.    Follow Up Recommendations  SNF;Supervision/Assistance - 24 hour    Equipment Recommendations  Other (comment) (TBD next venue)       Precautions / Restrictions Precautions Precautions: Fall Restrictions Weight Bearing Restrictions: No      Mobility Bed Mobility Overal bed mobility: Needs Assistance Bed Mobility: Supine to Sit     Supine to sit: Min guard;HOB elevated          Transfers Overall transfer level: Needs assistance Equipment used: Rolling walker (2 wheeled) Transfers: Sit to/from Stand Sit to Stand: Min assist              Balance Overall balance assessment: Needs assistance Sitting-balance support: No upper extremity supported;Feet supported Sitting balance-Leahy Scale: Good     Standing balance support: Single extremity supported;During functional activity Standing balance-Leahy Scale: Poor                             ADL either performed or assessed with clinical judgement   ADL Overall ADL's : Needs assistance/impaired Eating/Feeding: Modified independent;Sitting Eating/Feeding Details (indicate cue type and reason): increased time due to right hand Grooming: Set  up;Supervision/safety;Sitting Grooming Details (indicate cue type and reason): increased time due to right hand Upper Body Bathing: Set up;Supervision/ safety;Sitting Upper Body Bathing Details (indicate cue type and reason): increased time due to right hand Lower Body Bathing: Minimal assistance;Sit to/from stand Lower Body Bathing Details (indicate cue type and reason): increased time due to right hand Upper Body Dressing : Supervision/safety;Set up;Sitting Upper Body Dressing Details (indicate cue type and reason): increased time due to right hand Lower Body Dressing: Minimal assistance;Sit to/from stand Lower Body Dressing Details (indicate cue type and reason): increased time due to right hand Toilet Transfer: Minimal assistance;Ambulation;RW Toilet Transfer Details (indicate cue type and reason): bed>ambulate in hallway>sit in recliner Toileting- Clothing Manipulation and Hygiene: Minimal assistance;Sit to/from stand Toileting - Clothing Manipulation Details (indicate cue type and reason): increased time due to right hand             Vision Baseline Vision/History: Wears glasses Wears Glasses: At all times Patient Visual Report: No change from baseline              Pertinent Vitals/Pain Pain Assessment: 0-10 Pain Score: 2  Pain Location: right hand--stiff Pain Intervention(s): Limited activity within patient's tolerance;Monitored during session     Hand Dominance Right   Extremity/Trunk Assessment Upper Extremity Assessment Upper Extremity Assessment: RUE deficits/detail RUE Deficits / Details: swollen, red (attempts to use functionally but is difficult at times depending on task);AROM limited digit flexion/extension and wrist flexion/extension, can oppose digits 1 to digits 2-4 easily, cannot quit get to digit 5, supination/pronation  WNL AROM RUE Coordination: decreased fine motor           Communication Communication Communication: No difficulties   Cognition  Arousal/Alertness: Awake/alert Behavior During Therapy: WFL for tasks assessed/performed Overall Cognitive Status: Impaired/Different from baseline Area of Impairment: Orientation;Following commands;Safety/judgement;Problem solving                 Orientation Level: Situation (delayed recall in where she is currenlty at)     Following Commands: Follows one step commands consistently Safety/Judgement: Decreased awareness of safety;Decreased awareness of deficits   Problem Solving: Requires verbal cues;Requires tactile cues                Home Living Family/patient expects to be discharged to:: Skilled nursing facility Living Arrangements: Alone Available Help at Discharge: Family Type of Home: Apartment Home Access: Stairs to enter [employer] of Steps: lives in 2nd floor condo Entrance Stairs-Rails: Can reach both Home Layout: One level     Bathroom Shower/Tub: Chief Strategy Officer: Standard     Home Equipment: None   Additional Comments: dtr lives 3 hours away, pt reports she has neighbors that can check in on her      Prior Functioning/Environment Level of Independence: Independent        Comments: Pt reports she does furniture walk, she has a Financial planner, drives        OT Problem List: Decreased strength;Decreased range of motion;Impaired balance (sitting and/or standing);Decreased cognition;Decreased safety awareness;Pain      OT Treatment/Interventions: Self-care/ADL training;Patient/family education;Balance training;DME and/or AE instruction;Cognitive remediation/compensation    OT Goals(Current goals can be found in the care plan section) Acute Rehab OT Goals Patient Stated Goal: to go home to her dog Molly OT Goal Formulation: With patient Time For Goal Achievement: 01/26/20 Potential to Achieve Goals: Good  OT Frequency: Min 2X/week           Co-evaluation PT/OT/SLP Co-Evaluation/Treatment: Yes Reason for  Co-Treatment: For patient/therapist safety;To address functional/ADL transfers PT goals addressed during session: Mobility/safety with mobility;Balance;Proper use of DME;Strengthening/ROM OT goals addressed during session: Strengthening/ROM;ADL's and self-care      AM-PAC OT "6 Clicks" Daily Activity     Outcome Measure Help from another person eating meals?: None Help from another person taking care of personal grooming?: A Little Help from another person toileting, which includes using toliet, bedpan, or urinal?: A Little Help from another person bathing (including washing, rinsing, drying)?: A Little Help from another person to put on and taking off regular upper body clothing?: A Little Help from another person to put on and taking off regular lower body clothing?: A Little 6 Click Score: 19   End of Session Equipment Utilized During Treatment: Gait belt;Rolling walker Nurse Communication: Mobility status  Activity Tolerance: Patient tolerated treatment well Patient left: in chair;with call bell/phone within reach;with chair alarm set  OT Visit Diagnosis: Unsteadiness on feet (R26.81);Other abnormalities of gait and mobility (R26.89);Other symptoms and signs involving cognitive function;Pain Pain - Right/Left: Right Pain - part of body: Hand                Time: 0454-0981 OT Time Calculation (min): 31 min Charges:  OT General Charges $OT Visit: 1 Visit OT Evaluation $OT Eval Moderate Complexity: 1 Mod  Ignacia Palma, OTR/L Acute [employer] Pager [phone] Office [phone]     Amy Whitehead 01/12/2020, 12:23 PM

## 2020-01-12 NOTE — Evaluation (Signed)
Physical Therapy Evaluation Patient Details Name: Amy Whitehead MRN: [id number] DOB: [birth year] Today's Date: 01/12/2020   History of Present Illness  86 y.o. female with medical history significant for polyneuropathy who presented to [facility] ED due to confusion at home where she lives alone and independently.She reports that her dog scratched/bit her right hand a few days prior.  She has had pain and swelling in that area/cellulitis.  Clinical Impression  On eval, pt required Min assist for mobility. Pt presents with general weakness, decreased activity tolerance, and impaired gait and balance. Pt is confused. Daughter was present during session. PT recommendation is for ST SNF for continued rehab. Will continue to follow and progress activity as tolerated.     Follow Up Recommendations SNF    Equipment Recommendations  None recommended by PT    Recommendations for Other Services       Precautions / Restrictions Precautions Precautions: Fall Restrictions Weight Bearing Restrictions: No      Mobility  Bed Mobility Overal bed mobility: Needs Assistance Bed Mobility: Supine to Sit     Supine to sit: Min guard;HOB elevated     General bed mobility comments: Increased time. Cues required.    Transfers Overall transfer level: Needs assistance Equipment used: Rolling walker (2 wheeled) Transfers: Sit to/from Stand Sit to Stand: Min assist         General transfer comment: 2 attempts to get to standing. x 1 without RW, x 1 with RW. Cues for safety, technique, hand placement. Wide BOS. Very unsteady.  Ambulation/Gait Ambulation/Gait assistance: Min assist Gait Distance (Feet): 75 Feet Assistive device: Rolling walker (2 wheeled) Gait Pattern/deviations: Step-through pattern;Decreased stride length     General Gait Details: Assist to stabilize throughout distance. Cues for safety, posture, RW proximity, step length. Unsteady.  Stairs            Wheelchair Mobility     Modified Rankin (Stroke Patients Only)       Balance Overall balance assessment: Needs assistance Sitting-balance support: No upper extremity supported;Feet supported Sitting balance-Leahy Scale: Good     Standing balance support: Bilateral upper extremity supported Standing balance-Leahy Scale: Poor                               Pertinent Vitals/Pain Pain Assessment: Faces Pain Score: 2  Faces Pain Scale: Hurts a little bit Pain Location: R hand Pain Descriptors / Indicators: Tightness;Discomfort Pain Intervention(s): Limited activity within patient's tolerance;Monitored during session    Home Living Family/patient expects to be discharged to:: Skilled nursing facility Living Arrangements: Alone Available Help at Discharge: Family Type of Home: Apartment Home Access: Stairs to enter Entrance Stairs-Rails: Can reach both Entrance Stairs-Number of Steps: lives in 2nd floor condo Home Layout: One level Home Equipment: None Additional Comments: dtr lives 3 hours away, pt reports she has neighbors that can check in on her    Prior Function Level of Independence: Independent         Comments: Pt reports she does furniture walk, she has a Financial planner, drives     Hand Dominance   Dominant Hand: Right    Extremity/Trunk Assessment   Upper Extremity Assessment Upper Extremity Assessment: Defer to OT evaluation RUE Deficits / Details: swollen, red (attempts to use functionally but is difficult at times depending on task);AROM limited digit flexion/extension and wrist flexion/extension, can oppose digits 1 to digits 2-4 easily, cannot quit get to digit 5,  supination/pronation WNL AROM RUE Coordination: decreased fine motor    Lower Extremity Assessment Lower Extremity Assessment: Generalized weakness    Cervical / Trunk Assessment Cervical / Trunk Assessment: Normal  Communication   Communication: No difficulties  Cognition Arousal/Alertness:  Awake/alert Behavior During Therapy: WFL for tasks assessed/performed Overall Cognitive Status: Impaired/Different from baseline Area of Impairment: Orientation;Following commands;Safety/judgement;Problem solving                 Orientation Level: Situation     Following Commands: Follows one step commands consistently Safety/Judgement: Decreased awareness of safety;Decreased awareness of deficits   Problem Solving: Requires verbal cues;Requires tactile cues        General Comments      Exercises     Assessment/Plan    PT Assessment Patient needs continued PT services  PT Problem List Decreased strength;Decreased mobility;Decreased balance;Decreased knowledge of use of DME;Decreased activity tolerance       PT Treatment Interventions DME instruction;Gait training;Therapeutic activities;Therapeutic exercise;Patient/family education;Balance training;Functional mobility training    PT Goals (Current goals can be found in the Care Plan section)  Acute Rehab PT Goals Patient Stated Goal: to go home to her dog Kirt Boys PT Goal Formulation: With patient/family Time For Goal Achievement: 01/26/20 Potential to Achieve Goals: Good    Frequency Min 3X/week   Barriers to discharge Decreased caregiver support      Co-evaluation   Reason for Co-Treatment: For patient/therapist safety;To address functional/ADL transfers PT goals addressed during session: Mobility/safety with mobility;Balance;Proper use of DME OT goals addressed during session: Strengthening/ROM;ADL's and self-care       AM-PAC PT "6 Clicks" Mobility  Outcome Measure Help needed turning from your back to your side while in a flat bed without using bedrails?: A Little Help needed moving from lying on your back to sitting on the side of a flat bed without using bedrails?: A Little Help needed moving to and from a bed to a chair (including a wheelchair)?: A Little Help needed standing up from a chair using  your arms (e.g., wheelchair or bedside chair)?: A Little Help needed to walk in hospital room?: A Little Help needed climbing 3-5 steps with a railing? : A Lot 6 Click Score: 17    End of Session Equipment Utilized During Treatment: Gait belt Activity Tolerance: Patient tolerated treatment well Patient left: in chair;with call bell/phone within reach;with chair alarm set;with family/visitor present   PT Visit Diagnosis: Muscle weakness (generalized) (M62.81);Difficulty in walking, not elsewhere classified (R26.2)    Time: 1275-1700 PT Time Calculation (min) (ACUTE ONLY): 22 min   Charges:   PT Evaluation $PT Eval Moderate Complexity: 1 Mod            Faye Ramsay, PT Acute Rehabilitation  Office: [phone] Pager: [phone]

## 2020-01-12 NOTE — Progress Notes (Signed)
PROGRESS NOTE  Amy Whitehead PTW:[id number] DOB: 27-Jul-1933 DOA: 01/11/2020 PCP: Arnette Felts, FNP  HPI/Recap of past 24 hours:  Amy Whitehead is a 86 y.o. female with medical history significant for polyneuropathy on gabapentin who presented to [facility] ED due to confusion at home where she lives alone and independently.  History is mainly obtained from EDP and review of medical records.  She is pleasantly confused at the time of this visit.  No family members in the room.  Patient arrived via EMS from home for altered mental status.  Patient's friend did not see her at church on the day of presentation so called EMS who found her at home confused.  She reports that her dog scratched her right hand a few days prior.  She has had pain and swelling in that area but has not received any treatment or evaluation.  Upon assessment of her right hand in the ED, it appears moderately to severely edematous with erythema, warmth, and tenderness consistent with cellulitis.  She is afebrile and has a normal white count.   Received Unasyn in the ED.  TRH, hospitalist team was asked to admit.    ED Course: Vital signs and labs essentially unremarkable.  CT head unremarkable for any acute intracranial findings.  UA is pending, patient has not voided.  01/12/20: R hand cellulitis improving but still significantly edematous with erythema, warmth, and tenderness.  Confused this AM.  Will benefit from additional 48 hrs of abx then switch to oral abx on 11/17 for dc planning.   Her daughter in the room, states that she has had periods of confusion on and off in the past 6 months; has not been diagnosed with dementia, she lives alone.    Assessment/Plan: Active Problems:   AMS (altered mental status)  Acute metabolic encephalopathy, persistent, suspect secondary to acute infective process, right hand cellulitis CT head no contrast done on 01/11/2020 no evidence of acute intracranial findings. Exam with no focal findings,  motor and sensation were intact. UA no evidence of bacteria or nitrite but UA wbc 6-10.  Ucx in process.  Continue current management. Her daughter in the room, states that she has had periods of confusion on and off in the past 6 months; has not been diagnosed with dementia. Continue Delirium precautions Continue Fall precautions  Right hand cellulitis, possibly associated with dog scratch, MRSA screening test negative. Received a dose of Unasyn in the ED and 1 dose of IV vancomycin. Continue IV unasyn 3 gm q6H MRSA screening negative Continue IV fluid Continue Pain control Follow blood cultures, negative to date Mark cellulitic area with skin marker for monitoring of resolution Currently afebrile with no leukocytosis  Acute urinary retention Monitor UO Has external urinary catheter  Elevated BP, possibly related to pain, improving No oral antihypertensives seen in her list of home medications Initial blood pressure on presentation of 159/69 Possibly associated with pain Pain control for cellulitis IV hydralazine as needed with parameters Continue to monitor vital signs  Polyneuropathy Continue home gabapentin  Ambulatory dysfunction PT OT evaluated and recommended SNF Fall precautions TOC consulted to assist with SNF placement.  DVT prophylaxis: Subcu Lovenox daily  Code Status: Full code, patient does not have capacity to make decision on CODE STATUS at this time due to altered mental status.  Revisit CODE STATUS when altered mental status has resolved.  Family Communication: None at bedside.    Disposition Plan: Likely will dc to SNF  Consults called: None.  Admission status: Inpatient status.  Patient will require at least 2 midnights for further evaluation and treatment of present condition.   Status is: Inpatient    Dispo:             Patient From: Home             Planned Disposition: SNF             Expected discharge date: 01/14/20              Medically stable for discharge: No, ongoing management of acute metabolic encephalopathy, right hand cellulitis.      Status is: Inpatient    Dispo:  Patient From: Home  Planned Disposition: SNF  Expected discharge date: 01/14/20  Medically stable for discharge: No         Objective: Vitals:   01/12/20 0543 01/12/20 0700 01/12/20 0843 01/12/20 1141  BP: (!) 143/74 (!) 146/73 (!) 162/78 (!) 147/88  Pulse: (!) 56 61 60 63  Resp: 18 18 16 19   Temp: 98 F (36.7 C)  98.1 F (36.7 C) 97.6 F (36.4 C)  TempSrc:      SpO2: 99% 99% 99% 100%  Weight:      Height:        Intake/Output Summary (Last 24 hours) at 01/12/2020 1701 Last data filed at 01/12/2020 0900 Gross per 24 hour  Intake 826.4 ml  Output 400 ml  Net 426.4 ml   Filed Weights   01/11/20 2006 01/11/20 2047  Weight: 56.7 kg 51.3 kg    Exam:  . General: 86 y.o. year-old female well developed well nourished in no acute distress.  Alert and confused. . Cardiovascular: Regular rate and rhythm with no rubs or gallops.  No thyromegaly or JVD noted.   97 Respiratory: Clear to auscultation with no wheezes or rales. Good inspiratory effort. . Abdomen: Soft nontender nondistended with normal bowel sounds x4 quadrants. . Musculoskeletal: No lower extremity edema. 2/4 pulses in all 4 extremities. . Skin:R dorsal hand moderately edematous, with erythema, warmth, and tenderness. Marland Kitchen Psychiatry: Mood is appropriate for condition and setting   Data Reviewed: CBC: Recent Labs  Lab 01/11/20 1436 01/12/20 0504  WBC 9.9 7.3  NEUTROABS 8.4* 5.7  HGB 15.5* 13.8  HCT 47.1* 42.2  MCV 98.3 98.1  PLT 230 207   Basic Metabolic Panel: Recent Labs  Lab 01/11/20 1436 01/12/20 0504  NA 139 139  K 4.6 3.6  CL 105 108  CO2 26 23  GLUCOSE 100* 84  BUN 23 18  CREATININE 0.86 0.57  CALCIUM 9.1 8.5*   GFR: Estimated Creatinine Clearance: 42.4 mL/min (by C-G formula based on SCr of 0.57 mg/dL). Liver  Function Tests: Recent Labs  Lab 01/11/20 1436 01/12/20 0504  AST 24 26  ALT 21 18  ALKPHOS 63 51  BILITOT 1.2 1.2  PROT 7.8 6.1*  ALBUMIN 4.1 3.1*   No results for input(s): LIPASE, AMYLASE in the last 168 hours. Recent Labs  Lab 01/11/20 1437  AMMONIA 10   Coagulation Profile: Recent Labs  Lab 01/11/20 1436  INR 1.0   Cardiac Enzymes: No results for input(s): CKTOTAL, CKMB, CKMBINDEX, TROPONINI in the last 168 hours. BNP (last 3 results) No results for input(s): PROBNP in the last 8760 hours. HbA1C: No results for input(s): HGBA1C in the last 72 hours. CBG: No results for input(s): GLUCAP in the last 168 hours. Lipid Profile: No results for input(s): CHOL, HDL, LDLCALC, TRIG, CHOLHDL, LDLDIRECT in the last  72 hours. Thyroid Function Tests: No results for input(s): TSH, T4TOTAL, FREET4, T3FREE, THYROIDAB in the last 72 hours. Anemia Panel: No results for input(s): VITAMINB12, FOLATE, FERRITIN, TIBC, IRON, RETICCTPCT in the last 72 hours. Urine analysis:    Component Value Date/Time   COLORURINE YELLOW 01/11/2020 1942   APPEARANCEUR CLEAR 01/11/2020 1942   LABSPEC 1.025 01/11/2020 1942   PHURINE 5.0 01/11/2020 1942   GLUCOSEU NEGATIVE 01/11/2020 1942   HGBUR NEGATIVE 01/11/2020 1942   BILIRUBINUR NEGATIVE 01/11/2020 1942   BILIRUBINUR NEG 03/11/2015 1503   KETONESUR 20 (A) 01/11/2020 1942   PROTEINUR 30 (A) 01/11/2020 1942   UROBILINOGEN 0.2 03/11/2015 1503   NITRITE NEGATIVE 01/11/2020 1942   LEUKOCYTESUR NEGATIVE 01/11/2020 1942   Sepsis Labs: @LABRCNTIP (procalcitonin:4,lacticidven:4)  ) Recent Results (from the past 240 hour(s))  Blood Culture (routine x 2)     Status: None (Preliminary result)   Collection Time: 01/11/20  3:30 PM   Specimen: BLOOD  Result Value Ref Range Status   Specimen Description   Final    BLOOD LEFT ANTECUBITAL Performed at [facility] [place], [address], [place], [place]    Special Requests    Final    BOTTLES DRAWN AEROBIC AND ANAEROBIC Blood Culture results may not be optimal due to an excessive volume of blood received in culture bottles Performed at [facility], [address] [place], [place]    Culture   Final    NO GROWTH < 24 HOURS Performed at [facility], [address], [place], [place]    Report Status PENDING  Incomplete  Respiratory Panel by RT PCR (Flu A&B, Covid) - Nasopharyngeal Swab     Status: None   Collection Time: 01/11/20  3:37 PM   Specimen: Nasopharyngeal Swab  Result Value Ref Range Status   SARS Coronavirus 2 by RT PCR NEGATIVE NEGATIVE Final    Comment: (NOTE) SARS-CoV-2 target nucleic acids are NOT DETECTED.  The SARS-CoV-2 RNA is generally detectable in upper respiratoy specimens during the acute phase of infection. The lowest concentration of SARS-CoV-2 viral copies this assay can detect is 131 copies/mL. A negative result does not preclude SARS-Cov-2 infection and should not be used as the sole basis for treatment or other patient management decisions. A negative result may occur with  improper specimen collection/handling, submission of specimen other than nasopharyngeal swab, presence of viral mutation(s) within the areas targeted by this assay, and inadequate number of viral copies (<131 copies/mL). A negative result must be combined with clinical observations, patient history, and epidemiological information. The expected result is Negative.  Fact Sheet for Patients:  01/13/20  Fact Sheet for Healthcare Providers:  https://www.[place].com/  This test is no t yet approved or cleared by the https://www.young.biz/ FDA and  has been authorized for detection and/or diagnosis of SARS-CoV-2 by FDA under an Emergency Use Authorization (EUA). This EUA will remain  in effect (meaning this test can be used) for the duration of the COVID-19 declaration  under Section 564(b)(1) of the Act, 21 U.S.C. section 360bbb-3(b)(1), unless the authorization is terminated or revoked sooner.     Influenza A by PCR NEGATIVE NEGATIVE Final   Influenza B by PCR NEGATIVE NEGATIVE Final    Comment: (NOTE) The Xpert Xpress SARS-CoV-2/FLU/RSV assay is intended as an aid in  the diagnosis of influenza from Nasopharyngeal swab specimens and  should not be used as a sole basis for treatment. Nasal washings and  aspirates  are unacceptable for Xpert Xpress SARS-CoV-2/FLU/RSV  testing.  Fact Sheet for Patients: https://www.Maybee.com/https://www.fda.gov/media/142436/download  Fact Sheet for Healthcare Providers: https://www.young.biz/https://www.fda.gov/media/142435/download  This test is not yet approved or cleared by the [place] FDA and  has been authorized for detection and/or diagnosis of SARS-CoV-2 by  FDA under an Emergency Use Authorization (EUA). This EUA will remain  in effect (meaning this test can be used) for the duration of the  Covid-19 declaration under Section 564(b)(1) of the Act, 21  U.S.C. section 360bbb-3(b)(1), unless the authorization is  terminated or revoked. Performed at [facility], [address] [place], [place] [address]   Blood Culture (routine x 2)     Status: None (Preliminary result)   Collection Time: 01/11/20  3:43 PM   Specimen: BLOOD  Result Value Ref Range Status   Specimen Description   Final    BLOOD RIGHT ANTECUBITAL Performed at [facility], [address] [place], [place] [address]    Special Requests   Final    BOTTLES DRAWN AEROBIC AND ANAEROBIC Blood Culture adequate volume Performed at [facility], [address] [place], [place] [address]    Culture   Final    NO GROWTH < 24 HOURS Performed at [facility], [address] [place], [place] [address]    Report Status PENDING  Incomplete  MRSA PCR Screening     Status: None   Collection Time: 01/11/20  5:27 PM    Specimen: Nasopharyngeal  Result Value Ref Range Status   MRSA by PCR NEGATIVE NEGATIVE Final    Comment:        The GeneXpert MRSA Assay (FDA approved for NASAL specimens only), is one component of a comprehensive MRSA colonization surveillance program. It is not intended to diagnose MRSA infection nor to guide or monitor treatment for MRSA infections. Performed at [facility], [address] [place] [address] [place], [place] [address]       Studies: No results found.  Scheduled Meds: . aspirin EC  81 mg Oral Daily  . enoxaparin (LOVENOX) injection  40 mg Subcutaneous Q24H  . gabapentin  200 mg Oral BID  . multivitamin with minerals  1 tablet Oral Daily  . saccharomyces boulardii  250 mg Oral BID    Continuous Infusions: . sodium chloride 75 mL/hr at 01/11/20 2058  . ampicillin-sulbactam (UNASYN) IV 3 g (01/12/20 1127)     LOS: 1 day     Darlin Droparole N Cayleigh Paull, MD [facility] Pager [phone]  If 7PM-7AM, please contact night-coverage [email or web address] Password [email or web address] 01/12/2020, 5:01 PM

## 2020-01-13 DIAGNOSIS — R4182 Altered mental status, unspecified: Secondary | ICD-10-CM | POA: Diagnosis not present

## 2020-01-13 LAB — URINE CULTURE: Culture: NO GROWTH

## 2020-01-13 LAB — MAGNESIUM: Magnesium: 2.1 mg/dL (ref 1.7–2.4)

## 2020-01-13 MED ORDER — AMLODIPINE BESYLATE 5 MG PO TABS
5.0000 mg | ORAL_TABLET | Freq: Every day | ORAL | Status: DC
  Administered 2020-01-14 – 2020-01-20 (×7): 5 mg via ORAL
  Filled 2020-01-13 (×8): qty 1

## 2020-01-13 MED ORDER — AMLODIPINE BESYLATE 5 MG PO TABS
2.5000 mg | ORAL_TABLET | Freq: Every day | ORAL | Status: DC
  Filled 2020-01-13: qty 1

## 2020-01-13 NOTE — Progress Notes (Addendum)
PROGRESS NOTE  Amy PatteeMary P Wesche ZOX:[id number] DOB: 01/27/1934 DOA: 01/11/2020 PCP: Arnette FeltsMoore, Janece, FNP  HPI/Recap of past 24 hours:  Amy Whitehead is a 86 y.o. female with medical history significant for polyneuropathy on gabapentin who presented to [facility] ED due to confusion at home where she lives alone and independent of her ADLs. Patient arrived via EMS from home with altered mental status.  Patient's friend did not see her at church on the day of presentation so called EMS who found her at home confused.  She reports that her dog scratched her right hand a few days prior.  She had erythema, significant edema, warmth and tenderness in the right dorsum hand.  X-rays were negative for fracture or dislocation.  She is afebrile and has a normal white count.   Received Unasyn in the ED.  TRH, hospitalist team, was asked to admit.    ED Course:  CT head unremarkable for any acute intracranial findings.  Urine culture was negative.  Hospital course complicated by delirium.  Per her daughter she has had episodes of confusion off and on for the last 6 months.  No official diagnosis of dementia.  01/13/20: Seen and examined at bedside.  She is on soft restraints for her own safety due to overnight agitation, tried to get out of bed, pulled at telemetry leads.  Per bedside RN she has been refusing her oral medications.  She was started on Seroquel 12.5 mg twice daily on 01/12/2020.  Assessed by PT OT with recommendation for SNF.  TOC is assisting with SNF placement.  Patient will need to be off restraints for 24 hours prior to SNF search.  Assessment/Plan: Active Problems:   AMS (altered mental status)  Acute metabolic encephalopathy, persistent, suspect multifactorial, secondary to delirium and acute infective process, right hand cellulitis CT head no contrast done on 01/11/2020 no evidence of acute intracranial findings. Exam with no focal findings, motor and sensation were intact. UA no evidence of  bacteria or nitrite but UA wbc 6-10.  Ucx negative, no growth.  No hx of alcohol use per her daughter; Lynden Angtoh level negative  Continue current management. Seroquel 12.5 mg BID Currently on soft restraints for patient's own safety. Her daughter states that she has had periods of confusion on and off in the past 6 months; has not been assessed for dementia. Continue Delirium precautions Continue Fall precautions  Right hand cellulitis, likely from her dog scratch.  MRSA screening test negative. Received a dose of Unasyn in the ED and 1 dose of IV vancomycin. Continue IV unasyn 3 gm q6H Continue IV fluid Continue Pain control Follow blood cultures, negative to date Mark cellulitic area with skin marker for monitoring of resolution Still edematous with erythema, warmth and tenderness but improved. Currently afebrile with no leukocytosis  Acute urinary retention Monitor UO Has external urinary catheter  Elevated BP, possibly related to pain, improving No oral antihypertensives seen in her list of home medications Initial blood pressure on presentation of 159/69 Possibly associated with pain Pain control for cellulitis Started Norvasc 5 mg daily. Continue to monitor vital signs IV hydralazine as needed with parameters.  Polyneuropathy Continue home gabapentin  Ambulatory dysfunction PT OT evaluated and recommended SNF Fall precautions TOC consulted to assist with SNF placement. Patient will need to be off restraints physical and chemical at least 24 hours prior to SNF search.  DVT prophylaxis: Subcu Lovenox daily  Code Status: Full code.  Family Communication:  Updated her daughter at  bedside on 01/12/2020.   Disposition Plan:  SNF when acute metabolic encephalopathy has resolved.  Consults called: None.  Admission status: Inpatient status.  Patient will require at least 2 midnights for further evaluation and treatment of present condition.   Status is:  Inpatient    Dispo:             Patient From: Home             Planned Disposition: SNF             Expected discharge date: 01/15/20             Medically stable for discharge: No, ongoing management of acute metabolic encephalopathy, right hand cellulitis.    Objective: Vitals:   01/12/20 0843 01/12/20 1141 01/12/20 2053 01/13/20 0631  BP: (!) 162/78 (!) 147/88 (!) 166/63 (!) 156/97  Pulse: 60 63 74 66  Resp: 16 19 18 16   Temp: 98.1 F (36.7 C) 97.6 F (36.4 C) 97.8 F (36.6 C)   TempSrc:   Oral   SpO2: 99% 100% 95% 97%  Weight:      Height:        Intake/Output Summary (Last 24 hours) at 01/13/2020 1413 Last data filed at 01/13/2020 0520 Gross per 24 hour  Intake 1981.42 ml  Output 400 ml  Net 1581.42 ml   Filed Weights   01/11/20 2006 01/11/20 2047  Weight: 56.7 kg 51.3 kg    Exam:  . General: 86 y.o. year-old female well-developed well-nourished in no acute distress.  Somnolent, easily arousable, calm at the time of this visit but confused.   . Cardiovascular: Regular rate and rhythm no rubs or gallops.  Grade 2 out of 6 systolic murmur. 97 Respiratory: Clear to auscultation with no wheezes or rales.  Poor inspiratory effort.   . Abdomen: Soft nontender no bowel sounds present.  . Musculoskeletal: No lower extremity edema bilaterally.   . Skin: Right dorsal hand, edema, erythema are improved.   Marland Kitchen Psychiatry: Confused.   Data Reviewed: CBC: Recent Labs  Lab 01/11/20 1436 01/12/20 0504  WBC 9.9 7.3  NEUTROABS 8.4* 5.7  HGB 15.5* 13.8  HCT 47.1* 42.2  MCV 98.3 98.1  PLT 230 207   Basic Metabolic Panel: Recent Labs  Lab 01/11/20 1436 01/12/20 0504 01/13/20 0504  NA 139 139  --   K 4.6 3.6  --   CL 105 108  --   CO2 26 23  --   GLUCOSE 100* 84  --   BUN 23 18  --   CREATININE 0.86 0.57  --   CALCIUM 9.1 8.5*  --   MG  --   --  2.1   GFR: Estimated Creatinine Clearance: 42.4 mL/min (by C-G formula based on SCr of 0.57 mg/dL). Liver  Function Tests: Recent Labs  Lab 01/11/20 1436 01/12/20 0504  AST 24 26  ALT 21 18  ALKPHOS 63 51  BILITOT 1.2 1.2  PROT 7.8 6.1*  ALBUMIN 4.1 3.1*   No results for input(s): LIPASE, AMYLASE in the last 168 hours. Recent Labs  Lab 01/11/20 1437  AMMONIA 10   Coagulation Profile: Recent Labs  Lab 01/11/20 1436  INR 1.0   Cardiac Enzymes: No results for input(s): CKTOTAL, CKMB, CKMBINDEX, TROPONINI in the last 168 hours. BNP (last 3 results) No results for input(s): PROBNP in the last 8760 hours. HbA1C: No results for input(s): HGBA1C in the last 72 hours. CBG: No results for input(s): GLUCAP in the  last 168 hours. Lipid Profile: No results for input(s): CHOL, HDL, LDLCALC, TRIG, CHOLHDL, LDLDIRECT in the last 72 hours. Thyroid Function Tests: No results for input(s): TSH, T4TOTAL, FREET4, T3FREE, THYROIDAB in the last 72 hours. Anemia Panel: No results for input(s): VITAMINB12, FOLATE, FERRITIN, TIBC, IRON, RETICCTPCT in the last 72 hours. Urine analysis:    Component Value Date/Time   COLORURINE YELLOW 01/11/2020 1942   APPEARANCEUR CLEAR 01/11/2020 1942   LABSPEC 1.025 01/11/2020 1942   PHURINE 5.0 01/11/2020 1942   GLUCOSEU NEGATIVE 01/11/2020 1942   HGBUR NEGATIVE 01/11/2020 1942   BILIRUBINUR NEGATIVE 01/11/2020 1942   BILIRUBINUR NEG 03/11/2015 1503   KETONESUR 20 (A) 01/11/2020 1942   PROTEINUR 30 (A) 01/11/2020 1942   UROBILINOGEN 0.2 03/11/2015 1503   NITRITE NEGATIVE 01/11/2020 1942   LEUKOCYTESUR NEGATIVE 01/11/2020 1942   Sepsis Labs: @LABRCNTIP (procalcitonin:4,lacticidven:4)  ) Recent Results (from the past 240 hour(s))  Blood Culture (routine x 2)     Status: None (Preliminary result)   Collection Time: 01/11/20  3:30 PM   Specimen: BLOOD  Result Value Ref Range Status   Specimen Description   Final    BLOOD LEFT ANTECUBITAL Performed at [facility], [address], [place], [place]    Special Requests    Final    BOTTLES DRAWN AEROBIC AND ANAEROBIC Blood Culture results may not be optimal due to an excessive volume of blood received in culture bottles Performed at [facility], [address], [place], [place]    Culture   Final    NO GROWTH 2 DAYS Performed at [facility], [address], [place], [place]    Report Status PENDING  Incomplete  Respiratory Panel by RT PCR (Flu A&B, Covid) - Nasopharyngeal Swab     Status: None   Collection Time: 01/11/20  3:37 PM   Specimen: Nasopharyngeal Swab  Result Value Ref Range Status   SARS Coronavirus 2 by RT PCR NEGATIVE NEGATIVE Final    Comment: (NOTE) SARS-CoV-2 target nucleic acids are NOT DETECTED.  The SARS-CoV-2 RNA is generally detectable in upper respiratoy specimens during the acute phase of infection. The lowest concentration of SARS-CoV-2 viral copies this assay can detect is 131 copies/mL. A negative result does not preclude SARS-Cov-2 infection and should not be used as the sole basis for treatment or other patient management decisions. A negative result may occur with  improper specimen collection/handling, submission of specimen other than nasopharyngeal swab, presence of viral mutation(s) within the areas targeted by this assay, and inadequate number of viral copies (<131 copies/mL). A negative result must be combined with clinical observations, patient history, and epidemiological information. The expected result is Negative.  Fact Sheet for Patients:  01/13/20  Fact Sheet for Healthcare Providers:  https://www.Franchino.com/  This test is no t yet approved or cleared by the https://www.young.biz/ FDA and  has been authorized for detection and/or diagnosis of SARS-CoV-2 by FDA under an Emergency Use Authorization (EUA). This EUA will remain  in effect (meaning this test can be used) for the duration of the COVID-19 declaration under  Section 564(b)(1) of the Act, 21 U.S.C. section 360bbb-3(b)(1), unless the authorization is terminated or revoked sooner.     Influenza A by PCR NEGATIVE NEGATIVE Final   Influenza B by PCR NEGATIVE NEGATIVE Final    Comment: (NOTE) The Xpert Xpress SARS-CoV-2/FLU/RSV assay is intended as an aid in  the diagnosis of influenza from Nasopharyngeal swab specimens  and  should not be used as a sole basis for treatment. Nasal washings and  aspirates are unacceptable for Xpert Xpress SARS-CoV-2/FLU/RSV  testing.  Fact Sheet for Patients: https://www.Sachs.com/  Fact Sheet for Healthcare Providers: https://www.young.biz/  This test is not yet approved or cleared by the [place] FDA and  has been authorized for detection and/or diagnosis of SARS-CoV-2 by  FDA under an Emergency Use Authorization (EUA). This EUA will remain  in effect (meaning this test can be used) for the duration of the  Covid-19 declaration under Section 564(b)(1) of the Act, 21  U.S.C. section 360bbb-3(b)(1), unless the authorization is  terminated or revoked. Performed at [facility], [address], [place], [place] [address]   Blood Culture (routine x 2)     Status: None (Preliminary result)   Collection Time: 01/11/20  3:43 PM   Specimen: BLOOD  Result Value Ref Range Status   Specimen Description   Final    BLOOD RIGHT ANTECUBITAL Performed at [facility], [address] [place], [place] [address]    Special Requests   Final    BOTTLES DRAWN AEROBIC AND ANAEROBIC Blood Culture adequate volume Performed at [facility], [address] [place], [place] [address]    Culture   Final    NO GROWTH 2 DAYS Performed at [facility], [address], [place], [place] [address]    Report Status PENDING  Incomplete  MRSA PCR Screening     Status: None   Collection Time: 01/11/20  5:27 PM   Specimen:  Nasopharyngeal  Result Value Ref Range Status   MRSA by PCR NEGATIVE NEGATIVE Final    Comment:        The GeneXpert MRSA Assay (FDA approved for NASAL specimens only), is one component of a comprehensive MRSA colonization surveillance program. It is not intended to diagnose MRSA infection nor to guide or monitor treatment for MRSA infections. Performed at [facility], [address] [place], [place] [address]   Urine culture     Status: None   Collection Time: 01/11/20  7:42 PM   Specimen: Urine, Catheterized  Result Value Ref Range Status   Specimen Description   Final    URINE, CATHETERIZED Performed at [facility], [address], [place], [place] [address]    Special Requests   Final    NONE Performed at [facility], [address] [place], [place] [address]    Culture   Final    NO GROWTH Performed at [facility], [address] [place], [place] [address]    Report Status 01/13/2020 FINAL  Final      Studies: No results found.  Scheduled Meds: . amLODipine  5 mg Oral Daily  . aspirin EC  81 mg Oral Daily  . enoxaparin (LOVENOX) injection  40 mg Subcutaneous Q24H  . gabapentin  200 mg Oral BID  . multivitamin with minerals  1 tablet Oral Daily  . QUEtiapine  12.5 mg Oral BID  . saccharomyces boulardii  250 mg Oral BID    Continuous Infusions: . sodium chloride 75 mL/hr at 01/13/20 0703  . ampicillin-sulbactam (UNASYN) IV 3 g (01/13/20 1111)     LOS: 2 days     Darlin Drop, MD [facility] Pager [phone]  If 7PM-7AM, please contact night-coverage [email or web address] Password [email or web address] 01/13/2020, 2:13 PM

## 2020-01-13 NOTE — TOC Initial Note (Addendum)
Transition of Care [facility]) - Initial/Assessment Note    Patient Details  Name: Amy Whitehead MRN: [id number] Date of Birth: [birth year]  [facility] CM/SW Contact:    Lanier Clam, RN Phone Number: 01/13/2020, 10:53 AM  Clinical Narrative:  From home alone. Alert to self, currently confused.Wrist/belt restraints-receiving haldol,seroquel.PT recc SNF.Left vm w/dtr-[name]-await call back.                 Expected Discharge Plan: Skilled Nursing Facility Barriers to Discharge: Continued Medical Work up   Patient Goals and CMS Choice Patient states their goals for this hospitalization and ongoing recovery are:: go to rehab CMS Medicare.gov Compare Post Acute Care list provided to:: Patient Represenative (must comment) (dtr [name])    Expected Discharge Plan and Services Expected Discharge Plan: Skilled Nursing Facility   Discharge Planning Services: CM Consult                                          Prior Living Arrangements/Services   Lives with:: Self Patient language and need for interpreter reviewed:: Yes Do you feel safe going back to the place where you live?: Yes      Need for Family Participation in Patient Care: No (Comment) Care giver support system in place?: Yes (comment)   Criminal Activity/Legal Involvement Pertinent to Current Situation/Hospitalization: No - Comment as needed  Activities of Daily Living Home Assistive Devices/Equipment: None ADL Screening (condition at time of admission) Patient's cognitive ability adequate to safely complete daily activities?: No Is the patient deaf or have difficulty hearing?: Yes Does the patient have difficulty seeing, even when wearing glasses/contacts?: No Does the patient have difficulty concentrating, remembering, or making decisions?: No Patient able to express need for assistance with ADLs?: No Does the patient have difficulty dressing or bathing?: No Independently performs ADLs?:  No Grooming: Independent Feeding: Independent Bathing: Needs assistance Toileting: Needs assistance In/Out Bed: Needs assistance Walks in Home: Needs assistance Does the patient have difficulty walking or climbing stairs?: No Weakness of Legs: None Weakness of Arms/Hands: None  Permission Sought/Granted Permission sought to share information with : Case Manager Permission granted to share information with : Yes, Verbal Permission Granted  Share Information with NAME: Case Manager     Permission granted to share info w Relationship: [name] dtr [phone]     Emotional Assessment Appearance:: Appears stated age Attitude/Demeanor/Rapport: Gracious Affect (typically observed): Accepting Orientation: : Oriented to Self Alcohol / Substance Use: Not Applicable Psych Involvement: No (comment)  Admission diagnosis:  Confusion [R41.0] Cellulitis of right upper extremity [L03.113] AMS (altered mental status) [R41.82] Patient Active Problem List   Diagnosis Date Noted  . AMS (altered mental status) 01/11/2020  . Nocturia 03/12/2015  . Varicose veins 07/18/2014  . Plantar fasciitis of left foot 04/17/2011  . Post herpetic neuralgia 04/17/2011  . Health care maintenance 04/17/2011   PCP:  Arnette Felts, FNP Pharmacy:   [facility] [address] [place]), [place] - [address] [place] [address] Phone: [phone] Fax: [phone]     Social Determinants of Health (SDOH) Interventions    Readmission Risk Interventions No flowsheet data found.

## 2020-01-13 NOTE — Progress Notes (Signed)
Patient is refusing all PO meds this am-offered 3x & educated on importance of her meds. Refused water & food as well. Willl continue to offer. Paged MD Margo Aye to update restraint order. Pt refusing tele at this time. Purewick in place. IV abx continued. Denies pain.  Unfortunately this am when asked questions she responds with "get out of my room" *yelling*  Will continue to monitor.

## 2020-01-14 ENCOUNTER — Encounter (HOSPITAL_COMMUNITY): Payer: Self-pay | Admitting: Internal Medicine

## 2020-01-14 DIAGNOSIS — R4182 Altered mental status, unspecified: Secondary | ICD-10-CM | POA: Diagnosis not present

## 2020-01-14 DIAGNOSIS — L03113 Cellulitis of right upper limb: Secondary | ICD-10-CM | POA: Diagnosis not present

## 2020-01-14 LAB — BASIC METABOLIC PANEL
Anion gap: 7 (ref 5–15)
BUN: 13 mg/dL (ref 8–23)
CO2: 23 mmol/L (ref 22–32)
Calcium: 8.6 mg/dL — ABNORMAL LOW (ref 8.9–10.3)
Chloride: 108 mmol/L (ref 98–111)
Creatinine, Ser: 0.71 mg/dL (ref 0.44–1.00)
GFR, Estimated: >60 mL/min (ref >60)
Glucose, Bld: 66 mg/dL — ABNORMAL LOW (ref 70–99)
Potassium: 4 mmol/L (ref 3.5–5.1)
Sodium: 138 mmol/L (ref 135–145)

## 2020-01-14 LAB — CBC
HCT: 44 % (ref 36.0–46.0)
Hemoglobin: 14.5 g/dL (ref 12.0–15.0)
MCH: 32.2 pg (ref 26.0–34.0)
MCHC: 33 g/dL (ref 30.0–36.0)
MCV: 97.8 fL (ref 80.0–100.0)
Platelets: 215 10*3/uL (ref 150–400)
RBC: 4.5 MIL/uL (ref 3.87–5.11)
RDW: 13.6 % (ref 11.5–15.5)
WBC: 5.6 10*3/uL (ref 4.0–10.5)
nRBC: 0 % (ref 0.0–0.2)

## 2020-01-14 MED ORDER — AMOXICILLIN-POT CLAVULANATE 875-125 MG PO TABS
1.0000 | ORAL_TABLET | Freq: Two times a day (BID) | ORAL | Status: AC
  Administered 2020-01-14 – 2020-01-17 (×7): 1 via ORAL
  Filled 2020-01-14 (×7): qty 1

## 2020-01-14 NOTE — Progress Notes (Signed)
PROGRESS NOTE    Amy Whitehead  UXN:[id number] DOB: [birth year] DOA: 01/11/2020 PCP: Arnette Felts, FNP    Brief Narrative:  86 y.o.femalewith medical history significant forpolyneuropathy on gabapentin who presented to [facility] ED due to confusion at home where she lives alone and independent of her ADLs. Patient arrived via EMS from home with altered mental status. Patient'sfriend did not see her at church on the day of presentation so called EMS who found her at home confused. She reports that her dog scratched her right hand a few days prior. She had erythema, significant edema, warmth and tenderness in the right dorsum hand.  X-rays were negative for fracture or dislocation.  She is afebrile and has a normal white count.Received Unasyn in the ED. [facility], hospitalist team, was asked to admit.   ED Course: CT head unremarkable for any acute intracranial findings.  Urine culture was negative.  Hospital course complicated by delirium.  Per her daughter she has had episodes of confusion off and on for the last 6 months.  No official diagnosis of dementia  Assessment & Plan:   Active Problems:   AMS (altered mental status)   Acute metabolic encephalopathy, persistent, suspect multifactorial, secondary to delirium and acute infective process, right hand cellulitis CT head no contrast done on 01/11/2020 no evidence of acute intracranial findings. Exam with no focal findings, motor and sensation were intact. UA no evidence of bacteria or nitrite but UA wbc 6-10.  Ucx negative, no growth.  Etoh level negative  On seroquel 12.5 mg BID This AM, alert and oriented x 3 (person, place, time) and following commands Restraint order has been d/c'd today -Cont fall precautions  Right hand cellulitis, likely from her dog scratch.  MRSA screening test negative. Received a dose of Unasyn in the ED and 1 dose of IV vancomycin. Was continued on IV unasyn 3 gm q6H. Transitioned to PO augmentin  today Continue Pain control Follow blood cultures, negative to date Still edematous with erythema, warmth and tenderness but seems to be improving Currently afebrile with no leukocytosis  Acute urinary retention Continue to monitor UO Has external urinary catheter  Elevated BP, possibly relatedtopain, improving No oral antihypertensivesseenin her list of home medications Initial blood pressure on presentation of 159/69, still suboptimally controlled at this time Possibly associated with pain Pain control for cellulitis Cont on Norvasc 5 mg daily. Continue to monitor vital signs IV hydralazine as needed with parameters.  Polyneuropathy Continue home gabapentin  Ambulatory dysfunction PT OT evaluated and recommended SNF Fall precautions TOC consulted to assist with SNF placement. Now off restraints as of 11/17   DVT prophylaxis: Lovenox subq Code Status: Full Family Communication: Pt in room, family not at bedside  Status is: Inpatient  Remains inpatient appropriate because:Unsafe d/c plan   Dispo:  Patient From: Home  Planned Disposition: Skilled Nursing Facility  Expected discharge date: 01/16/20  Medically stable for discharge: No        Consultants:     Procedures:     Antimicrobials: Anti-infectives (From admission, onward)   Start     Dose/Rate Route Frequency Ordered Stop   01/14/20 1800  amoxicillin-clavulanate (AUGMENTIN) 875-125 MG per tablet 1 tablet        1 tablet Oral Every 12 hours 01/14/20 1331     01/12/20 2000  vancomycin (VANCOREADY) IVPB 750 mg/150 mL  Status:  Discontinued        750 mg 150 mL/hr over 60 Minutes Intravenous Every 24 hours 01/11/20 1940  01/12/20 0545   01/11/20 2200  Ampicillin-Sulbactam (UNASYN) 3 g in sodium chloride 0.9 % 100 mL IVPB  Status:  Discontinued        3 g 200 mL/hr over 30 Minutes Intravenous Every 6 hours 01/11/20 1901 01/14/20 1331   01/11/20 2000  vancomycin (VANCOCIN) IVPB 1000 mg/200 mL  premix        1,000 mg 200 mL/hr over 60 Minutes Intravenous  Once 01/11/20 1940 01/11/20 2233   01/11/20 1515  Ampicillin-Sulbactam (UNASYN) 3 g in sodium chloride 0.9 % 100 mL IVPB        3 g 200 mL/hr over 30 Minutes Intravenous  Once 01/11/20 1457 01/11/20 1630   01/11/20 1445  vancomycin (VANCOCIN) IVPB 1000 mg/200 mL premix  Status:  Discontinued        1,000 mg 200 mL/hr over 60 Minutes Intravenous  Once 01/11/20 1438 01/11/20 1448   01/11/20 1445  cefTRIAXone (ROCEPHIN) 2 g in sodium chloride 0.9 % 100 mL IVPB  Status:  Discontinued        2 g 200 mL/hr over 30 Minutes Intravenous  Once 01/11/20 1438 01/11/20 1454       Subjective: Still with hand discomfort  Objective: Vitals:   01/13/20 0631 01/13/20 2235 01/14/20 1228 01/14/20 1300  BP: (!) 156/97 (!) 163/58 (!) 162/65 (!) 163/69  Pulse: 66 (!) 57 (!) 56 (!) 59  Resp: 16 16 16 16   Temp:  98.1 F (36.7 C) 97.9 F (36.6 C) 98 F (36.7 C)  TempSrc:  Oral Oral Oral  SpO2: 97% 97% 100% 100%  Weight:      Height:        Intake/Output Summary (Last 24 hours) at 01/14/2020 1534 Last data filed at 01/14/2020 1500 Gross per 24 hour  Intake 1224.82 ml  Output 400 ml  Net 824.82 ml   Filed Weights   01/11/20 2006 01/11/20 2047  Weight: 56.7 kg 51.3 kg    Examination:  General exam: Appears calm and comfortable  Respiratory system: Clear to auscultation. Respiratory effort normal. Cardiovascular system: S1 & S2 heard, Regular Gastrointestinal system: Abdomen is nondistended, soft and nontender. No organomegaly or masses felt. Normal bowel sounds heard. Central nervous system: Alert and oriented. No focal neurological deficits. Extremities: Symmetric 5 x 5 power, R hand swollen, tender Skin: No rashes, lesions Psychiatry: Judgement and insight appear normal. Mood & affect appropriate.   Data Reviewed: I have personally reviewed following labs and imaging studies  CBC: Recent Labs  Lab 01/11/20 1436  01/12/20 0504 01/14/20 0558  WBC 9.9 7.3 5.6  NEUTROABS 8.4* 5.7  --   HGB 15.5* 13.8 14.5  HCT 47.1* 42.2 44.0  MCV 98.3 98.1 97.8  PLT 230 207 215   Basic Metabolic Panel: Recent Labs  Lab 01/11/20 1436 01/12/20 0504 01/13/20 0504 01/14/20 0558  NA 139 139  --  138  K 4.6 3.6  --  4.0  CL 105 108  --  108  CO2 26 23  --  23  GLUCOSE 100* 84  --  66*  BUN 23 18  --  13  CREATININE 0.86 0.57  --  0.71  CALCIUM 9.1 8.5*  --  8.6*  MG  --   --  2.1  --    GFR: Estimated Creatinine Clearance: 42.4 mL/min (by C-G formula based on SCr of 0.71 mg/dL). Liver Function Tests: Recent Labs  Lab 01/11/20 1436 01/12/20 0504  AST 24 26  ALT 21 18  ALKPHOS  63 51  BILITOT 1.2 1.2  PROT 7.8 6.1*  ALBUMIN 4.1 3.1*   No results for input(s): LIPASE, AMYLASE in the last 168 hours. Recent Labs  Lab 01/11/20 1437  AMMONIA 10   Coagulation Profile: Recent Labs  Lab 01/11/20 1436  INR 1.0   Cardiac Enzymes: No results for input(s): CKTOTAL, CKMB, CKMBINDEX, TROPONINI in the last 168 hours. BNP (last 3 results) No results for input(s): PROBNP in the last 8760 hours. HbA1C: No results for input(s): HGBA1C in the last 72 hours. CBG: No results for input(s): GLUCAP in the last 168 hours. Lipid Profile: No results for input(s): CHOL, HDL, LDLCALC, TRIG, CHOLHDL, LDLDIRECT in the last 72 hours. Thyroid Function Tests: No results for input(s): TSH, T4TOTAL, FREET4, T3FREE, THYROIDAB in the last 72 hours. Anemia Panel: No results for input(s): VITAMINB12, FOLATE, FERRITIN, TIBC, IRON, RETICCTPCT in the last 72 hours. Sepsis Labs: Recent Labs  Lab 01/11/20 1436  LATICACIDVEN 1.7    Recent Results (from the past 240 hour(s))  Blood Culture (routine x 2)     Status: None (Preliminary result)   Collection Time: 01/11/20  3:30 PM   Specimen: BLOOD  Result Value Ref Range Status   Specimen Description   Final    BLOOD LEFT ANTECUBITAL Performed at [facility], [address], [place], [place] [address]    Special Requests   Final    BOTTLES DRAWN AEROBIC AND ANAEROBIC Blood Culture results may not be optimal due to an excessive volume of blood received in culture bottles Performed at [facility], [address], [place], [place] [address]    Culture   Final    NO GROWTH 3 DAYS Performed at [facility], [address], [place], [place] [address]    Report Status PENDING  Incomplete  Respiratory Panel by RT PCR (Flu A&B, Covid) - Nasopharyngeal Swab     Status: None   Collection Time: 01/11/20  3:37 PM   Specimen: Nasopharyngeal Swab  Result Value Ref Range Status   SARS Coronavirus 2 by RT PCR NEGATIVE NEGATIVE Final    Comment: (NOTE) SARS-CoV-2 target nucleic acids are NOT DETECTED.  The SARS-CoV-2 RNA is generally detectable in upper respiratoy specimens during the acute phase of infection. The lowest concentration of SARS-CoV-2 viral copies this assay can detect is 131 copies/mL. A negative result does not preclude SARS-Cov-2 infection and should not be used as the sole basis for treatment or other patient management decisions. A negative result may occur with  improper specimen collection/handling, submission of specimen other than nasopharyngeal swab, presence of viral mutation(s) within the areas targeted by this assay, and inadequate number of viral copies (<131 copies/mL). A negative result must be combined with clinical observations, patient history, and epidemiological information. The expected result is Negative.  Fact Sheet for Patients:  https://www.Demaree.com/  Fact Sheet for Healthcare Providers:  https://www.young.biz/  This test is no t yet approved or cleared by the [place] FDA and  has been authorized for detection and/or diagnosis of SARS-CoV-2 by FDA under an Emergency Use Authorization (EUA). This EUA will remain  in effect  (meaning this test can be used) for the duration of the COVID-19 declaration under Section 564(b)(1) of the Act, 21 U.S.C. section 360bbb-3(b)(1), unless the authorization is terminated or revoked sooner.     Influenza A by PCR NEGATIVE NEGATIVE Final   Influenza B by PCR NEGATIVE NEGATIVE Final    Comment: (NOTE) The Xpert  Xpress SARS-CoV-2/FLU/RSV assay is intended as an aid in  the diagnosis of influenza from Nasopharyngeal swab specimens and  should not be used as a sole basis for treatment. Nasal washings and  aspirates are unacceptable for Xpert Xpress SARS-CoV-2/FLU/RSV  testing.  Fact Sheet for Patients: https://www.Jeancharles.com/  Fact Sheet for Healthcare Providers: https://www.young.biz/  This test is not yet approved or cleared by the [place] FDA and  has been authorized for detection and/or diagnosis of SARS-CoV-2 by  FDA under an Emergency Use Authorization (EUA). This EUA will remain  in effect (meaning this test can be used) for the duration of the  Covid-19 declaration under Section 564(b)(1) of the Act, 21  U.S.C. section 360bbb-3(b)(1), unless the authorization is  terminated or revoked. Performed at [facility], [address], [place], [place] [address]   Blood Culture (routine x 2)     Status: None (Preliminary result)   Collection Time: 01/11/20  3:43 PM   Specimen: BLOOD  Result Value Ref Range Status   Specimen Description   Final    BLOOD RIGHT ANTECUBITAL Performed at [facility], [address] [place], [place] [address]    Special Requests   Final    BOTTLES DRAWN AEROBIC AND ANAEROBIC Blood Culture adequate volume Performed at [facility], [address], [place], [place] [address]    Culture   Final    NO GROWTH 3 DAYS Performed at [facility], [address] [place], [place] [address]    Report Status PENDING  Incomplete  MRSA PCR  Screening     Status: None   Collection Time: 01/11/20  5:27 PM   Specimen: Nasopharyngeal  Result Value Ref Range Status   MRSA by PCR NEGATIVE NEGATIVE Final    Comment:        The GeneXpert MRSA Assay (FDA approved for NASAL specimens only), is one component of a comprehensive MRSA colonization surveillance program. It is not intended to diagnose MRSA infection nor to guide or monitor treatment for MRSA infections. Performed at [facility], [address] [place], [place] [address]   Urine culture     Status: None   Collection Time: 01/11/20  7:42 PM   Specimen: Urine, Catheterized  Result Value Ref Range Status   Specimen Description   Final    URINE, CATHETERIZED Performed at [facility], [address] [place], [place] [address]    Special Requests   Final    NONE Performed at [facility], [address], [place], [place] [address]    Culture   Final    NO GROWTH Performed at [facility], [address], [place], [place] [address]    Report Status 01/13/2020 FINAL  Final     Radiology Studies: No results found.  Scheduled Meds: . amLODipine  5 mg Oral Daily  . amoxicillin-clavulanate  1 tablet Oral Q12H  . aspirin EC  81 mg Oral Daily  . enoxaparin (LOVENOX) injection  40 mg Subcutaneous Q24H  . gabapentin  200 mg Oral BID  . multivitamin with minerals  1 tablet Oral Daily  . QUEtiapine  12.5 mg Oral BID  . saccharomyces boulardii  250 mg Oral BID   Continuous Infusions:   LOS: 3 days   Rickey Barbara, MD [facility] Pager On Amion  If 7PM-7AM, please contact night-coverage 01/14/2020, 3:34 PM

## 2020-01-14 NOTE — Progress Notes (Signed)
PT Cancellation Note  Patient Details Name: MAITE BURLISON MRN: [id number] DOB: [birth year]   Cancelled Treatment:    Reason Eval/Treat Not Completed: Patient declined, no reason specified    Faye Ramsay, PT Acute Rehabilitation  Office: [phone] Pager: [phone]

## 2020-01-15 DIAGNOSIS — L03113 Cellulitis of right upper limb: Secondary | ICD-10-CM | POA: Diagnosis not present

## 2020-01-15 LAB — CBC
HCT: 47.2 % — ABNORMAL HIGH (ref 36.0–46.0)
Hemoglobin: 15.5 g/dL — ABNORMAL HIGH (ref 12.0–15.0)
MCH: 32.4 pg (ref 26.0–34.0)
MCHC: 32.8 g/dL (ref 30.0–36.0)
MCV: 98.7 fL (ref 80.0–100.0)
Platelets: 267 10*3/uL (ref 150–400)
RBC: 4.78 MIL/uL (ref 3.87–5.11)
RDW: 13.5 % (ref 11.5–15.5)
WBC: 6 10*3/uL (ref 4.0–10.5)
nRBC: 0 % (ref 0.0–0.2)

## 2020-01-15 LAB — COMPREHENSIVE METABOLIC PANEL
ALT: 23 U/L (ref 0–44)
AST: 31 U/L (ref 15–41)
Albumin: 3.5 g/dL (ref 3.5–5.0)
Alkaline Phosphatase: 62 U/L (ref 38–126)
Anion gap: 12 (ref 5–15)
BUN: 13 mg/dL (ref 8–23)
CO2: 24 mmol/L (ref 22–32)
Calcium: 8.7 mg/dL — ABNORMAL LOW (ref 8.9–10.3)
Chloride: 103 mmol/L (ref 98–111)
Creatinine, Ser: 0.65 mg/dL (ref 0.44–1.00)
GFR, Estimated: >60 mL/min (ref >60)
Glucose, Bld: 89 mg/dL (ref 70–99)
Potassium: 3.6 mmol/L (ref 3.5–5.1)
Sodium: 139 mmol/L (ref 135–145)
Total Bilirubin: 0.6 mg/dL (ref 0.3–1.2)
Total Protein: 6.9 g/dL (ref 6.5–8.1)

## 2020-01-15 NOTE — Care Management Important Message (Signed)
Important Message  Patient Details IM Letter given to the Patient. Name: KANITRA PURIFOY MRN: [id number] Date of Birth: Aug 14, 1933   Medicare Important Message Given:  Yes     Caren Macadam 01/15/2020, 11:28 AM

## 2020-01-15 NOTE — Progress Notes (Signed)
PROGRESS NOTE    Amy Whitehead  IDP:[id number] DOB: [birth year] DOA: 01/11/2020 PCP: Arnette Felts, FNP    Brief Narrative:  86 y.o.femalewith medical history significant forpolyneuropathy on gabapentin who presented to [facility] ED due to confusion at home where she lives alone and independent of her ADLs. Patient arrived via EMS from home with altered mental status. Patient'sfriend did not see her at church on the day of presentation so called EMS who found her at home confused. She reports that her dog scratched her right hand a few days prior. She had erythema, significant edema, warmth and tenderness in the right dorsum hand.  X-rays were negative for fracture or dislocation.  She is afebrile and has a normal white count.Received Unasyn in the ED. [facility], hospitalist team, was asked to admit.   ED Course: CT head unremarkable for any acute intracranial findings.  Urine culture was negative.  Hospital course complicated by delirium.  Per her daughter she has had episodes of confusion off and on for the last 6 months.  No official diagnosis of dementia  Assessment & Plan:   Active Problems:   AMS (altered mental status)   Acute metabolic encephalopathy secondary to acute infective process, right hand cellulitis CT head no contrast done on 01/11/2020 no evidence of acute intracranial findings. Exam with no focal findings, motor and sensation were intact. UA no evidence of bacteria or nitrite but UA wbc 6-10.  Ucx negative, no growth.  Etoh level negative  On seroquel 12.5 mg BID -Cont fall precautions  Right hand cellulitis, likely from her dog scratch.  MRSA screening test negative. Received a dose of Unasyn in the ED and 1 dose of IV vancomycin. Was continued on IV unasyn 3 gm q6H. Transitioned to PO augmentin today Continue with analgesic as needed Follow blood cultures, negative to date Still edematous with erythema, warmth and tenderness but seems to be  improving Currently afebrile with no leukocytosis  Acute urinary retention Continue to monitor UO Has external urinary catheter  Elevated BP, possibly relatedtopain, improving No oral antihypertensivesseenin her list of home medications Initial blood pressure on presentation of 159/69, still suboptimally controlled at this time Possibly associated with pain Pain control for cellulitis Cont on Norvasc 5 mg daily. Continue to monitor vital signs IV hydralazine as needed with parameters.  Polyneuropathy Continue home gabapentin  Ambulatory dysfunction PT OT evaluated and recommended SNF Fall precautions TOC consulted to assist with SNF placement. Remains off restraints as of 11/17  Possible Dementia Reported possible hx of prior dementia prior to admit Mentation now much improved with above abx Will benefit from formal outpt work up for dementia  DVT prophylaxis: Lovenox subq Code Status: Full Family Communication: Pt in room, family not at bedside  Status is: Inpatient  Remains inpatient appropriate because:Unsafe d/c plan  Dispo:  Patient From: Home  Planned Disposition: Skilled Nursing Facility  Expected discharge date: 01/16/20  Medically stable for discharge: No        Consultants:     Procedures:     Antimicrobials: Anti-infectives (From admission, onward)   Start     Dose/Rate Route Frequency Ordered Stop   01/14/20 1800  amoxicillin-clavulanate (AUGMENTIN) 875-125 MG per tablet 1 tablet        1 tablet Oral Every 12 hours 01/14/20 1331     01/12/20 2000  vancomycin (VANCOREADY) IVPB 750 mg/150 mL  Status:  Discontinued        750 mg 150 mL/hr over 60 Minutes Intravenous Every  24 hours 01/11/20 1940 01/12/20 0545   01/11/20 2200  Ampicillin-Sulbactam (UNASYN) 3 g in sodium chloride 0.9 % 100 mL IVPB  Status:  Discontinued        3 g 200 mL/hr over 30 Minutes Intravenous Every 6 hours 01/11/20 1901 01/14/20 1331   01/11/20 2000   vancomycin (VANCOCIN) IVPB 1000 mg/200 mL premix        1,000 mg 200 mL/hr over 60 Minutes Intravenous  Once 01/11/20 1940 01/11/20 2233   01/11/20 1515  Ampicillin-Sulbactam (UNASYN) 3 g in sodium chloride 0.9 % 100 mL IVPB        3 g 200 mL/hr over 30 Minutes Intravenous  Once 01/11/20 1457 01/11/20 1630   01/11/20 1445  vancomycin (VANCOCIN) IVPB 1000 mg/200 mL premix  Status:  Discontinued        1,000 mg 200 mL/hr over 60 Minutes Intravenous  Once 01/11/20 1438 01/11/20 1448   01/11/20 1445  cefTRIAXone (ROCEPHIN) 2 g in sodium chloride 0.9 % 100 mL IVPB  Status:  Discontinued        2 g 200 mL/hr over 30 Minutes Intravenous  Once 01/11/20 1438 01/11/20 1454      Subjective: Reports feeling better. States hand swelling has improved  Objective: Vitals:   01/14/20 2120 01/15/20 0631 01/15/20 0900 01/15/20 1000  BP: (!) 150/64 (!) 155/82 120/63 140/63  Pulse: 61 (!) 54 72 67  Resp: 18 16 16    Temp: 98 F (36.7 C) (!) 97.5 F (36.4 C) 97.6 F (36.4 C)   TempSrc: Oral Oral Oral   SpO2: 98% (!) 74% (!) 77%   Weight:      Height:        Intake/Output Summary (Last 24 hours) at 01/15/2020 1152 Last data filed at 01/15/2020 0530 Gross per 24 hour  Intake 600 ml  Output 800 ml  Net -200 ml   Filed Weights   01/11/20 2006 01/11/20 2047  Weight: 56.7 kg 51.3 kg    Examination: General exam: Awake, laying in bed, in nad Respiratory system: Normal respiratory effort, no wheezing Cardiovascular system: regular rate, s1, s2 Gastrointestinal system: Soft, nondistended, positive BS Central nervous system: CN2-12 grossly intact, strength intact Extremities: Perfused, no clubbing, R hand swelling improved Skin: Normal skin turgor, no notable skin lesions seen Psychiatry: Mood normal // no visual hallucinations    Data Reviewed: I have personally reviewed following labs and imaging studies  CBC: Recent Labs  Lab 01/11/20 1436 01/12/20 0504 01/14/20 0558 01/15/20 0523   WBC 9.9 7.3 5.6 6.0  NEUTROABS 8.4* 5.7  --   --   HGB 15.5* 13.8 14.5 15.5*  HCT 47.1* 42.2 44.0 47.2*  MCV 98.3 98.1 97.8 98.7  PLT 230 207 215 267   Basic Metabolic Panel: Recent Labs  Lab 01/11/20 1436 01/12/20 0504 01/13/20 0504 01/14/20 0558 01/15/20 0523  NA 139 139  --  138 139  K 4.6 3.6  --  4.0 3.6  CL 105 108  --  108 103  CO2 26 23  --  23 24  GLUCOSE 100* 84  --  66* 89  BUN 23 18  --  13 13  CREATININE 0.86 0.57  --  0.71 0.65  CALCIUM 9.1 8.5*  --  8.6* 8.7*  MG  --   --  2.1  --   --    GFR: Estimated Creatinine Clearance: 42.4 mL/min (by C-G formula based on SCr of 0.65 mg/dL). Liver Function Tests: Recent Labs  Lab 01/11/20 1436 01/12/20 0504 01/15/20 0523  AST ALT ALKPHOS 63 51 62  BILITOT 1.2 1.2 0.6  PROT 7.8 6.1* 6.9  ALBUMIN 4.1 3.1* 3.5   No results for input(s): LIPASE, AMYLASE in the last 168 hours. Recent Labs  Lab 01/11/20 1437  AMMONIA 10   Coagulation Profile: Recent Labs  Lab 01/11/20 1436  INR 1.0   Cardiac Enzymes: No results for input(s): CKTOTAL, CKMB, CKMBINDEX, TROPONINI in the last 168 hours. BNP (last 3 results) No results for input(s): PROBNP in the last 8760 hours. HbA1C: No results for input(s): HGBA1C in the last 72 hours. CBG: No results for input(s): GLUCAP in the last 168 hours. Lipid Profile: No results for input(s): CHOL, HDL, LDLCALC, TRIG, CHOLHDL, LDLDIRECT in the last 72 hours. Thyroid Function Tests: No results for input(s): TSH, T4TOTAL, FREET4, T3FREE, THYROIDAB in the last 72 hours. Anemia Panel: No results for input(s): VITAMINB12, FOLATE, FERRITIN, TIBC, IRON, RETICCTPCT in the last 72 hours. Sepsis Labs: Recent Labs  Lab 01/11/20 1436  LATICACIDVEN 1.7    Recent Results (from the past 240 hour(s))  Blood Culture (routine x 2)     Status: None (Preliminary result)   Collection Time: 01/11/20  3:30 PM   Specimen: BLOOD  Result Value Ref Range Status   Specimen  Description   Final    BLOOD LEFT ANTECUBITAL Performed at [facility], [address] [place], [place] [address]    Special Requests   Final    BOTTLES DRAWN AEROBIC AND ANAEROBIC Blood Culture results may not be optimal due to an excessive volume of blood received in culture bottles Performed at [facility], [address] [place], [place] [address]    Culture   Final    NO GROWTH 3 DAYS Performed at [facility], [address] [place], [place] [address]    Report Status PENDING  Incomplete  Respiratory Panel by RT PCR (Flu A&B, Covid) - Nasopharyngeal Swab     Status: None   Collection Time: 01/11/20  3:37 PM   Specimen: Nasopharyngeal Swab  Result Value Ref Range Status   SARS Coronavirus 2 by RT PCR NEGATIVE NEGATIVE Final    Comment: (NOTE) SARS-CoV-2 target nucleic acids are NOT DETECTED.  The SARS-CoV-2 RNA is generally detectable in upper respiratoy specimens during the acute phase of infection. The lowest concentration of SARS-CoV-2 viral copies this assay can detect is 131 copies/mL. A negative result does not preclude SARS-Cov-2 infection and should not be used as the sole basis for treatment or other patient management decisions. A negative result may occur with  improper specimen collection/handling, submission of specimen other than nasopharyngeal swab, presence of viral mutation(s) within the areas targeted by this assay, and inadequate number of viral copies (<131 copies/mL). A negative result must be combined with clinical observations, patient history, and epidemiological information. The expected result is Negative.  Fact Sheet for Patients:  https://www.[employer].com/  Fact Sheet for Healthcare Providers:  https://www.young.biz/  This test is no t yet approved or cleared by the [place] FDA and  has been authorized for detection and/or diagnosis of SARS-CoV-2 by FDA  under an Emergency Use Authorization (EUA). This EUA will remain  in effect (meaning this test can be used) for the duration of the COVID-19 declaration under Section 564(b)(1) of the Act, 21 U.S.C. section 360bbb-3(b)(1), unless the authorization is terminated or revoked sooner.  Influenza A by PCR NEGATIVE NEGATIVE Final   Influenza B by PCR NEGATIVE NEGATIVE Final    Comment: (NOTE) The Xpert Xpress SARS-CoV-2/FLU/RSV assay is intended as an aid in  the diagnosis of influenza from Nasopharyngeal swab specimens and  should not be used as a sole basis for treatment. Nasal washings and  aspirates are unacceptable for Xpert Xpress SARS-CoV-2/FLU/RSV  testing.  Fact Sheet for Patients: https://www.[place].com/https://www.fda.gov/media/142436/download  Fact Sheet for Healthcare Providers: https://www.young.biz/https://www.fda.gov/media/142435/download  This test is not yet approved or cleared by the [place] FDA and  has been authorized for detection and/or diagnosis of SARS-CoV-2 by  FDA under an Emergency Use Authorization (EUA). This EUA will remain  in effect (meaning this test can be used) for the duration of the  Covid-19 declaration under Section 564(b)(1) of the Act, 21  U.S.C. section 360bbb-3(b)(1), unless the authorization is  terminated or revoked. Performed at [facility], [address] [place], [place] [address]   Blood Culture (routine x 2)     Status: None (Preliminary result)   Collection Time: 01/11/20  3:43 PM   Specimen: BLOOD  Result Value Ref Range Status   Specimen Description   Final    BLOOD RIGHT ANTECUBITAL Performed at [facility], [address] [place], [place] [address]    Special Requests   Final    BOTTLES DRAWN AEROBIC AND ANAEROBIC Blood Culture adequate volume Performed at [facility], [address] [place], [place] [address]    Culture   Final    NO GROWTH 3 DAYS Performed at [facility], [address] [place], [place] [address]    Report Status PENDING  Incomplete  MRSA PCR Screening     Status: None   Collection Time: 01/11/20  5:27 PM   Specimen: Nasopharyngeal  Result Value Ref Range Status   MRSA by PCR NEGATIVE NEGATIVE Final    Comment:        The GeneXpert MRSA Assay (FDA approved for NASAL specimens only), is one component of a comprehensive MRSA colonization surveillance program. It is not intended to diagnose MRSA infection nor to guide or monitor treatment for MRSA infections. Performed at [facility], [address] [place], [place] [address]   Urine culture     Status: None   Collection Time: 01/11/20  7:42 PM   Specimen: Urine, Catheterized  Result Value Ref Range Status   Specimen Description   Final    URINE, CATHETERIZED Performed at [facility], [address] [place], [place] [address]    Special Requests   Final    NONE Performed at [facility], [address] [place], [place] [address]    Culture   Final    NO GROWTH Performed at [facility], [address] [place], [place] [address]    Report Status 01/13/2020 FINAL  Final     Radiology Studies: No results found.  Scheduled Meds: . amLODipine  5 mg Oral Daily  . amoxicillin-clavulanate  1 tablet Oral Q12H  . aspirin EC  81 mg Oral Daily  . enoxaparin (LOVENOX) injection  40 mg Subcutaneous Q24H  . gabapentin  200 mg Oral BID  . multivitamin with minerals  1 tablet Oral Daily  . QUEtiapine  12.5 mg Oral BID  . saccharomyces boulardii  250 mg Oral BID   Continuous Infusions:   LOS: 4 days   Amy BarbaraStephen Kennon Encinas, MD [facility] Pager On Amion  If 7PM-7AM, please contact night-coverage 01/15/2020, 11:52 AM

## 2020-01-15 NOTE — Progress Notes (Signed)
Occupational Therapy Treatment Patient Details Name: Amy Whitehead MRN: [id number] DOB: 1933/07/27 Today's Date: 01/15/2020    History of present illness 86 y.o. female with medical history significant for polyneuropathy who presented to [facility] ED due to confusion at home where she lives alone and independently.She reports that her dog scratched/bit her right hand a few days prior.  She has had pain and swelling in that area/cellulitis.   OT comments  Pt. Was cooperative during session. Pt is oriented to person only. Pt. Is able to follow basic directions with cues. Pt. Is requiring asist with ADLs and mobility. Pt. Will need further OT.   Follow Up Recommendations  SNF;Supervision/Assistance - 24 hour    Equipment Recommendations       Recommendations for Other Services      Precautions / Restrictions Precautions Precautions: Fall Restrictions Weight Bearing Restrictions: No       Mobility Bed Mobility                  Transfers   Equipment used: Rolling walker (2 wheeled) Transfers: Stand Pivot Transfers Sit to Stand: Min assist Stand pivot transfers: Min assist            Balance     Sitting balance-Leahy Scale: Good       Standing balance-Leahy Scale: Fair                             ADL either performed or assessed with clinical judgement   ADL   Eating/Feeding: Modified independent;Sitting   Grooming: Set up;Supervision/safety;Sitting           Upper Body Dressing : Supervision/safety;Set up;Sitting   Lower Body Dressing: Minimal assistance;Sit to/from stand   Toilet Transfer: Minimal assistance;Ambulation;RW Toilet Transfer Details (indicate cue type and reason): bed>ambulate in hallway>sit in recliner Toileting- Clothing Manipulation and Hygiene: Minimal assistance;Sit to/from stand               Vision       Perception     Praxis      Cognition Arousal/Alertness: Awake/alert Behavior During Therapy: WFL for  tasks assessed/performed Overall Cognitive Status: Impaired/Different from baseline Area of Impairment: Orientation;Following commands;Safety/judgement;Problem solving                 Orientation Level: Place;Situation;Time     Following Commands: Follows one step commands consistently Safety/Judgement: Decreased awareness of safety;Decreased awareness of deficits   Problem Solving: Requires verbal cues;Requires tactile cues          Exercises     Shoulder Instructions       General Comments      Pertinent Vitals/ Pain       Pain Assessment: No/denies pain  Home Living                                          Prior Functioning/Environment              Frequency  Min 2X/week        Progress Toward Goals  OT Goals(current goals can now be found in the care plan section)  Progress towards OT goals: Progressing toward goals  Acute Rehab OT Goals OT Goal Formulation: With patient Time For Goal Achievement: 01/26/20 Potential to Achieve Goals: Good ADL Goals Pt Will Perform Grooming: with supervision;standing Pt Will Perform Upper  Body Bathing: with supervision;sitting;standing Pt Will Perform Lower Body Bathing: with supervision;sit to/from stand Pt Will Perform Upper Body Dressing: with supervision;sitting;standing Pt Will Perform Lower Body Dressing: with set-up;with supervision;sit to/from stand Pt Will Transfer to Toilet: with supervision;ambulating;regular height toilet;grab bars Pt Will Perform Toileting - Clothing Manipulation and hygiene: with supervision;sit to/from stand Additional ADL Goal #1: Pt will be independent in and OOB for basic ADLs (HOB flat, no rail) Additional ADL Goal #2: Pt will be able to complete pill box test with no more than 2 errors  Plan      Co-evaluation                 AM-PAC OT "6 Clicks" Daily Activity     Outcome Measure   Help from another person eating meals?: None Help from  another person taking care of personal grooming?: A Little Help from another person toileting, which includes using toliet, bedpan, or urinal?: A Little Help from another person bathing (including washing, rinsing, drying)?: A Little Help from another person to put on and taking off regular upper body clothing?: A Little Help from another person to put on and taking off regular lower body clothing?: A Little 6 Click Score: 19    End of Session Equipment Utilized During Treatment: Gait belt;Rolling walker  OT Visit Diagnosis: Unsteadiness on feet (R26.81);Other abnormalities of gait and mobility (R26.89);Other symptoms and signs involving cognitive function;Pain   Activity Tolerance Patient tolerated treatment well   Patient Left in chair;with call bell/phone within reach;with chair alarm set   Nurse Communication Mobility status        Time: 9323-5573 OT Time Calculation (min): 31 min  Charges: OT General Charges $OT Visit: 1 Visit OT Treatments $Self Care/Home Management : 23-37 mins  Derrek Gu OT/L    Vahe Pienta 01/15/2020, 10:15 AM

## 2020-01-16 DIAGNOSIS — R410 Disorientation, unspecified: Secondary | ICD-10-CM | POA: Diagnosis not present

## 2020-01-16 DIAGNOSIS — R4182 Altered mental status, unspecified: Secondary | ICD-10-CM | POA: Diagnosis not present

## 2020-01-16 DIAGNOSIS — L03113 Cellulitis of right upper limb: Secondary | ICD-10-CM | POA: Diagnosis not present

## 2020-01-16 LAB — COMPREHENSIVE METABOLIC PANEL
ALT: 21 U/L (ref 0–44)
AST: 26 U/L (ref 15–41)
Albumin: 3.1 g/dL — ABNORMAL LOW (ref 3.5–5.0)
Alkaline Phosphatase: 58 U/L (ref 38–126)
Anion gap: 6 (ref 5–15)
BUN: 15 mg/dL (ref 8–23)
CO2: 29 mmol/L (ref 22–32)
Calcium: 8.8 mg/dL — ABNORMAL LOW (ref 8.9–10.3)
Chloride: 106 mmol/L (ref 98–111)
Creatinine, Ser: 0.73 mg/dL (ref 0.44–1.00)
GFR, Estimated: >60 mL/min (ref >60)
Glucose, Bld: 98 mg/dL (ref 70–99)
Potassium: 4.3 mmol/L (ref 3.5–5.1)
Sodium: 141 mmol/L (ref 135–145)
Total Bilirubin: 0.4 mg/dL (ref 0.3–1.2)
Total Protein: 5.9 g/dL — ABNORMAL LOW (ref 6.5–8.1)

## 2020-01-16 LAB — CULTURE, BLOOD (ROUTINE X 2)
Culture: NO GROWTH
Culture: NO GROWTH
Special Requests: ADEQUATE

## 2020-01-16 LAB — SARS CORONAVIRUS 2 BY RT PCR (HOSPITAL ORDER, PERFORMED IN ~~LOC~~ HOSPITAL LAB): SARS Coronavirus 2: NEGATIVE

## 2020-01-16 LAB — CBC
HCT: 45.1 % (ref 36.0–46.0)
Hemoglobin: 14.6 g/dL (ref 12.0–15.0)
MCH: 31.7 pg (ref 26.0–34.0)
MCHC: 32.4 g/dL (ref 30.0–36.0)
MCV: 98 fL (ref 80.0–100.0)
Platelets: 269 10*3/uL (ref 150–400)
RBC: 4.6 MIL/uL (ref 3.87–5.11)
RDW: 13.6 % (ref 11.5–15.5)
WBC: 6.9 10*3/uL (ref 4.0–10.5)
nRBC: 0 % (ref 0.0–0.2)

## 2020-01-16 NOTE — Progress Notes (Signed)
PROGRESS NOTE    Amy Whitehead  KGY:[id number] DOB: 04-21-1933 DOA: 01/11/2020 PCP: Arnette Felts, FNP    Brief Narrative:  86 y.o.femalewith medical history significant forpolyneuropathy on gabapentin who presented to [facility] ED due to confusion at home where she lives alone and independent of her ADLs. Patient arrived via EMS from home with altered mental status. Patient'sfriend did not see her at church on the day of presentation so called EMS who found her at home confused. She reports that her dog scratched her right hand a few days prior. She had erythema, significant edema, warmth and tenderness in the right dorsum hand.  X-rays were negative for fracture or dislocation.  She is afebrile and has a normal white count.Received Unasyn in the ED. TR[facility], hospitalist team, was asked to admit.   ED Course: CT head unremarkable for any acute intracranial findings.  Urine culture was negative.  Hospital course complicated by delirium.  Per her daughter she has had episodes of confusion off and on for the last 6 months.  No official diagnosis of dementia  Assessment & Plan:   Active Problems:   AMS (altered mental status)   Acute metabolic encephalopathy secondary to acute infective process, right hand cellulitis -CT head no contrast done on 01/11/2020 no evidence of acute intracranial findings. -Exam with no focal findings, motor and sensation were intact. -UA no evidence of bacteria or nitrite but UA wbc 6-10.  Ucx negative, no growth.  Etoh level negative  -On seroquel 12.5 mg BID -Continue with fall precautions  Right hand cellulitis, likely from her dog scratch.  MRSA screening test negative. Received a dose of Unasyn in the ED and 1 dose of IV vancomycin. Was continued on IV unasyn 3 gm q6H. Transitioned to PO augmentin 11/18, anticipate completing tx through 11/20 for 7 day course of abx Continue with analgesic as needed Follow blood cultures, negative to date Still  edematous with erythema, warmth and tenderness but seems to be improving  Acute urinary retention Continue to monitor UO Has external urinary catheter  Elevated BP, possibly relatedtopain, improving No oral antihypertensivesseenin her list of home medications Initial blood pressure on presentation of 159/69, still suboptimally controlled at this time Possibly associated with pain Pain control for cellulitis Cont on Norvasc 5 mg daily. BP stable at present  Polyneuropathy Continue home gabapentin  Ambulatory dysfunction PT OT evaluated and recommended SNF Fall precautions Remains off restraints as of 11/17 TOC following for SNF placement  Possible Dementia Reported possible hx of prior dementia prior to admit Mentation now much improved with above abx Will benefit from formal outpt work up for dementia  DVT prophylaxis: Lovenox subq Code Status: Full Family Communication: Pt in room, family not at bedside  Status is: Inpatient  Remains inpatient appropriate because:Unsafe d/c plan  Dispo:  Patient From: Home  Planned Disposition: Skilled Nursing Facility  Expected discharge date: 01/17/20  Medically stable for discharge: No   Consultants:     Procedures:     Antimicrobials: Anti-infectives (From admission, onward)   Start     Dose/Rate Route Frequency Ordered Stop   01/14/20 1800  amoxicillin-clavulanate (AUGMENTIN) 875-125 MG per tablet 1 tablet        1 tablet Oral Every 12 hours 01/14/20 1331 01/17/20 1800   01/12/20 2000  vancomycin (VANCOREADY) IVPB 750 mg/150 mL  Status:  Discontinued        750 mg 150 mL/hr over 60 Minutes Intravenous Every 24 hours 01/11/20 1940 01/12/20 0545  01/11/20 2200  Ampicillin-Sulbactam (UNASYN) 3 g in sodium chloride 0.9 % 100 mL IVPB  Status:  Discontinued        3 g 200 mL/hr over 30 Minutes Intravenous Every 6 hours 01/11/20 1901 01/14/20 1331   01/11/20 2000  vancomycin (VANCOCIN) IVPB 1000 mg/200 mL premix         1,000 mg 200 mL/hr over 60 Minutes Intravenous  Once 01/11/20 1940 01/11/20 2233   01/11/20 1515  Ampicillin-Sulbactam (UNASYN) 3 g in sodium chloride 0.9 % 100 mL IVPB        3 g 200 mL/hr over 30 Minutes Intravenous  Once 01/11/20 1457 01/11/20 1630   01/11/20 1445  vancomycin (VANCOCIN) IVPB 1000 mg/200 mL premix  Status:  Discontinued        1,000 mg 200 mL/hr over 60 Minutes Intravenous  Once 01/11/20 1438 01/11/20 1448   01/11/20 1445  cefTRIAXone (ROCEPHIN) 2 g in sodium chloride 0.9 % 100 mL IVPB  Status:  Discontinued        2 g 200 mL/hr over 30 Minutes Intravenous  Once 01/11/20 1438 01/11/20 1454      Subjective: Very drowsy this AM. Family at bedside reports pt did not sleep until late last night  Objective: Vitals:   01/15/20 1000 01/15/20 1427 01/15/20 2358 01/16/20 0928  BP: 140/63 130/61 (!) 142/67 (!) 153/81  Pulse: 67 70 72 71  Resp:  18 16 16   Temp:  97.9 F (36.6 C)  98 F (36.7 C)  TempSrc:  Oral  Oral  SpO2:  (!) 88% 99% 100%  Weight:      Height:       No intake or output data in the 24 hours ending 01/16/20 1447 Filed Weights   01/11/20 2006 01/11/20 2047  Weight: 56.7 kg 51.3 kg    Examination: General exam: Sleepy but able to awake and converse briefly, in no acute distress Respiratory system: normal chest rise, clear, no audible wheezing Cardiovascular system: regular rhythm, s1-s2 Gastrointestinal system: Nondistended, nontender, pos BS Central nervous system: No seizures, no tremors Extremities: No cyanosis, no joint deformities Skin: No rashes, no pallor Psychiatry: Affect normal // no auditory hallucinations   Data Reviewed: I have personally reviewed following labs and imaging studies  CBC: Recent Labs  Lab 01/11/20 1436 01/12/20 0504 01/14/20 0558 01/15/20 0523 01/16/20 0632  WBC 9.9 7.3 5.6 6.0 6.9  NEUTROABS 8.4* 5.7  --   --   --   HGB 15.5* 13.8 14.5 15.5* 14.6  HCT 47.1* 42.2 44.0 47.2* 45.1  MCV 98.3 98.1 97.8  98.7 98.0  PLT 230 207 215 267 269   Basic Metabolic Panel: Recent Labs  Lab 01/11/20 1436 01/12/20 0504 01/13/20 0504 01/14/20 0558 01/15/20 0523 01/16/20 0632  NA 139 139  --  138 139 141  K 4.6 3.6  --  4.0 3.6 4.3  CL 105 108  --  108 103 106  CO2 26 23  --  23 24 29   GLUCOSE 100* 84  --  66* 89 98  BUN 23 18  --  13 13 15   CREATININE 0.86 0.57  --  0.71 0.65 0.73  CALCIUM 9.1 8.5*  --  8.6* 8.7* 8.8*  MG  --   --  2.1  --   --   --    GFR: Estimated Creatinine Clearance: 42.4 mL/min (by C-G formula based on SCr of 0.73 mg/dL). Liver Function Tests: Recent Labs  Lab 01/11/20 1436 01/12/20 0504  01/15/20 0523 01/16/20 0632  AST ALT ALKPHOS 63 51 62 58  BILITOT 1.2 1.2 0.6 0.4  PROT 7.8 6.1* 6.9 5.9*  ALBUMIN 4.1 3.1* 3.5 3.1*   No results for input(s): LIPASE, AMYLASE in the last 168 hours. Recent Labs  Lab 01/11/20 1437  AMMONIA 10   Coagulation Profile: Recent Labs  Lab 01/11/20 1436  INR 1.0   Cardiac Enzymes: No results for input(s): CKTOTAL, CKMB, CKMBINDEX, TROPONINI in the last 168 hours. BNP (last 3 results) No results for input(s): PROBNP in the last 8760 hours. HbA1C: No results for input(s): HGBA1C in the last 72 hours. CBG: No results for input(s): GLUCAP in the last 168 hours. Lipid Profile: No results for input(s): CHOL, HDL, LDLCALC, TRIG, CHOLHDL, LDLDIRECT in the last 72 hours. Thyroid Function Tests: No results for input(s): TSH, T4TOTAL, FREET4, T3FREE, THYROIDAB in the last 72 hours. Anemia Panel: No results for input(s): VITAMINB12, FOLATE, FERRITIN, TIBC, IRON, RETICCTPCT in the last 72 hours. Sepsis Labs: Recent Labs  Lab 01/11/20 1436  LATICACIDVEN 1.7    Recent Results (from the past 240 hour(s))  Blood Culture (routine x 2)     Status: None   Collection Time: 01/11/20  3:30 PM   Specimen: BLOOD  Result Value Ref Range Status   Specimen Description   Final    BLOOD LEFT  ANTECUBITAL Performed at [facility], [address], [place], [place] [address]    Special Requests   Final    BOTTLES DRAWN AEROBIC AND ANAEROBIC Blood Culture results may not be optimal due to an excessive volume of blood received in culture bottles Performed at [facility], [address] [place], [place] [address]    Culture   Final    NO GROWTH 5 DAYS Performed at [facility], [address] [place], [place] [address]    Report Status 01/16/2020 FINAL  Final  Respiratory Panel by RT PCR (Flu A&B, Covid) - Nasopharyngeal Swab     Status: None   Collection Time: 01/11/20  3:37 PM   Specimen: Nasopharyngeal Swab  Result Value Ref Range Status   SARS Coronavirus 2 by RT PCR NEGATIVE NEGATIVE Final    Comment: (NOTE) SARS-CoV-2 target nucleic acids are NOT DETECTED.  The SARS-CoV-2 RNA is generally detectable in upper respiratoy specimens during the acute phase of infection. The lowest concentration of SARS-CoV-2 viral copies this assay can detect is 131 copies/mL. A negative result does not preclude SARS-Cov-2 infection and should not be used as the sole basis for treatment or other patient management decisions. A negative result may occur with  improper specimen collection/handling, submission of specimen other than nasopharyngeal swab, presence of viral mutation(s) within the areas targeted by this assay, and inadequate number of viral copies (<131 copies/mL). A negative result must be combined with clinical observations, patient history, and epidemiological information. The expected result is Negative.  Fact Sheet for Patients:  https://www.Dettinger.com/  Fact Sheet for Healthcare Providers:  https://www.young.biz/  This test is no t yet approved or cleared by the [place] FDA and  has been authorized for detection and/or diagnosis of SARS-CoV-2 by FDA under an Emergency Use  Authorization (EUA). This EUA will remain  in effect (meaning this test can be used) for the duration of the COVID-19 declaration under Section 564(b)(1) of the Act, 21 U.S.C. section 360bbb-3(b)(1), unless the authorization is terminated or revoked sooner.  Influenza A by PCR NEGATIVE NEGATIVE Final   Influenza B by PCR NEGATIVE NEGATIVE Final    Comment: (NOTE) The Xpert Xpress SARS-CoV-2/FLU/RSV assay is intended as an aid in  the diagnosis of influenza from Nasopharyngeal swab specimens and  should not be used as a sole basis for treatment. Nasal washings and  aspirates are unacceptable for Xpert Xpress SARS-CoV-2/FLU/RSV  testing.  Fact Sheet for Patients: https://www.Persico.com/  Fact Sheet for Healthcare Providers: https://www.young.biz/  This test is not yet approved or cleared by the [place] FDA and  has been authorized for detection and/or diagnosis of SARS-CoV-2 by  FDA under an Emergency Use Authorization (EUA). This EUA will remain  in effect (meaning this test can be used) for the duration of the  Covid-19 declaration under Section 564(b)(1) of the Act, 21  U.S.C. section 360bbb-3(b)(1), unless the authorization is  terminated or revoked. Performed at [facility], [address], [place], [place] [address]   Blood Culture (routine x 2)     Status: None   Collection Time: 01/11/20  3:43 PM   Specimen: BLOOD  Result Value Ref Range Status   Specimen Description   Final    BLOOD RIGHT ANTECUBITAL Performed at [facility], [address] [place], [place] [address]    Special Requests   Final    BOTTLES DRAWN AEROBIC AND ANAEROBIC Blood Culture adequate volume Performed at [facility], [address] [place], [place] [address]    Culture   Final    NO GROWTH 5 DAYS Performed at [facility], [address], [place], [place] [address]    Report  Status 01/16/2020 FINAL  Final  MRSA PCR Screening     Status: None   Collection Time: 01/11/20  5:27 PM   Specimen: Nasopharyngeal  Result Value Ref Range Status   MRSA by PCR NEGATIVE NEGATIVE Final    Comment:        The GeneXpert MRSA Assay (FDA approved for NASAL specimens only), is one component of a comprehensive MRSA colonization surveillance program. It is not intended to diagnose MRSA infection nor to guide or monitor treatment for MRSA infections. Performed at [facility], [address], [place], [place] [address]   Urine culture     Status: None   Collection Time: 01/11/20  7:42 PM   Specimen: Urine, Catheterized  Result Value Ref Range Status   Specimen Description   Final    URINE, CATHETERIZED Performed at [facility], [address], [place], [place] [address]    Special Requests   Final    NONE Performed at [facility], [address], [place], [place] [address]    Culture   Final    NO GROWTH Performed at [facility], [address], [place], [place] [address]    Report Status 01/13/2020 FINAL  Final     Radiology Studies: No results found.  Scheduled Meds: . amLODipine  5 mg Oral Daily  . amoxicillin-clavulanate  1 tablet Oral Q12H  . aspirin EC  81 mg Oral Daily  . enoxaparin (LOVENOX) injection  40 mg Subcutaneous Q24H  . gabapentin  200 mg Oral BID  . multivitamin with minerals  1 tablet Oral Daily  . QUEtiapine  12.5 mg Oral BID  . saccharomyces boulardii  250 mg Oral BID   Continuous Infusions:   LOS: 5 days   Rickey Barbara, MD [facility]  Pager On Amion  If 7PM-7AM, please contact night-coverage 01/16/2020, 2:47 PM

## 2020-01-16 NOTE — Progress Notes (Signed)
Physical Therapy Treatment Patient Details Name: Amy Whitehead MRN: [id number] DOB: [birth year] Today's Date: 01/16/2020    History of Present Illness 86 y.o. female with medical history significant for polyneuropathy who presented to [facility] ED due to confusion at home where she lives alone and independently.She reports that her dog scratched/bit her right hand a few days prior.  She has had pain and swelling in that area/cellulitis.    PT Comments    Pt found sleeping in bed with daughter present. The daughter agrees to therapy for pt; pt is difficult to wake up. AxO x2 (person, place). Attempted to increase arousal and awareness by getting pt EOB, however, she doesn't want to wake up; total A due to lack of compliance. Pt sat EOB for ~3 min but was intentionally laterally leaning the entire time in attempt to get back into supine. She is coaxed into sitting into the chair, but is still total A due to lack of compliance/pretending to be asleep. Once in chair, she maintains a true static sitting balance for ~1 min with her eyes closed; "sleeping". Very good sitting balance on edge of chair. +2 for a slide back into the chair. Left in chair with breakfast in front and daughter in room.  Follow Up Recommendations  SNF     Equipment Recommendations  None recommended by PT    Recommendations for Other Services       Precautions / Restrictions Precautions Precautions: Fall Restrictions Weight Bearing Restrictions: No    Mobility  Bed Mobility Overal bed mobility: Needs Assistance Bed Mobility: Supine to Sit     Supine to sit: Total assist     General bed mobility comments: Total assist to get to EOB because pt non-compliant. Pretending to be asleep. Got pt EOB in attempt to increase arousal  Transfers Overall transfer level: Needs assistance Equipment used: None Transfers: Stand Pivot Transfers Sit to Stand: Total assist;+2 physical assistance Stand pivot transfers: Total assist;+2  physical assistance       General transfer comment: Total assist due to noncompliance. 1 person on each side  Ambulation/Gait             General Gait Details: No gait performed due to pt refusing to "wake up"   Stairs             Wheelchair Mobility    Modified Rankin (Stroke Patients Only)       Balance                                            Cognition Arousal/Alertness: Awake/alert Behavior During Therapy: WFL for tasks assessed/performed Overall Cognitive Status: Impaired/Different from baseline Area of Impairment: Orientation;Attention;Awareness (Pt was very non-compliant for therapy. Trying to lay down while sitting EOB, refusing food or water. Pt assisted to chair)                 Orientation Level: Place;Person     Following Commands: Follows one step commands inconsistently     Problem Solving: Decreased initiation        Exercises      General Comments        Pertinent Vitals/Pain Pain Assessment: Faces Faces Pain Scale: No hurt Pain Intervention(s): Monitored during session    Home Living  Prior Function            PT Goals (current goals can now be found in the care plan section) Acute Rehab PT Goals Patient Stated Goal: to go home to her dog Molly PT Goal Formulation: With patient/family Time For Goal Achievement: 01/26/20 Potential to Achieve Goals: Good Progress towards PT goals: Progressing toward goals    Frequency    Min 3X/week      PT Plan      Co-evaluation              AM-PAC PT "6 Clicks" Mobility   Outcome Measure  Help needed turning from your back to your side while in a flat bed without using bedrails?: A Little Help needed moving from lying on your back to sitting on the side of a flat bed without using bedrails?: A Little Help needed moving to and from a bed to a chair (including a wheelchair)?: A Little Help needed standing up from  a chair using your arms (e.g., wheelchair or bedside chair)?: A Little Help needed to walk in hospital room?: A Little Help needed climbing 3-5 steps with a railing? : A Lot 6 Click Score: 17    End of Session Equipment Utilized During Treatment: Gait belt Activity Tolerance: Other (comment) (Session limited due to lack of compliance) Patient left: in chair;with call bell/phone within reach;with chair alarm set;with family/visitor present   PT Visit Diagnosis: Muscle weakness (generalized) (M62.81);Difficulty in walking, not elsewhere classified (R26.2)     Time: 0938-1829 PT Time Calculation (min) (ACUTE ONLY): 20 min  Charges:  $Therapeutic Activity: 8-22 mins                     C. Alinda Dooms, SPTA [facility] [phone]

## 2020-01-16 NOTE — Progress Notes (Signed)
Per Dr. Rhona Leavens, patient's cellulitis is improving and agree to stop Augmentin tomorrow for total 7-day course of antibiotics.   Lucina Mellow (PharmD Candidate 2022)

## 2020-01-16 NOTE — NC FL2 (Signed)
[place] MEDICAID FL2 LEVEL OF CARE SCREENING TOOL     IDENTIFICATION  Patient Name: Amy Whitehead Birthdate: 1934/01/11 Sex: female Admission Date (Current Location): 01/11/2020  [facility] and [place] Number:  Producer, television/film/video and Address:  [facility],  [address] [place], [place] [address]      Provider Number: [id number]  Attending Physician Name and Address:  Jerald Kief, MD  Relative Name and Phone Number:  Sarina Ill dtr [phone]    Current Level of Care: Hospital Recommended Level of Care: Skilled Nursing Facility Prior Approval Number:    Date Approved/Denied:   PASRR Number: [id number]  Discharge Plan: SNF    Current Diagnoses: Patient Active Problem List   Diagnosis Date Noted  . AMS (altered mental status) 01/11/2020  . Nocturia 03/12/2015  . Varicose veins 07/18/2014  . Plantar fasciitis of left foot 04/17/2011  . Post herpetic neuralgia 04/17/2011  . Health care maintenance 04/17/2011    Orientation RESPIRATION BLADDER Height & Weight     Self  Normal Incontinent Weight: 51.3 kg Height:  5\' 7"  (170.2 cm)  BEHAVIORAL SYMPTOMS/MOOD NEUROLOGICAL BOWEL NUTRITION STATUS      Incontinent Diet (Regular)  AMBULATORY STATUS COMMUNICATION OF NEEDS Skin   Limited Assist Verbally Normal                       Personal Care Assistance Level of Assistance  Bathing, Dressing, Feeding Bathing Assistance: Limited assistance Feeding assistance: Limited assistance Dressing Assistance: Limited assistance     Functional Limitations Info  Sight, Hearing, Speech Sight Info: Adequate Hearing Info: Adequate Speech Info: Adequate    SPECIAL CARE FACTORS FREQUENCY  PT (By licensed PT), OT (By licensed OT)     PT Frequency: 5x week OT Frequency: 5x week            Contractures Contractures Info: Not present    Additional Factors Info  Code Status, Allergies, Psychotropic Code Status Info:  (Full) Allergies Info:   (NKA) Psychotropic Info:  (Seroquel BID see MAR)         Current Medications (01/16/2020):  This is the current hospital active medication list Current Facility-Administered Medications  Medication Dose Route Frequency Provider Last Rate Last Admin  . acetaminophen (TYLENOL) tablet 650 mg  650 mg Oral Q6H PRN 01/18/2020, DO   650 mg at 01/11/20 2127  . amLODipine (NORVASC) tablet 5 mg  5 mg Oral Daily Iota, Carole N, DO   5 mg at 01/16/20 0930  . amoxicillin-clavulanate (AUGMENTIN) 875-125 MG per tablet 1 tablet  1 tablet Oral Q12H 01/18/20, MD   1 tablet at 01/16/20 1840  . aspirin EC tablet 81 mg  81 mg Oral Daily 01/18/20 N, DO   81 mg at 01/16/20 0931  . enoxaparin (LOVENOX) injection 40 mg  40 mg Subcutaneous Q24H 01/18/20 N, DO   40 mg at 01/15/20 2052  . gabapentin (NEURONTIN) capsule 200 mg  200 mg Oral BID 2053 N, DO   200 mg at 01/16/20 0931  . haloperidol (HALDOL) tablet 2 mg  2 mg Oral Q8H PRN 01/18/20 N, DO       Or  . haloperidol lactate (HALDOL) injection 2 mg  2 mg Intramuscular Q8H PRN Dow Adolph N, DO   2 mg at 01/14/20 2350  . hydrALAZINE (APRESOLINE) injection 5 mg  5 mg Intravenous Q6H PRN 01/16/20 N, DO   5  mg at 01/11/20 1857  . multivitamin with minerals tablet 1 tablet  1 tablet Oral Daily Darlin Drop, DO   1 tablet at 01/16/20 0931  . ondansetron (ZOFRAN) injection 4 mg  4 mg Intravenous Q6H PRN Dow Adolph N, DO      . QUEtiapine (SEROQUEL) tablet 12.5 mg  12.5 mg Oral BID Dow Adolph N, DO   12.5 mg at 01/16/20 0930  . saccharomyces boulardii (FLORASTOR) capsule 250 mg  250 mg Oral BID Darlin Drop, DO   250 mg at 01/16/20 1025     Discharge Medications: Please see discharge summary for a list of discharge medications.  Relevant Imaging Results:  Relevant Lab Results:   Additional Information SS#[phone], Olegario Messier, [place]

## 2020-01-16 NOTE — [place] FL2 (Signed)
[place] MEDICAID FL2 LEVEL OF CARE SCREENING TOOL     IDENTIFICATION  Patient Name: Amy Whitehead Birthdate: 1933/07/23 Sex: female Admission Date (Current Location): 01/11/2020  [facility] and [place] Number:  Producer, television/film/video and Address:  [facility],  [address] [place], [place] [address]      Provider Number: [id number]  Attending Physician Name and Address:  Jerald Kief, MD  Relative Name and Phone Number:  Sarina Ill dtr [phone]    Current Level of Care: Hospital Recommended Level of Care: Skilled Nursing Facility Prior Approval Number:    Date Approved/Denied:   PASRR Number:    Discharge Plan: SNF    Current Diagnoses: Patient Active Problem List   Diagnosis Date Noted  . AMS (altered mental status) 01/11/2020  . Nocturia 03/12/2015  . Varicose veins 07/18/2014  . Plantar fasciitis of left foot 04/17/2011  . Post herpetic neuralgia 04/17/2011  . Health care maintenance 04/17/2011    Orientation RESPIRATION BLADDER Height & Weight     Self  Normal Incontinent Weight: 51.3 kg Height:  5\' 7"  (170.2 cm)  BEHAVIORAL SYMPTOMS/MOOD NEUROLOGICAL BOWEL NUTRITION STATUS      Incontinent Diet (Regular)  AMBULATORY STATUS COMMUNICATION OF NEEDS Skin   Limited Assist Verbally Normal                       Personal Care Assistance Level of Assistance  Bathing, Dressing, Feeding Bathing Assistance: Limited assistance Feeding assistance: Limited assistance Dressing Assistance: Limited assistance     Functional Limitations Info  Sight, Hearing, Speech Sight Info: Adequate Hearing Info: Adequate Speech Info: Adequate    SPECIAL CARE FACTORS FREQUENCY  PT (By licensed PT), OT (By licensed OT)     PT Frequency: 5x week OT Frequency: 5x week            Contractures Contractures Info: Not present    Additional Factors Info  Code Status, Allergies, Psychotropic Code Status Info:  (Full) Allergies Info:   (NKA) Psychotropic Info:  (Seroquel BID see MAR)         Current Medications (01/16/2020):  This is the current hospital active medication list Current Facility-Administered Medications  Medication Dose Route Frequency Provider Last Rate Last Admin  . acetaminophen (TYLENOL) tablet 650 mg  650 mg Oral Q6H PRN 01/18/2020, DO   650 mg at 01/11/20 2127  . amLODipine (NORVASC) tablet 5 mg  5 mg Oral Daily [place], Carole N, DO   5 mg at 01/16/20 0930  . amoxicillin-clavulanate (AUGMENTIN) 875-125 MG per tablet 1 tablet  1 tablet Oral Q12H 01/18/20, MD   1 tablet at 01/16/20 [phone]  . aspirin EC tablet 81 mg  81 mg Oral Daily 9485 N, DO   81 mg at 01/16/20 0931  . enoxaparin (LOVENOX) injection 40 mg  40 mg Subcutaneous Q24H 01/18/20 N, DO   40 mg at 01/15/20 2052  . gabapentin (NEURONTIN) capsule 200 mg  200 mg Oral BID 2053 N, DO   200 mg at 01/16/20 0931  . haloperidol (HALDOL) tablet 2 mg  2 mg Oral Q8H PRN 01/18/20 N, DO       Or  . haloperidol lactate (HALDOL) injection 2 mg  2 mg Intramuscular Q8H PRN Dow Adolph N, DO   2 mg at 01/14/20 2350  . hydrALAZINE (APRESOLINE) injection 5 mg  5 mg Intravenous Q6H PRN 01/16/20, DO  5 mg at 01/11/20 1857  . multivitamin with minerals tablet 1 tablet  1 tablet Oral Daily Darlin Drop, DO   1 tablet at 01/16/20 0931  . ondansetron (ZOFRAN) injection 4 mg  4 mg Intravenous Q6H PRN Dow Adolph N, DO      . QUEtiapine (SEROQUEL) tablet 12.5 mg  12.5 mg Oral BID Dow Adolph N, DO   12.5 mg at 01/16/20 0930  . saccharomyces boulardii (FLORASTOR) capsule 250 mg  250 mg Oral BID Darlin Drop, DO   250 mg at 01/16/20 0017     Discharge Medications: Please see discharge summary for a list of discharge medications.  Relevant Imaging Results:  Relevant Lab Results:   Additional Information SS#[phone], Olegario Messier, [place]

## 2020-01-17 DIAGNOSIS — L03113 Cellulitis of right upper limb: Secondary | ICD-10-CM | POA: Diagnosis not present

## 2020-01-17 DIAGNOSIS — R4182 Altered mental status, unspecified: Secondary | ICD-10-CM | POA: Diagnosis not present

## 2020-01-17 MED ORDER — QUETIAPINE FUMARATE 25 MG PO TABS
25.0000 mg | ORAL_TABLET | Freq: Every day | ORAL | Status: DC
  Administered 2020-01-18 – 2020-01-19 (×2): 25 mg via ORAL
  Filled 2020-01-17 (×2): qty 1

## 2020-01-17 NOTE — Progress Notes (Signed)
Patient resting quietly in bed. Respirations 16. Daughter at bedside.

## 2020-01-17 NOTE — Progress Notes (Signed)
PROGRESS NOTE    Amy Whitehead  PJK:[id number] DOB: 1934-01-19 DOA: 01/11/2020 PCP: Arnette Felts, FNP    Brief Narrative:  86 y.o.femalewith medical history significant forpolyneuropathy on gabapentin who presented to [facility] ED due to confusion at home where she lives alone and independent of her ADLs. Patient arrived via EMS from home with altered mental status. Patient'sfriend did not see her at church on the day of presentation so called EMS who found her at home confused. She reports that her dog scratched her right hand a few days prior. She had erythema, significant edema, warmth and tenderness in the right dorsum hand.  X-rays were negative for fracture or dislocation.  She is afebrile and has a normal white count.Received Unasyn in the ED. TR[facility], hospitalist team, was asked to admit.   ED Course: CT head unremarkable for any acute intracranial findings.  Urine culture was negative.  Hospital course complicated by delirium.  Per her daughter she has had episodes of confusion off and on for the last 6 months.  No official diagnosis of dementia  Assessment & Plan:   Active Problems:   AMS (altered mental status)   Acute metabolic encephalopathy secondary to acute infective process, right hand cellulitis -CT head no contrast done on 01/11/2020 no evidence of acute intracranial findings. -Exam with no focal findings, motor and sensation were intact. -UA no evidence of bacteria or nitrite but UA wbc 6-10.  Ucx negative, no growth.  Etoh level negative  -Had been on seroquel 12.5 mg BID, however pt noted to have drowsiness during the daytime, thus will change to 25mg  QHS only -Continue with fall precautions  Right hand cellulitis, likely from her dog scratch.  MRSA screening test negative. Received a dose of Unasyn in the ED and 1 dose of IV vancomycin. Completed course of abx as of today Continue with analgesic as needed Follow blood cultures, negative to date Still  edematous with erythema, warmth and tenderness but seems to be improving  Acute urinary retention Continue to monitor UO Has external urinary catheter  Elevated BP, possibly relatedtopain, improving No oral antihypertensivesseenin her list of home medications Initial blood pressure on presentation of 159/69, still suboptimally controlled at this time Cont on Norvasc 5 mg daily. BP stable at present  Polyneuropathy Continue home gabapentin  Ambulatory dysfunction PT OT evaluated and recommended SNF Fall precautions Remains off restraints as of 11/17 TOC following for SNF placement  Possible Dementia Reported possible hx of prior dementia prior to admit Mentation now much improved with above abx Recommend formal outpt work up and testing for dementia  DVT prophylaxis: Lovenox subq Code Status: Full Family Communication: Pt in room, family remains at bedside  Status is: Inpatient  Remains inpatient appropriate because:Unsafe d/c plan  Dispo:  Patient From: Home  Planned Disposition: Skilled Nursing Facility  Expected discharge date: 01/17/20  Medically stable for discharge: No   Consultants:     Procedures:     Antimicrobials: Anti-infectives (From admission, onward)   Start     Dose/Rate Route Frequency Ordered Stop   01/14/20 1800  amoxicillin-clavulanate (AUGMENTIN) 875-125 MG per tablet 1 tablet        1 tablet Oral Every 12 hours 01/14/20 1331 01/17/20 1800   01/12/20 2000  vancomycin (VANCOREADY) IVPB 750 mg/150 mL  Status:  Discontinued        750 mg 150 mL/hr over 60 Minutes Intravenous Every 24 hours 01/11/20 1940 01/12/20 0545   01/11/20 2200  Ampicillin-Sulbactam (UNASYN) 3  g in sodium chloride 0.9 % 100 mL IVPB  Status:  Discontinued        3 g 200 mL/hr over 30 Minutes Intravenous Every 6 hours 01/11/20 1901 01/14/20 1331   01/11/20 2000  vancomycin (VANCOCIN) IVPB 1000 mg/200 mL premix        1,000 mg 200 mL/hr over 60 Minutes  Intravenous  Once 01/11/20 1940 01/11/20 2233   01/11/20 1515  Ampicillin-Sulbactam (UNASYN) 3 g in sodium chloride 0.9 % 100 mL IVPB        3 g 200 mL/hr over 30 Minutes Intravenous  Once 01/11/20 1457 01/11/20 1630   01/11/20 1445  vancomycin (VANCOCIN) IVPB 1000 mg/200 mL premix  Status:  Discontinued        1,000 mg 200 mL/hr over 60 Minutes Intravenous  Once 01/11/20 1438 01/11/20 1448   01/11/20 1445  cefTRIAXone (ROCEPHIN) 2 g in sodium chloride 0.9 % 100 mL IVPB  Status:  Discontinued        2 g 200 mL/hr over 30 Minutes Intravenous  Once 01/11/20 1438 01/11/20 1454      Subjective: Without complaints this AM, somewhat tired  Objective: Vitals:   01/17/20 0632 01/17/20 1031 01/17/20 1035 01/17/20 1405  BP: (!) 162/71 134/64 134/64 (!) 116/54  Pulse: 66  68 75  Resp: 15   18  Temp: (!) 97.5 F (36.4 C)   99.3 F (37.4 C)  TempSrc: Axillary   Oral  SpO2: 96%  98% 97%  Weight:      Height:        Intake/Output Summary (Last 24 hours) at 01/17/2020 1609 Last data filed at 01/17/2020 1100 Gross per 24 hour  Intake 120 ml  Output 625 ml  Net -505 ml   Filed Weights   01/11/20 2006 01/11/20 2047  Weight: 56.7 kg 51.3 kg    Examination: General exam: Conversant, in no acute distress Respiratory system: normal chest rise, clear, no audible wheezing Cardiovascular system: regular rhythm, s1-s2 Gastrointestinal system: Nondistended, nontender, pos BS Central nervous system: No seizures, no tremors Extremities: No cyanosis, no joint deformities Skin: No rashes, no pallor Psychiatry: Affect normal // no auditory hallucinations   Data Reviewed: I have personally reviewed following labs and imaging studies  CBC: Recent Labs  Lab 01/11/20 1436 01/12/20 0504 01/14/20 0558 01/15/20 0523 01/16/20 0632  WBC 9.9 7.3 5.6 6.0 6.9  NEUTROABS 8.4* 5.7  --   --   --   HGB 15.5* 13.8 14.5 15.5* 14.6  HCT 47.1* 42.2 44.0 47.2* 45.1  MCV 98.3 98.1 97.8 98.7 98.0  PLT  230 207 215 267 269   Basic Metabolic Panel: Recent Labs  Lab 01/11/20 1436 01/12/20 0504 01/13/20 0504 01/14/20 0558 01/15/20 0523 01/16/20 0632  NA 139 139  --  138 139 141  K 4.6 3.6  --  4.0 3.6 4.3  CL 105 108  --  108 103 106  CO2 26 23  --  23 24 29   GLUCOSE 100* 84  --  66* 89 98  BUN 23 18  --  13 13 15   CREATININE 0.86 0.57  --  0.71 0.65 0.73  CALCIUM 9.1 8.5*  --  8.6* 8.7* 8.8*  MG  --   --  2.1  --   --   --    GFR: Estimated Creatinine Clearance: 42.4 mL/min (by C-G formula based on SCr of 0.73 mg/dL). Liver Function Tests: Recent Labs  Lab 01/11/20 1436 01/12/20 0504 01/15/20 [id number]  01/16/20 0632  AST ALT ALKPHOS 63 51 62 58  BILITOT 1.2 1.2 0.6 0.4  PROT 7.8 6.1* 6.9 5.9*  ALBUMIN 4.1 3.1* 3.5 3.1*   No results for input(s): LIPASE, AMYLASE in the last 168 hours. Recent Labs  Lab 01/11/20 1437  AMMONIA 10   Coagulation Profile: Recent Labs  Lab 01/11/20 1436  INR 1.0   Cardiac Enzymes: No results for input(s): CKTOTAL, CKMB, CKMBINDEX, TROPONINI in the last 168 hours. BNP (last 3 results) No results for input(s): PROBNP in the last 8760 hours. HbA1C: No results for input(s): HGBA1C in the last 72 hours. CBG: No results for input(s): GLUCAP in the last 168 hours. Lipid Profile: No results for input(s): CHOL, HDL, LDLCALC, TRIG, CHOLHDL, LDLDIRECT in the last 72 hours. Thyroid Function Tests: No results for input(s): TSH, T4TOTAL, FREET4, T3FREE, THYROIDAB in the last 72 hours. Anemia Panel: No results for input(s): VITAMINB12, FOLATE, FERRITIN, TIBC, IRON, RETICCTPCT in the last 72 hours. Sepsis Labs: Recent Labs  Lab 01/11/20 1436  LATICACIDVEN 1.7    Recent Results (from the past 240 hour(s))  Blood Culture (routine x 2)     Status: None   Collection Time: 01/11/20  3:30 PM   Specimen: BLOOD  Result Value Ref Range Status   Specimen Description   Final    BLOOD LEFT ANTECUBITAL Performed at [facility], [address] [place], [place] [address]    Special Requests   Final    BOTTLES DRAWN AEROBIC AND ANAEROBIC Blood Culture results may not be optimal due to an excessive volume of blood received in culture bottles Performed at [facility], [address], [place], [place] [address]    Culture   Final    NO GROWTH 5 DAYS Performed at [facility], [address] [place], [place] [address]    Report Status 01/16/2020 FINAL  Final  Respiratory Panel by RT PCR (Flu A&B, Covid) - Nasopharyngeal Swab     Status: None   Collection Time: 01/11/20  3:37 PM   Specimen: Nasopharyngeal Swab  Result Value Ref Range Status   SARS Coronavirus 2 by RT PCR NEGATIVE NEGATIVE Final    Comment: (NOTE) SARS-CoV-2 target nucleic acids are NOT DETECTED.  The SARS-CoV-2 RNA is generally detectable in upper respiratoy specimens during the acute phase of infection. The lowest concentration of SARS-CoV-2 viral copies this assay can detect is 131 copies/mL. A negative result does not preclude SARS-Cov-2 infection and should not be used as the sole basis for treatment or other patient management decisions. A negative result may occur with  improper specimen collection/handling, submission of specimen other than nasopharyngeal swab, presence of viral mutation(s) within the areas targeted by this assay, and inadequate number of viral copies (<131 copies/mL). A negative result must be combined with clinical observations, patient history, and epidemiological information. The expected result is Negative.  Fact Sheet for Patients:  https://www.Gosch.com/  Fact Sheet for Healthcare Providers:  https://www.young.biz/  This test is no t yet approved or cleared by the [place] FDA and  has been authorized for detection and/or diagnosis of SARS-CoV-2 by FDA under an Emergency Use Authorization (EUA). This EUA will remain   in effect (meaning this test can be used) for the duration of the COVID-19 declaration under Section 564(b)(1) of the Act, 21 U.S.C. section 360bbb-3(b)(1), unless the authorization is terminated or revoked sooner.  Influenza A by PCR NEGATIVE NEGATIVE Final   Influenza B by PCR NEGATIVE NEGATIVE Final    Comment: (NOTE) The Xpert Xpress SARS-CoV-2/FLU/RSV assay is intended as an aid in  the diagnosis of influenza from Nasopharyngeal swab specimens and  should not be used as a sole basis for treatment. Nasal washings and  aspirates are unacceptable for Xpert Xpress SARS-CoV-2/FLU/RSV  testing.  Fact Sheet for Patients: https://www.Sieben.com/  Fact Sheet for Healthcare Providers: https://www.young.biz/  This test is not yet approved or cleared by the [place] FDA and  has been authorized for detection and/or diagnosis of SARS-CoV-2 by  FDA under an Emergency Use Authorization (EUA). This EUA will remain  in effect (meaning this test can be used) for the duration of the  Covid-19 declaration under Section 564(b)(1) of the Act, 21  U.S.C. section 360bbb-3(b)(1), unless the authorization is  terminated or revoked. Performed at [facility], [address], [place], [place] [address]   Blood Culture (routine x 2)     Status: None   Collection Time: 01/11/20  3:43 PM   Specimen: BLOOD  Result Value Ref Range Status   Specimen Description   Final    BLOOD RIGHT ANTECUBITAL Performed at [facility], [address] [place], [place] [address]    Special Requests   Final    BOTTLES DRAWN AEROBIC AND ANAEROBIC Blood Culture adequate volume Performed at [facility], [address], [place], [place] [address]    Culture   Final    NO GROWTH 5 DAYS Performed at [facility], [address], [place], [place] [address]    Report Status 01/16/2020 FINAL  Final  MRSA PCR  Screening     Status: None   Collection Time: 01/11/20  5:27 PM   Specimen: Nasopharyngeal  Result Value Ref Range Status   MRSA by PCR NEGATIVE NEGATIVE Final    Comment:        The GeneXpert MRSA Assay (FDA approved for NASAL specimens only), is one component of a comprehensive MRSA colonization surveillance program. It is not intended to diagnose MRSA infection nor to guide or monitor treatment for MRSA infections. Performed at [facility], [address], [place], [place] [address]   Urine culture     Status: None   Collection Time: 01/11/20  7:42 PM   Specimen: Urine, Catheterized  Result Value Ref Range Status   Specimen Description   Final    URINE, CATHETERIZED Performed at [facility], [address] [place], [place] [address]    Special Requests   Final    NONE Performed at [facility], [address], [place], [place] [address]    Culture   Final    NO GROWTH Performed at [facility], [address] [place], [place] [address]    Report Status 01/13/2020 FINAL  Final  SARS Coronavirus 2 by RT PCR (hospital order, performed in [facility] hospital lab) Nasopharyngeal Nasopharyngeal Swab     Status: None   Collection Time: 01/16/20  2:15 PM   Specimen: Nasopharyngeal Swab  Result Value Ref Range Status   SARS Coronavirus 2 NEGATIVE NEGATIVE Final    Comment: (NOTE) SARS-CoV-2 target nucleic acids are NOT DETECTED.  The SARS-CoV-2 RNA is generally detectable in upper and lower respiratory specimens during the acute phase of infection. The lowest concentration of SARS-CoV-2 viral copies this assay can detect is 250 copies /  mL. A negative result does not preclude SARS-CoV-2 infection and should not be used as the sole basis for treatment or other patient management decisions.  A negative result may occur with improper specimen collection / handling, submission of specimen other than  nasopharyngeal swab, presence of viral mutation(s) within the areas targeted by this assay, and inadequate number of viral copies (<250 copies / mL). A negative result must be combined with clinical observations, patient history, and epidemiological information.  Fact Sheet for Patients:   BoilerBrush.com.cy  Fact Sheet for Healthcare Providers: https://pope.com/  This test is not yet approved or  cleared by the [place] FDA and has been authorized for detection and/or diagnosis of SARS-CoV-2 by FDA under an Emergency Use Authorization (EUA).  This EUA will remain in effect (meaning this test can be used) for the duration of the COVID-19 declaration under Section 564(b)(1) of the Act, 21 U.S.C. section 360bbb-3(b)(1), unless the authorization is terminated or revoked sooner.  Performed at [facility], [address] [place], [place] [address]      Radiology Studies: No results found.  Scheduled Meds: . amLODipine  5 mg Oral Daily  . amoxicillin-clavulanate  1 tablet Oral Q12H  . aspirin EC  81 mg Oral Daily  . enoxaparin (LOVENOX) injection  40 mg Subcutaneous Q24H  . gabapentin  200 mg Oral BID  . multivitamin with minerals  1 tablet Oral Daily  . [START ON 01/18/2020] QUEtiapine  25 mg Oral QHS  . saccharomyces boulardii  250 mg Oral BID   Continuous Infusions:   LOS: 6 days   Rickey Barbara, MD [facility] Pager On Amion  If 7PM-7AM, please contact night-coverage 01/17/2020, 4:09 PM

## 2020-01-18 DIAGNOSIS — L03011 Cellulitis of right finger: Secondary | ICD-10-CM | POA: Diagnosis not present

## 2020-01-18 DIAGNOSIS — G9341 Metabolic encephalopathy: Secondary | ICD-10-CM

## 2020-01-18 DIAGNOSIS — G629 Polyneuropathy, unspecified: Secondary | ICD-10-CM

## 2020-01-18 DIAGNOSIS — R4182 Altered mental status, unspecified: Secondary | ICD-10-CM | POA: Diagnosis not present

## 2020-01-18 DIAGNOSIS — F10231 Alcohol dependence with withdrawal delirium: Secondary | ICD-10-CM | POA: Diagnosis not present

## 2020-01-18 NOTE — Plan of Care (Signed)
  Problem: Health Behavior/Discharge Planning: Goal: Ability to manage health-related needs will improve Outcome: Progressing   Problem: Activity: Goal: Risk for activity intolerance will decrease Outcome: Progressing   Problem: Nutrition: Goal: Adequate nutrition will be maintained Outcome: Progressing   Problem: Elimination: Goal: Will not experience complications related to bowel motility Outcome: Progressing Goal: Will not experience complications related to urinary retention Outcome: Progressing   Problem: Pain Managment: Goal: General experience of comfort will improve Outcome: Progressing   

## 2020-01-18 NOTE — Progress Notes (Signed)
PROGRESS NOTE  Amy Whitehead ZHY:[id number] DOB: 07/09/1933   PCP: Arnette FeltsMoore, Janece, FNP  Patient is from: Home.  Lives alone.  Independent for ADLs at baseline.  DOA: 01/11/2020 LOS: 7  Chief complaints: Altered mental status  Brief Narrative / Interim history: 86 year old female with history of polyneuropathy on gabapentin brought to ED by EMS due to acute confusion at home.  Reportedly had right hand erythema, edema, warmth and tenderness after scratched by her dog few days prior to presentation.   In ED, afebrile.  No leukocytosis.  Started on Unasyn.  CT head without acute finding.  Cultures obtained.  She was admitted for acute metabolic encephalopathy due to right hand cellulitis.  Urine culture negative.  Hospital course complicated by delirium.  Therapy recommended SNF.  Subjective: Seen and examined earlier this morning.  No major events overnight or this morning.  She is a sleepy but wakes to voice.  Doesn't want to be bothered.  Not cooperative with interview but not agitated.   Objective: Vitals:   01/17/20 1405 01/17/20 1959 01/18/20 0548 01/18/20 1033  BP: (!) 116/54 (!) 126/57 (!) 142/80 (!) 143/67  Pulse: 75 69 65 61  Resp: 18 18 18    Temp: 99.3 F (37.4 C) 98.2 F (36.8 C) 98 F (36.7 C)   TempSrc: Oral Oral Oral   SpO2: 97% 96% 98%   Weight:      Height:        Intake/Output Summary (Last 24 hours) at 01/18/2020 1334 Last data filed at 01/18/2020 0612 Gross per 24 hour  Intake --  Output 650 ml  Net -650 ml   Filed Weights   01/11/20 2006 01/11/20 2047  Weight: 56.7 kg 51.3 kg    Examination:  GENERAL: No apparent distress.  Nontoxic. HEENT: MMM.  Vision and hearing grossly intact.  NECK: Supple.  No apparent JVD.  RESP: On room air.  No IWOB.  Fair aeration bilaterally. CVS:  RRR. Heart sounds normal.  ABD/GI/GU: BS+. Abd soft, NTND.  MSK/EXT:  Moves extremities. No apparent deformity. No edema. SKIN: no apparent skin lesion or wound NEURO:  Sleepy but wakes to voice.  Not cooperative to assess orientation.  No apparent focal neuro deficit. PSYCH: Calm. Normal affect.  Procedures:  None  Microbiology summarized: COVID-19, influenza and RSV PCR nonreactive. Urine cultures NGTD.  Assessment & Plan: Acute encephalopathy-multifactorial including metabolic encephalopathy from right hand cellulitis and delirium with possible underlying cognitive impairment/dementia.  Work-up including CT head, urine culture, EtOH level unrevealing.  Completed antibiotic course for right hand cellulitis.  No focal neuro deficit to suggest CVA. -Reorientation and delirium precautions. -Fall precautions. -Continue Seroquel 25 mg nightly -IV Haldol as needed  Right hand cellulitis,likely fromherdog scratch: No leukocytosis or fever.  Blood cultures NGTD. MRSA screening test negative.  -Received antibiotics 11/14 through 11/17  Acute urinary retention: Resolved. -Monitor urine output  Elevated BP: SBP 140s. -Continue amlodipine 5 mg daily  Polyneuropathy -Continue home gabapentin  Ambulatory dysfunction -Therapy recommended SNF.   Body mass index is 17.71 kg/m.         DVT prophylaxis:  enoxaparin (LOVENOX) injection 40 mg Start: 01/11/20 2200  Code Status: Full code Family Communication: Patient and/or RN. Available if any question.  Status is: Inpatient  Remains inpatient appropriate because:Hemodynamically unstable and Unsafe d/c plan   Dispo:  Patient From: Home  Planned Disposition: Skilled Nursing Facility  Expected discharge date: 01/17/20  Medically stable for discharge: No  Consultants:  None   Sch Meds:  Scheduled Meds: . amLODipine  5 mg Oral Daily  . aspirin EC  81 mg Oral Daily  . enoxaparin (LOVENOX) injection  40 mg Subcutaneous Q24H  . gabapentin  200 mg Oral BID  . multivitamin with minerals  1 tablet Oral Daily  . QUEtiapine  25 mg Oral QHS  . saccharomyces boulardii  250 mg  Oral BID   Continuous Infusions: PRN Meds:.acetaminophen, haloperidol **OR** haloperidol lactate, hydrALAZINE, ondansetron (ZOFRAN) IV  Antimicrobials: Anti-infectives (From admission, onward)   Start     Dose/Rate Route Frequency Ordered Stop   01/14/20 1800  amoxicillin-clavulanate (AUGMENTIN) 875-125 MG per tablet 1 tablet        1 tablet Oral Every 12 hours 01/14/20 1331 01/17/20 1801   01/12/20 2000  vancomycin (VANCOREADY) IVPB 750 mg/150 mL  Status:  Discontinued        750 mg 150 mL/hr over 60 Minutes Intravenous Every 24 hours 01/11/20 1940 01/12/20 0545   01/11/20 2200  Ampicillin-Sulbactam (UNASYN) 3 g in sodium chloride 0.9 % 100 mL IVPB  Status:  Discontinued        3 g 200 mL/hr over 30 Minutes Intravenous Every 6 hours 01/11/20 1901 01/14/20 1331   01/11/20 2000  vancomycin (VANCOCIN) IVPB 1000 mg/200 mL premix        1,000 mg 200 mL/hr over 60 Minutes Intravenous  Once 01/11/20 1940 01/11/20 2233   01/11/20 1515  Ampicillin-Sulbactam (UNASYN) 3 g in sodium chloride 0.9 % 100 mL IVPB        3 g 200 mL/hr over 30 Minutes Intravenous  Once 01/11/20 1457 01/11/20 1630   01/11/20 1445  vancomycin (VANCOCIN) IVPB 1000 mg/200 mL premix  Status:  Discontinued        1,000 mg 200 mL/hr over 60 Minutes Intravenous  Once 01/11/20 1438 01/11/20 1448   01/11/20 1445  cefTRIAXone (ROCEPHIN) 2 g in sodium chloride 0.9 % 100 mL IVPB  Status:  Discontinued        2 g 200 mL/hr over 30 Minutes Intravenous  Once 01/11/20 1438 01/11/20 1454       I have personally reviewed the following labs and images: CBC: Recent Labs  Lab 01/11/20 1436 01/12/20 0504 01/14/20 0558 01/15/20 0523 01/16/20 0632  WBC 9.9 7.3 5.6 6.0 6.9  NEUTROABS 8.4* 5.7  --   --   --   HGB 15.5* 13.8 14.5 15.5* 14.6  HCT 47.1* 42.2 44.0 47.2* 45.1  MCV 98.3 98.1 97.8 98.7 98.0  PLT 230 207 215 267 269   BMP &GFR Recent Labs  Lab 01/11/20 1436 01/12/20 0504 01/13/20 0504 01/14/20 0558 01/15/20 0523  01/16/20 0632  NA 139 139  --  138 139 141  K 4.6 3.6  --  4.0 3.6 4.3  CL 105 108  --  108 103 106  CO2 26 23  --  23 24 29   GLUCOSE 100* 84  --  66* 89 98  BUN 23 18  --  13 13 15   CREATININE 0.86 0.57  --  0.71 0.65 0.73  CALCIUM 9.1 8.5*  --  8.6* 8.7* 8.8*  MG  --   --  2.1  --   --   --    Estimated Creatinine Clearance: 42.4 mL/min (by C-G formula based on SCr of 0.73 mg/dL). Liver & Pancreas: Recent Labs  Lab 01/11/20 1436 01/12/20 0504 01/15/20 0523 01/16/20 0632  AST 24 26 31 26   ALT  ALKPHOS 63 51 62 58  BILITOT 1.2 1.2 0.6 0.4  PROT 7.8 6.1* 6.9 5.9*  ALBUMIN 4.1 3.1* 3.5 3.1*   No results for input(s): LIPASE, AMYLASE in the last 168 hours. Recent Labs  Lab 01/11/20 1437  AMMONIA 10   Diabetic: No results for input(s): HGBA1C in the last 72 hours. No results for input(s): GLUCAP in the last 168 hours. Cardiac Enzymes: No results for input(s): CKTOTAL, CKMB, CKMBINDEX, TROPONINI in the last 168 hours. No results for input(s): PROBNP in the last 8760 hours. Coagulation Profile: Recent Labs  Lab 01/11/20 1436  INR 1.0   Thyroid Function Tests: No results for input(s): TSH, T4TOTAL, FREET4, T3FREE, THYROIDAB in the last 72 hours. Lipid Profile: No results for input(s): CHOL, HDL, LDLCALC, TRIG, CHOLHDL, LDLDIRECT in the last 72 hours. Anemia Panel: No results for input(s): VITAMINB12, FOLATE, FERRITIN, TIBC, IRON, RETICCTPCT in the last 72 hours. Urine analysis:    Component Value Date/Time   COLORURINE YELLOW 01/11/2020 1942   APPEARANCEUR CLEAR 01/11/2020 1942   LABSPEC 1.025 01/11/2020 1942   PHURINE 5.0 01/11/2020 1942   GLUCOSEU NEGATIVE 01/11/2020 1942   HGBUR NEGATIVE 01/11/2020 1942   BILIRUBINUR NEGATIVE 01/11/2020 1942   BILIRUBINUR NEG 03/11/2015 1503   KETONESUR 20 (A) 01/11/2020 1942   PROTEINUR 30 (A) 01/11/2020 1942   UROBILINOGEN 0.2 03/11/2015 1503   NITRITE NEGATIVE 01/11/2020 1942   LEUKOCYTESUR NEGATIVE  01/11/2020 1942   Sepsis Labs: Invalid input(s): PROCALCITONIN, LACTICIDVEN  Microbiology: Recent Results (from the past 240 hour(s))  Blood Culture (routine x 2)     Status: None   Collection Time: 01/11/20  3:30 PM   Specimen: BLOOD  Result Value Ref Range Status   Specimen Description   Final    BLOOD LEFT ANTECUBITAL Performed at [facility], [address], [place], [place] [address]    Special Requests   Final    BOTTLES DRAWN AEROBIC AND ANAEROBIC Blood Culture results may not be optimal due to an excessive volume of blood received in culture bottles Performed at [facility], [address], [place], [place] [address]    Culture   Final    NO GROWTH 5 DAYS Performed at [facility], [address] [place], [place] [address]    Report Status 01/16/2020 FINAL  Final  Respiratory Panel by RT PCR (Flu A&B, Covid) - Nasopharyngeal Swab     Status: None   Collection Time: 01/11/20  3:37 PM   Specimen: Nasopharyngeal Swab  Result Value Ref Range Status   SARS Coronavirus 2 by RT PCR NEGATIVE NEGATIVE Final    Comment: (NOTE) SARS-CoV-2 target nucleic acids are NOT DETECTED.  The SARS-CoV-2 RNA is generally detectable in upper respiratoy specimens during the acute phase of infection. The lowest concentration of SARS-CoV-2 viral copies this assay can detect is 131 copies/mL. A negative result does not preclude SARS-Cov-2 infection and should not be used as the sole basis for treatment or other patient management decisions. A negative result may occur with  improper specimen collection/handling, submission of specimen other than nasopharyngeal swab, presence of viral mutation(s) within the areas targeted by this assay, and inadequate number of viral copies (<131 copies/mL). A negative result must be combined with clinical observations, patient history, and epidemiological information. The expected result is Negative.  Fact Sheet  for Patients:  https://www.Seeney.com/  Fact Sheet for Healthcare Providers:  https://www.young.biz/  This test is no t yet  approved or cleared by the [place] and  has been authorized for detection and/or diagnosis of SARS-CoV-2 by FDA under an Emergency Use Authorization (EUA). This EUA will remain  in effect (meaning this test can be used) for the duration of the COVID-19 declaration under Section 564(b)(1) of the Act, 21 U.S.C. section 360bbb-3(b)(1), unless the authorization is terminated or revoked sooner.     Influenza A by PCR NEGATIVE NEGATIVE Final   Influenza B by PCR NEGATIVE NEGATIVE Final    Comment: (NOTE) The Xpert Xpress SARS-CoV-2/FLU/RSV assay is intended as an aid in  the diagnosis of influenza from Nasopharyngeal swab specimens and  should not be used as a sole basis for treatment. Nasal washings and  aspirates are unacceptable for Xpert Xpress SARS-CoV-2/FLU/RSV  testing.  Fact Sheet for Patients: https://www.Iovino.com/  Fact Sheet for Healthcare Providers: https://www.young.biz/  This test is not yet approved or cleared by the [place] FDA and  has been authorized for detection and/or diagnosis of SARS-CoV-2 by  FDA under an Emergency Use Authorization (EUA). This EUA will remain  in effect (meaning this test can be used) for the duration of the  Covid-19 declaration under Section 564(b)(1) of the Act, 21  U.S.C. section 360bbb-3(b)(1), unless the authorization is  terminated or revoked. Performed at [facility], [address], [place], [place] [address]   Blood Culture (routine x 2)     Status: None   Collection Time: 01/11/20  3:43 PM   Specimen: BLOOD  Result Value Ref Range Status   Specimen Description   Final    BLOOD RIGHT ANTECUBITAL Performed at [facility], [address], [place], [place] [address]     Special Requests   Final    BOTTLES DRAWN AEROBIC AND ANAEROBIC Blood Culture adequate volume Performed at [facility], [address], [place], [place] [address]    Culture   Final    NO GROWTH 5 DAYS Performed at [facility], [address] [place], [place] [address]    Report Status 01/16/2020 FINAL  Final  MRSA PCR Screening     Status: None   Collection Time: 01/11/20  5:27 PM   Specimen: Nasopharyngeal  Result Value Ref Range Status   MRSA by PCR NEGATIVE NEGATIVE Final    Comment:        The GeneXpert MRSA Assay (FDA approved for NASAL specimens only), is one component of a comprehensive MRSA colonization surveillance program. It is not intended to diagnose MRSA infection nor to guide or monitor treatment for MRSA infections. Performed at [facility], [address], [place], [place] [address]   Urine culture     Status: None   Collection Time: 01/11/20  7:42 PM   Specimen: Urine, Catheterized  Result Value Ref Range Status   Specimen Description   Final    URINE, CATHETERIZED Performed at [facility], [address] [place], [place] [address]    Special Requests   Final    NONE Performed at [facility], [address], [place], [place] [address]    Culture   Final    NO GROWTH Performed at [facility], [address], [place], [place] [address]    Report Status 01/13/2020 FINAL  Final  SARS Coronavirus 2 by RT PCR (hospital order, performed in [facility] hospital lab) Nasopharyngeal Nasopharyngeal Swab     Status: None   Collection Time:  01/16/20  2:15 PM   Specimen: Nasopharyngeal Swab  Result Value Ref Range Status   SARS Coronavirus 2 NEGATIVE NEGATIVE Final    Comment: (NOTE) SARS-CoV-2 target nucleic acids are NOT DETECTED.  The SARS-CoV-2 RNA is generally detectable in upper and lower respiratory specimens during the acute phase of infection. The  lowest concentration of SARS-CoV-2 viral copies this assay can detect is 250 copies / mL. A negative result does not preclude SARS-CoV-2 infection and should not be used as the sole basis for treatment or other patient management decisions.  A negative result may occur with improper specimen collection / handling, submission of specimen other than nasopharyngeal swab, presence of viral mutation(s) within the areas targeted by this assay, and inadequate number of viral copies (<250 copies / mL). A negative result must be combined with clinical observations, patient history, and epidemiological information.  Fact Sheet for Patients:   BoilerBrush.com.cy  Fact Sheet for Healthcare Providers: https://pope.com/  This test is not yet approved or  cleared by the [place] FDA and has been authorized for detection and/or diagnosis of SARS-CoV-2 by FDA under an Emergency Use Authorization (EUA).  This EUA will remain in effect (meaning this test can be used) for the duration of the COVID-19 declaration under Section 564(b)(1) of the Act, 21 U.S.C. section 360bbb-3(b)(1), unless the authorization is terminated or revoked sooner.  Performed at [facility], [address], [place], [place] [address]     Radiology Studies: No results found.     Enedelia Martorelli T. Theran Vandergrift [facility]  If 7PM-7AM, please contact night-coverage [email or web address] 01/18/2020, 1:34 PM

## 2020-01-19 DIAGNOSIS — R4182 Altered mental status, unspecified: Secondary | ICD-10-CM | POA: Diagnosis not present

## 2020-01-19 DIAGNOSIS — L03011 Cellulitis of right finger: Secondary | ICD-10-CM | POA: Diagnosis not present

## 2020-01-19 DIAGNOSIS — G9341 Metabolic encephalopathy: Secondary | ICD-10-CM | POA: Diagnosis not present

## 2020-01-19 DIAGNOSIS — F10231 Alcohol dependence with withdrawal delirium: Secondary | ICD-10-CM | POA: Diagnosis not present

## 2020-01-19 LAB — RENAL FUNCTION PANEL
Albumin: 3 g/dL — ABNORMAL LOW (ref 3.5–5.0)
Anion gap: 8 (ref 5–15)
BUN: 15 mg/dL (ref 8–23)
CO2: 27 mmol/L (ref 22–32)
Calcium: 8.7 mg/dL — ABNORMAL LOW (ref 8.9–10.3)
Chloride: 104 mmol/L (ref 98–111)
Creatinine, Ser: 0.7 mg/dL (ref 0.44–1.00)
GFR, Estimated: >60 mL/min (ref >60)
Glucose, Bld: 96 mg/dL (ref 70–99)
Phosphorus: 3 mg/dL (ref 2.5–4.6)
Potassium: 4.1 mmol/L (ref 3.5–5.1)
Sodium: 139 mmol/L (ref 135–145)

## 2020-01-19 LAB — CBC
HCT: 45.1 % (ref 36.0–46.0)
Hemoglobin: 14.7 g/dL (ref 12.0–15.0)
MCH: 32.1 pg (ref 26.0–34.0)
MCHC: 32.6 g/dL (ref 30.0–36.0)
MCV: 98.5 fL (ref 80.0–100.0)
Platelets: 288 10*3/uL (ref 150–400)
RBC: 4.58 MIL/uL (ref 3.87–5.11)
RDW: 13.9 % (ref 11.5–15.5)
WBC: 5.9 10*3/uL (ref 4.0–10.5)
nRBC: 0 % (ref 0.0–0.2)

## 2020-01-19 LAB — MAGNESIUM: Magnesium: 2.2 mg/dL (ref 1.7–2.4)

## 2020-01-19 NOTE — TOC Progression Note (Addendum)
Transition of Care [facility]) - Progression Note    Patient Details  Name: Amy Whitehead MRN: [id number] Date of Birth: [birth year]  Transition of Care [facility]) CM/SW Contact  Yussuf Sawyers, Olegario Messier, RN Phone Number: 01/19/2020, 10:26 AM  Clinical Narrative:  TC dtr [name] [phone]-chose [facility]-left vm w/[facility] rep Janie of choice-await call back if able to accept, facility will need to start auth-await call back.covid to be ordered.   2p-[facility] rep Wille Celeste able to accept once they have auth-initiated auth-await auth.  Expected Discharge Plan: Skilled Nursing Facility Barriers to Discharge: Continued Medical Work up  Expected Discharge Plan and Services Expected Discharge Plan: Skilled Nursing Facility   Discharge Planning Services: CM Consult                                           Social Determinants of Health (SDOH) Interventions    Readmission Risk Interventions No flowsheet data found.

## 2020-01-19 NOTE — Plan of Care (Signed)
  Problem: Health Behavior/Discharge Planning: Goal: Ability to manage health-related needs will improve Outcome: Progressing   Problem: Activity: Goal: Risk for activity intolerance will decrease Outcome: Progressing   Problem: Elimination: Goal: Will not experience complications related to bowel motility Outcome: Progressing Goal: Will not experience complications related to urinary retention Outcome: Progressing   Problem: Nutrition: Goal: Adequate nutrition will be maintained Outcome: Not Progressing   Problem: Pain Managment: Goal: General experience of comfort will improve Outcome: Completed/Met

## 2020-01-19 NOTE — Progress Notes (Signed)
PROGRESS NOTE  Amy Whitehead:[id number] DOB: Mar 02, 1933   PCP: Arnette Felts, FNP  Patient is from: Home.  Lives alone.  Independent for ADLs at baseline.  DOA: 01/11/2020 LOS: 8  Chief complaints: Altered mental status  Brief Narrative / Interim history: 86 year old female with history of polyneuropathy on gabapentin brought to ED by EMS due to acute confusion at home.  Reportedly had right hand erythema, edema, warmth and tenderness after scratched by her dog few days prior to presentation.   In ED, afebrile.  No leukocytosis.  Started on Unasyn.  CT head without acute finding.  Cultures obtained.  She was admitted for acute metabolic encephalopathy due to right hand cellulitis.  Urine culture negative.  Hospital course complicated by delirium.  Therapy recommended SNF.  Waiting on insurance authorization.  Subjective: Seen and examined earlier this morning.  No major events overnight of this morning.  No complaints but not a great historian.  Only oriented to self and place.  She denies chest pain, dyspnea, GI or UTI symptoms.  She denies right hand pain.   Objective: Vitals:   01/18/20 1033 01/18/20 1446 01/18/20 2013 01/19/20 0409  BP: (!) 143/67 (!) 130/49 (!) 145/66 126/70  Pulse: 61 60 67 66  Resp:  16 18 16   Temp:  98.2 F (36.8 C) 97.9 F (36.6 C) 98.1 F (36.7 C)  TempSrc:  Oral    SpO2:  96% 95% 96%  Weight:      Height:        Intake/Output Summary (Last 24 hours) at 01/19/2020 1208 Last data filed at 01/19/2020 0525 Gross per 24 hour  Intake 240 ml  Output 700 ml  Net -460 ml   Filed Weights   01/11/20 2006 01/11/20 2047  Weight: 56.7 kg 51.3 kg    Examination:  GENERAL: No apparent distress.  Nontoxic. HEENT: MMM.  Vision and hearing grossly intact.  NECK: Supple.  No apparent JVD.  RESP: On room air.  No IWOB.  Fair aeration bilaterally. CVS:  RRR. Heart sounds normal.  ABD/GI/GU: BS+. Abd soft, NTND.  MSK/EXT:  Moves extremities.  Some  arthritic deformities in both hands.  No erythema, tenderness or swelling. SKIN: no apparent skin lesion or wound NEURO: Awake.  Oriented to self and place.  No apparent focal neuro deficit. PSYCH: Calm. Normal affect.  Procedures:  None  Microbiology summarized: COVID-19, influenza and RSV PCR nonreactive. Urine cultures NGTD.  Assessment & Plan: Acute encephalopathy-multifactorial including metabolic encephalopathy from right hand cellulitis and delirium with possible underlying cognitive impairment/dementia.  Work-up including CT head, urine culture, EtOH level unrevealing.  Completed antibiotic course for right hand cellulitis.  No focal neuro deficit to suggest CVA.  Oriented to self and place today. -Reorientation, fall and delirium precautions.  -Continue Seroquel 25 mg nightly -IV Haldol as needed  Right hand cellulitis,likely fromherdog scratch: No leukocytosis or fever.  Blood cultures NGTD. MRSA screening test negative.  -Received antibiotics 11/14 through 11/17  Acute urinary retention: Resolved. -Monitor urine output  Elevated BP: Normotensive. -Continue amlodipine 5 mg daily  Polyneuropathy -Continue home gabapentin  Ambulatory dysfunction -Therapy recommended SNF.   Body mass index is 17.71 kg/m.         DVT prophylaxis:  enoxaparin (LOVENOX) injection 40 mg Start: 01/11/20 2200  Code Status: Full code Family Communication: Patient and/or RN. Available if any question.  Status is: Inpatient  Remains inpatient appropriate because:Unsafe d/c plan   Dispo:  Patient From: Home  Planned Disposition:  Skilled Nursing Facility  Expected discharge date: 01/20/20  Medically stable for discharge: No        Consultants:  None   Sch Meds:  Scheduled Meds: . amLODipine  5 mg Oral Daily  . aspirin EC  81 mg Oral Daily  . enoxaparin (LOVENOX) injection  40 mg Subcutaneous Q24H  . gabapentin  200 mg Oral BID  . multivitamin with minerals   1 tablet Oral Daily  . QUEtiapine  25 mg Oral QHS  . saccharomyces boulardii  250 mg Oral BID   Continuous Infusions: PRN Meds:.acetaminophen, haloperidol **OR** haloperidol lactate, hydrALAZINE, ondansetron (ZOFRAN) IV  Antimicrobials: Anti-infectives (From admission, onward)   Start     Dose/Rate Route Frequency Ordered Stop   01/14/20 1800  amoxicillin-clavulanate (AUGMENTIN) 875-125 MG per tablet 1 tablet        1 tablet Oral Every 12 hours 01/14/20 1331 01/17/20 1801   01/12/20 2000  vancomycin (VANCOREADY) IVPB 750 mg/150 mL  Status:  Discontinued        750 mg 150 mL/hr over 60 Minutes Intravenous Every 24 hours 01/11/20 1940 01/12/20 0545   01/11/20 2200  Ampicillin-Sulbactam (UNASYN) 3 g in sodium chloride 0.9 % 100 mL IVPB  Status:  Discontinued        3 g 200 mL/hr over 30 Minutes Intravenous Every 6 hours 01/11/20 1901 01/14/20 1331   01/11/20 2000  vancomycin (VANCOCIN) IVPB 1000 mg/200 mL premix        1,000 mg 200 mL/hr over 60 Minutes Intravenous  Once 01/11/20 1940 01/11/20 2233   01/11/20 1515  Ampicillin-Sulbactam (UNASYN) 3 g in sodium chloride 0.9 % 100 mL IVPB        3 g 200 mL/hr over 30 Minutes Intravenous  Once 01/11/20 1457 01/11/20 1630   01/11/20 1445  vancomycin (VANCOCIN) IVPB 1000 mg/200 mL premix  Status:  Discontinued        1,000 mg 200 mL/hr over 60 Minutes Intravenous  Once 01/11/20 1438 01/11/20 1448   01/11/20 1445  cefTRIAXone (ROCEPHIN) 2 g in sodium chloride 0.9 % 100 mL IVPB  Status:  Discontinued        2 g 200 mL/hr over 30 Minutes Intravenous  Once 01/11/20 1438 01/11/20 1454       I have personally reviewed the following labs and images: CBC: Recent Labs  Lab 01/14/20 0558 01/15/20 0523 01/16/20 0632 01/19/20 0537  WBC 5.6 6.0 6.9 5.9  HGB 14.5 15.5* 14.6 14.7  HCT 44.0 47.2* 45.1 45.1  MCV 97.8 98.7 98.0 98.5  PLT 215 267 269 288   BMP &GFR Recent Labs  Lab 01/13/20 0504 01/14/20 0558 01/15/20 0523 01/16/20 0632  01/19/20 0537  NA  --  138 139 141 139  K  --  4.0 3.6 4.3 4.1  CL  --  108 103 106 104  CO2  --  23 24 29 27   GLUCOSE  --  66* 89 98 96  BUN  --  13 13 15 15   CREATININE  --  0.71 0.65 0.73 0.70  CALCIUM  --  8.6* 8.7* 8.8* 8.7*  MG 2.1  --   --   --  2.2  PHOS  --   --   --   --  3.0   Estimated Creatinine Clearance: 42.4 mL/min (by C-G formula based on SCr of 0.7 mg/dL). Liver & Pancreas: Recent Labs  Lab 01/15/20 0523 01/16/20 0632 01/19/20 0537  AST 31 26  --   ALT  23 21  --   ALKPHOS 62 58  --   BILITOT 0.6 0.4  --   PROT 6.9 5.9*  --   ALBUMIN 3.5 3.1* 3.0*   No results for input(s): LIPASE, AMYLASE in the last 168 hours. No results for input(s): AMMONIA in the last 168 hours. Diabetic: No results for input(s): HGBA1C in the last 72 hours. No results for input(s): GLUCAP in the last 168 hours. Cardiac Enzymes: No results for input(s): CKTOTAL, CKMB, CKMBINDEX, TROPONINI in the last 168 hours. No results for input(s): PROBNP in the last 8760 hours. Coagulation Profile: No results for input(s): INR, PROTIME in the last 168 hours. Thyroid Function Tests: No results for input(s): TSH, T4TOTAL, FREET4, T3FREE, THYROIDAB in the last 72 hours. Lipid Profile: No results for input(s): CHOL, HDL, LDLCALC, TRIG, CHOLHDL, LDLDIRECT in the last 72 hours. Anemia Panel: No results for input(s): VITAMINB12, FOLATE, FERRITIN, TIBC, IRON, RETICCTPCT in the last 72 hours. Urine analysis:    Component Value Date/Time   COLORURINE YELLOW 01/11/2020 1942   APPEARANCEUR CLEAR 01/11/2020 1942   LABSPEC 1.025 01/11/2020 1942   PHURINE 5.0 01/11/2020 1942   GLUCOSEU NEGATIVE 01/11/2020 1942   HGBUR NEGATIVE 01/11/2020 1942   BILIRUBINUR NEGATIVE 01/11/2020 1942   BILIRUBINUR NEG 03/11/2015 1503   KETONESUR 20 (A) 01/11/2020 1942   PROTEINUR 30 (A) 01/11/2020 1942   UROBILINOGEN 0.2 03/11/2015 1503   NITRITE NEGATIVE 01/11/2020 1942   LEUKOCYTESUR NEGATIVE 01/11/2020 1942    Sepsis Labs: Invalid input(s): PROCALCITONIN, LACTICIDVEN  Microbiology: Recent Results (from the past 240 hour(s))  Blood Culture (routine x 2)     Status: None   Collection Time: 01/11/20  3:30 PM   Specimen: BLOOD  Result Value Ref Range Status   Specimen Description   Final    BLOOD LEFT ANTECUBITAL Performed at [facility], [address] [place], [place] [address]    Special Requests   Final    BOTTLES DRAWN AEROBIC AND ANAEROBIC Blood Culture results may not be optimal due to an excessive volume of blood received in culture bottles Performed at [facility], [address] [place], [place] [address]    Culture   Final    NO GROWTH 5 DAYS Performed at [facility], [address] [place], [place] [address]    Report Status 01/16/2020 FINAL  Final  Respiratory Panel by RT PCR (Flu A&B, Covid) - Nasopharyngeal Swab     Status: None   Collection Time: 01/11/20  3:37 PM   Specimen: Nasopharyngeal Swab  Result Value Ref Range Status   SARS Coronavirus 2 by RT PCR NEGATIVE NEGATIVE Final    Comment: (NOTE) SARS-CoV-2 target nucleic acids are NOT DETECTED.  The SARS-CoV-2 RNA is generally detectable in upper respiratoy specimens during the acute phase of infection. The lowest concentration of SARS-CoV-2 viral copies this assay can detect is 131 copies/mL. A negative result does not preclude SARS-Cov-2 infection and should not be used as the sole basis for treatment or other patient management decisions. A negative result may occur with  improper specimen collection/handling, submission of specimen other than nasopharyngeal swab, presence of viral mutation(s) within the areas targeted by this assay, and inadequate number of viral copies (<131 copies/mL). A negative result must be combined with clinical observations, patient history, and epidemiological information. The expected result is Negative.  Fact Sheet for Patients:   https://www.Leaton.com/https://www.fda.gov/media/142436/download  Fact Sheet for Healthcare Providers:  https://www.young.biz/https://www.fda.gov/media/142435/download  This test is no  t yet approved or cleared by the [place] and  has been authorized for detection and/or diagnosis of SARS-CoV-2 by FDA under an Emergency Use Authorization (EUA). This EUA will remain  in effect (meaning this test can be used) for the duration of the COVID-19 declaration under Section 564(b)(1) of the Act, 21 U.S.C. section 360bbb-3(b)(1), unless the authorization is terminated or revoked sooner.     Influenza A by PCR NEGATIVE NEGATIVE Final   Influenza B by PCR NEGATIVE NEGATIVE Final    Comment: (NOTE) The Xpert Xpress SARS-CoV-2/FLU/RSV assay is intended as an aid in  the diagnosis of influenza from Nasopharyngeal swab specimens and  should not be used as a sole basis for treatment. Nasal washings and  aspirates are unacceptable for Xpert Xpress SARS-CoV-2/FLU/RSV  testing.  Fact Sheet for Patients: https://www.Dinovo.com/  Fact Sheet for Healthcare Providers: https://www.young.biz/  This test is not yet approved or cleared by the [place] FDA and  has been authorized for detection and/or diagnosis of SARS-CoV-2 by  FDA under an Emergency Use Authorization (EUA). This EUA will remain  in effect (meaning this test can be used) for the duration of the  Covid-19 declaration under Section 564(b)(1) of the Act, 21  U.S.C. section 360bbb-3(b)(1), unless the authorization is  terminated or revoked. Performed at [facility], [address], [place], [place] [address]   Blood Culture (routine x 2)     Status: None   Collection Time: 01/11/20  3:43 PM   Specimen: BLOOD  Result Value Ref Range Status   Specimen Description   Final    BLOOD RIGHT ANTECUBITAL Performed at [facility], [address], [place], [place] [address]    Special Requests    Final    BOTTLES DRAWN AEROBIC AND ANAEROBIC Blood Culture adequate volume Performed at [facility], [address], [place], [place] [address]    Culture   Final    NO GROWTH 5 DAYS Performed at [facility], [address] [place], [place] [address]    Report Status 01/16/2020 FINAL  Final  MRSA PCR Screening     Status: None   Collection Time: 01/11/20  5:27 PM   Specimen: Nasopharyngeal  Result Value Ref Range Status   MRSA by PCR NEGATIVE NEGATIVE Final    Comment:        The GeneXpert MRSA Assay (FDA approved for NASAL specimens only), is one component of a comprehensive MRSA colonization surveillance program. It is not intended to diagnose MRSA infection nor to guide or monitor treatment for MRSA infections. Performed at [facility], [address] [place], [place] [address]   Urine culture     Status: None   Collection Time: 01/11/20  7:42 PM   Specimen: Urine, Catheterized  Result Value Ref Range Status   Specimen Description   Final    URINE, CATHETERIZED Performed at [facility], [address], [place], [place] [address]    Special Requests   Final    NONE Performed at [facility], [address], [place], [place] [address]    Culture   Final    NO GROWTH Performed at [facility], [address], [place], [place] [address]    Report Status 01/13/2020 FINAL  Final  SARS Coronavirus 2 by RT PCR (hospital order, performed in [facility] hospital lab) Nasopharyngeal Nasopharyngeal Swab     Status: None  Collection Time: 01/16/20  2:15 PM   Specimen: Nasopharyngeal Swab  Result Value Ref Range Status   SARS Coronavirus 2 NEGATIVE NEGATIVE Final    Comment: (NOTE) SARS-CoV-2 target nucleic acids are NOT DETECTED.  The SARS-CoV-2 RNA is generally detectable in upper and lower respiratory specimens during the acute phase of infection. The lowest concentration of  SARS-CoV-2 viral copies this assay can detect is 250 copies / mL. A negative result does not preclude SARS-CoV-2 infection and should not be used as the sole basis for treatment or other patient management decisions.  A negative result may occur with improper specimen collection / handling, submission of specimen other than nasopharyngeal swab, presence of viral mutation(s) within the areas targeted by this assay, and inadequate number of viral copies (<250 copies / mL). A negative result must be combined with clinical observations, patient history, and epidemiological information.  Fact Sheet for Patients:   BoilerBrush.com.cy  Fact Sheet for Healthcare Providers: https://pope.com/  This test is not yet approved or  cleared by the [place] FDA and has been authorized for detection and/or diagnosis of SARS-CoV-2 by FDA under an Emergency Use Authorization (EUA).  This EUA will remain in effect (meaning this test can be used) for the duration of the COVID-19 declaration under Section 564(b)(1) of the Act, 21 U.S.C. section 360bbb-3(b)(1), unless the authorization is terminated or revoked sooner.  Performed at [facility], [address], [place], [place] [address]     Radiology Studies: No results found.     Taye T. Gon[facility]  If 7PM-7AM, please contact night-coverage [email or web address] 01/19/2020, 12:08 PM

## 2020-01-19 NOTE — Progress Notes (Signed)
PT Cancellation Note  Patient Details Name: Amy Whitehead MRN: [id number] DOB: September 08, 1933   Cancelled Treatment:     Attempted treat was made by PT. Patient was more aroused and alert today. Eyes open. Makes eye contact and responds to all verbal cues. Recalls names of daughter, friend from church, and pet dog. Firmly rejected PT services today. Will attempt at another time, schedule permitting.   Ellwood Handler, SPTA [facility] [phone]

## 2020-01-20 DIAGNOSIS — G629 Polyneuropathy, unspecified: Secondary | ICD-10-CM | POA: Diagnosis not present

## 2020-01-20 DIAGNOSIS — R410 Disorientation, unspecified: Secondary | ICD-10-CM | POA: Diagnosis not present

## 2020-01-20 DIAGNOSIS — L03113 Cellulitis of right upper limb: Secondary | ICD-10-CM | POA: Diagnosis not present

## 2020-01-20 DIAGNOSIS — R5381 Other malaise: Secondary | ICD-10-CM

## 2020-01-20 DIAGNOSIS — R4182 Altered mental status, unspecified: Secondary | ICD-10-CM | POA: Diagnosis not present

## 2020-01-20 LAB — SARS CORONAVIRUS 2 BY RT PCR (HOSPITAL ORDER, PERFORMED IN ~~LOC~~ HOSPITAL LAB): SARS Coronavirus 2: NEGATIVE

## 2020-01-20 MED ORDER — AMLODIPINE BESYLATE 5 MG PO TABS
5.0000 mg | ORAL_TABLET | Freq: Every day | ORAL | 1 refills | Status: DC
Start: 2020-01-20 — End: 2020-06-10

## 2020-01-20 MED ORDER — DOCUSATE SODIUM 100 MG PO CAPS: 100.0000 mg | ORAL_CAPSULE | Freq: Every day | ORAL | 2 refills | Status: AC | PRN

## 2020-01-20 MED ORDER — POLYETHYLENE GLYCOL 3350 17 GM/SCOOP PO POWD: 17.0000 g | Freq: Two times a day (BID) | ORAL | 0 refills | Status: AC | PRN

## 2020-01-20 MED ORDER — QUETIAPINE FUMARATE 25 MG PO TABS: 12.5000 mg | ORAL_TABLET | Freq: Every day | ORAL | Status: DC

## 2020-01-20 MED ORDER — QUETIAPINE FUMARATE 25 MG PO TABS
25.0000 mg | ORAL_TABLET | Freq: Every day | ORAL | 1 refills | Status: DC
Start: 2020-01-20 — End: 2020-06-10

## 2020-01-20 MED ORDER — QUETIAPINE FUMARATE 25 MG PO TABS: 25.0000 mg | ORAL_TABLET | Freq: Every day | ORAL | Status: DC

## 2020-01-20 MED ORDER — ACETAMINOPHEN 500 MG PO TABS: 500.0000 mg | ORAL_TABLET | Freq: Three times a day (TID) | ORAL | Status: AC

## 2020-01-20 NOTE — Care Management Important Message (Signed)
Important Message  Patient Details IM Letter given to the Patient. Name: Amy Whitehead MRN: [id number] Date of Birth: Mar 16, 1933   Medicare Important Message Given:  Yes     Caren Macadam 01/20/2020, 11:19 AM

## 2020-01-20 NOTE — Progress Notes (Signed)
Call attempt to SN[facility] to give report @ 1710. Will try again. PT[facility] transportation in place, awaiting for arrival. Val [place]

## 2020-01-20 NOTE — TOC Transition Note (Addendum)
Transition of Care [facility]) - CM/SW Discharge Note   Patient Details  Name: LYLIE BLACKLOCK MRN: [id number] Date of Birth: 05-03-1933  [facility]) CM/SW Contact:  Lanier Clam, RN Phone Number: 01/20/2020, 3:09 PM   Clinical Narrative:Received call from [name](dtr) she will call the facility once she is at home to sign her mother in. Received call from Welfare check police officer-informed him that dtr had just called back & will sign her mother into the facility.No further CM needs.    4p-Welfare check completed by police officer.Received call from dtr [facility] will contact the SNF [facility] to arrange signing patient into SNF.Going to rm #[id number],nsg call report tel#[phone]. Nsg to call [facility]. covid results are back-faxed to [phone]. No further CM needs.   Final next level of care: Skilled Nursing Facility Barriers to Discharge: No Barriers Identified   Patient Goals and CMS Choice Patient states their goals for this hospitalization and ongoing recovery are:: go to rehab CMS Medicare.gov Compare Post Acute Care list provided to:: Patient Represenative (must comment) (dtr [name])    Discharge Placement                       Discharge Plan and Services   Discharge Planning Services: CM Consult                                 Social Determinants of Health (SDOH) Interventions     Readmission Risk Interventions No flowsheet data found.

## 2020-01-20 NOTE — Progress Notes (Signed)
Physical Therapy Treatment Patient Details Name: Amy Whitehead MRN: [id number] DOB: [birth year] Today's Date: 01/20/2020    History of Present Illness 86 y.o. female with medical history significant for polyneuropathy who presented to [facility] ED due to confusion at home where she lives alone and independently.She reports that her dog scratched/bit her right hand a few days prior.  She has had pain and swelling in that area/cellulitis.    PT Comments    Pt received asleep in bed. Needed max A for encouragement to sit EOB to take medicine for present nurse. Once EOB, pt was encouraged to get up to ambulate to restroom. Mod A +2 (+2 for safety) hand held assist with sit<>stand for encouragement to power up. Mod A +2 hand held for safety to ambulate to restroom, performs controlled descent to toilet using handrail and +1. She has the strength and endurance to stand for increased amount of time, as demonstrated by her ability to maintain static standing using B UE's with handrail at toilet while therapist performs hygiene. She is Mod A +1 hand held assist to get to chair, requiring encouragement to initiate and head in right direction. She demo's good display of balance with hand held assist, just requires lots of cues and encouragement. No LOB, SOB, or c/o dizziness.  Follow Up Recommendations  SNF     Equipment Recommendations  None recommended by PT    Recommendations for Other Services       Precautions / Restrictions Precautions Precautions: Fall Restrictions Weight Bearing Restrictions: No    Mobility  Bed Mobility Overal bed mobility: Needs Assistance Bed Mobility: Supine to Sit     Supine to sit: Max assist     General bed mobility comments: Max A for encouragement to get EOB to take meds for nurse.  Transfers Overall transfer level: Needs assistance Equipment used: 2 person hand held assist;1 person hand held assist Transfers: Stand Pivot Transfers;Sit to/from Stand Sit to  Stand: Mod assist;+2 physical assistance Stand pivot transfers: Mod assist       General transfer comment: Mod A for encouragement. Pt doesn't lack the ability or strength, she requires encouragement to initiate, participate, and complete  Ambulation/Gait Ambulation/Gait assistance: Mod assist Gait Distance (Feet): 15 Feet Assistive device: 1 person hand held assist;2 person hand held assist Gait Pattern/deviations: Step-through pattern;Decreased stride length;Shuffle;Narrow base of support Gait velocity: decr   General Gait Details: Narrow BOS during gait, needs lots of encouragement to participate and go in the desired directions; easily distracted. +2 initially for safety, but only requires +1 hand held assist once pt is participating   Social research officer, government Rankin (Stroke Patients Only)       Balance                                            Cognition Arousal/Alertness: Awake/alert Behavior During Therapy: WFL for tasks assessed/performed Overall Cognitive Status: Within Functional Limits for tasks assessed                   Orientation Level: Person;Place;Time;Situation     Following Commands: Follows one step commands inconsistently Safety/Judgement: Decreased awareness of safety   Problem Solving: Decreased initiation;Requires verbal cues General Comments: Max A for encouragement to participate      Exercises  General Comments        Pertinent Vitals/Pain Pain Assessment: Faces Faces Pain Scale: Hurts little more Pain Location: B feet Pain Descriptors / Indicators: Moaning;Discomfort Pain Intervention(s): Monitored during session (donned pt's bedroom shoes to increase comfort during ambulation)    Home Living                      Prior Function            PT Goals (current goals can now be found in the care plan section) Acute Rehab PT Goals Patient Stated Goal: to go  home to her dog Molly PT Goal Formulation: With patient/family Time For Goal Achievement: 01/26/20 Potential to Achieve Goals: Good Progress towards PT goals: Progressing toward goals    Frequency    Min 3X/week      PT Plan Current plan remains appropriate    Co-evaluation              AM-PAC PT "6 Clicks" Mobility   Outcome Measure  Help needed turning from your back to your side while in a flat bed without using bedrails?: None Help needed moving from lying on your back to sitting on the side of a flat bed without using bedrails?: None Help needed moving to and from a bed to a chair (including a wheelchair)?: A Little Help needed standing up from a chair using your arms (e.g., wheelchair or bedside chair)?: A Little Help needed to walk in hospital room?: A Little Help needed climbing 3-5 steps with a railing? : A Little 6 Click Score: 20    End of Session Equipment Utilized During Treatment: Gait belt Activity Tolerance: Patient limited by fatigue;Patient limited by pain (pt c/o B foot pain) Patient left: in chair;with call bell/phone within reach;with chair alarm set Nurse Communication: Mobility status PT Visit Diagnosis: Muscle weakness (generalized) (M62.81);Difficulty in walking, not elsewhere classified (R26.2)     Time:  -     Charges:                        Ellwood Handler, SPTA [facility] [phone]

## 2020-01-20 NOTE — Progress Notes (Signed)
PROGRESS NOTE  Amy Whitehead BJY:[id number] DOB: 05/25/1933   PCP: Amy Whitehead, Janece, FNP  Patient is from: Home.  Lives alone.  Independent for ADLs at baseline.  DOA: 01/11/2020 LOS: 9  Chief complaints: Altered mental status  Brief Narrative / Interim history: 86 year old female with history of polyneuropathy on gabapentin brought to ED by EMS due to acute confusion at home.  Reportedly had right hand erythema, edema, warmth and tenderness after scratched by her dog few days prior to presentation.   In ED, afebrile.  No leukocytosis.  Started on Unasyn.  CT head without acute finding.  Cultures obtained.  She was admitted for acute metabolic encephalopathy due to right hand cellulitis.  Urine culture negative.  Hospital course complicated by delirium that seems to have resolved.  Therapy recommended SNF.  Waiting on insurance authorization.  Medically stable and ready for discharge.   Subjective: Seen and examined earlier this morning.  No major events overnight of this morning.  She is a sleepy but awakes to voice.  No complaints but not a great historian.  Only oriented to self and place.  Objective: Vitals:   01/19/20 0409 01/19/20 1303 01/19/20 1959 01/20/20 0517  BP: 126/70 (!) 132/51 (!) 141/68 132/74  Pulse: 66 63 60 72  Resp: 16 16 18 18   Temp: 98.1 F (36.7 C) 98.3 F (36.8 C) 98.3 F (36.8 C) 97.9 F (36.6 C)  TempSrc:   Oral Oral  SpO2: 96% 98% 100% 95%  Weight:      Height:        Intake/Output Summary (Last 24 hours) at 01/20/2020 1226 Last data filed at 01/20/2020 0540 Gross per 24 hour  Intake 240 ml  Output 250 ml  Net -10 ml   Filed Weights   01/11/20 2006 01/11/20 2047  Weight: 56.7 kg 51.3 kg    Examination:  GENERAL: No apparent distress.  Nontoxic. HEENT: MMM.  Vision and hearing grossly intact.  NECK: Supple.  No apparent JVD.  RESP: On room air.  No IWOB.  Fair aeration bilaterally. CVS:  RRR. Heart sounds normal.  ABD/GI/GU: BS+. Abd  soft, NTND.  MSK/EXT:  Moves extremities. No apparent deformity. No edema.  SKIN: no apparent skin lesion or wound NEURO: Sleepy but wakes to voice.  Oriented to self and place.  No apparent focal neuro deficit. PSYCH: Calm. Normal affect.  Procedures:  None  Microbiology summarized: COVID-19, influenza and RSV PCR nonreactive. Urine cultures NGTD.  Assessment & Plan: Acute encephalopathy-multifactorial including metabolic encephalopathy from right hand cellulitis and delirium with possible underlying cognitive impairment/dementia.  Work-up including CT head, urine culture, EtOH level unrevealing.  Completed antibiotic course for right hand cellulitis.  No focal neuro deficit to suggest CVA.  Oriented to self and place today. -Reorientation, fall and delirium precautions.  -Reduce Seroquel to 12.5 mg nightly -IV Haldol as needed-did not require.  Right hand cellulitis,likely fromherdog scratch: No leukocytosis or fever.  Blood cultures NGTD. MRSA screening test negative.  -Received antibiotics 11/14 through 11/17  Acute urinary retention: Resolved. -Monitor urine output  Elevated BP: Normotensive. -Continue amlodipine 5 mg daily  Polyneuropathy -Continue home gabapentin  Ambulatory dysfunction -Therapy recommended SNF.   Body mass index is 17.71 kg/m.         DVT prophylaxis:  enoxaparin (LOVENOX) injection 40 mg Start: 01/11/20 2200  Code Status: Full code Family Communication: Patient and/or RN. Available if any question.  Status is: Inpatient  Remains inpatient appropriate because:Unsafe d/c plan.  Medically stable  for discharge.   Dispo:  Patient From: Home  Planned Disposition: Skilled Nursing Facility  Expected discharge date: 01/21/20  Medically stable for discharge: No        Consultants:  None   Sch Meds:  Scheduled Meds: . amLODipine  5 mg Oral Daily  . aspirin EC  81 mg Oral Daily  . enoxaparin (LOVENOX) injection  40 mg  Subcutaneous Q24H  . gabapentin  200 mg Oral BID  . multivitamin with minerals  1 tablet Oral Daily  . QUEtiapine  12.5 mg Oral QHS  . saccharomyces boulardii  250 mg Oral BID   Continuous Infusions: PRN Meds:.acetaminophen, haloperidol **OR** haloperidol lactate, hydrALAZINE, ondansetron (ZOFRAN) IV  Antimicrobials: Anti-infectives (From admission, onward)   Start     Dose/Rate Route Frequency Ordered Stop   01/14/20 1800  amoxicillin-clavulanate (AUGMENTIN) 875-125 MG per tablet 1 tablet        1 tablet Oral Every 12 hours 01/14/20 1331 01/17/20 1801   01/12/20 2000  vancomycin (VANCOREADY) IVPB 750 mg/150 mL  Status:  Discontinued        750 mg 150 mL/hr over 60 Minutes Intravenous Every 24 hours 01/11/20 1940 01/12/20 0545   01/11/20 2200  Ampicillin-Sulbactam (UNASYN) 3 g in sodium chloride 0.9 % 100 mL IVPB  Status:  Discontinued        3 g 200 mL/hr over 30 Minutes Intravenous Every 6 hours 01/11/20 1901 01/14/20 1331   01/11/20 2000  vancomycin (VANCOCIN) IVPB 1000 mg/200 mL premix        1,000 mg 200 mL/hr over 60 Minutes Intravenous  Once 01/11/20 1940 01/11/20 2233   01/11/20 1515  Ampicillin-Sulbactam (UNASYN) 3 g in sodium chloride 0.9 % 100 mL IVPB        3 g 200 mL/hr over 30 Minutes Intravenous  Once 01/11/20 1457 01/11/20 1630   01/11/20 1445  vancomycin (VANCOCIN) IVPB 1000 mg/200 mL premix  Status:  Discontinued        1,000 mg 200 mL/hr over 60 Minutes Intravenous  Once 01/11/20 1438 01/11/20 1448   01/11/20 1445  cefTRIAXone (ROCEPHIN) 2 g in sodium chloride 0.9 % 100 mL IVPB  Status:  Discontinued        2 g 200 mL/hr over 30 Minutes Intravenous  Once 01/11/20 1438 01/11/20 1454       I have personally reviewed the following labs and images: CBC: Recent Labs  Lab 01/14/20 0558 01/15/20 0523 01/16/20 0632 01/19/20 0537  WBC 5.6 6.0 6.9 5.9  HGB 14.5 15.5* 14.6 14.7  HCT 44.0 47.2* 45.1 45.1  MCV 97.8 98.7 98.0 98.5  PLT 215 267 269 288   BMP  &GFR Recent Labs  Lab 01/14/20 0558 01/15/20 0523 01/16/20 0632 01/19/20 0537  NA 138 139 141 139  K 4.0 3.6 4.3 4.1  CL 108 103 106 104  CO2 23 24 29 27   GLUCOSE 66* 89 98 96  BUN 13 13 15 15   CREATININE 0.71 0.65 0.73 0.70  CALCIUM 8.6* 8.7* 8.8* 8.7*  MG  --   --   --  2.2  PHOS  --   --   --  3.0   Estimated Creatinine Clearance: 42.4 mL/min (by C-G formula based on SCr of 0.7 mg/dL). Liver & Pancreas: Recent Labs  Lab 01/15/20 0523 01/16/20 0632 01/19/20 0537  AST 31 26  --   ALT 23 21  --   ALKPHOS 62 58  --   BILITOT 0.6 0.4  --  PROT 6.9 5.9*  --   ALBUMIN 3.5 3.1* 3.0*   No results for input(s): LIPASE, AMYLASE in the last 168 hours. No results for input(s): AMMONIA in the last 168 hours. Diabetic: No results for input(s): HGBA1C in the last 72 hours. No results for input(s): GLUCAP in the last 168 hours. Cardiac Enzymes: No results for input(s): CKTOTAL, CKMB, CKMBINDEX, TROPONINI in the last 168 hours. No results for input(s): PROBNP in the last 8760 hours. Coagulation Profile: No results for input(s): INR, PROTIME in the last 168 hours. Thyroid Function Tests: No results for input(s): TSH, T4TOTAL, FREET4, T3FREE, THYROIDAB in the last 72 hours. Lipid Profile: No results for input(s): CHOL, HDL, LDLCALC, TRIG, CHOLHDL, LDLDIRECT in the last 72 hours. Anemia Panel: No results for input(s): VITAMINB12, FOLATE, FERRITIN, TIBC, IRON, RETICCTPCT in the last 72 hours. Urine analysis:    Component Value Date/Time   COLORURINE YELLOW 01/11/2020 1942   APPEARANCEUR CLEAR 01/11/2020 1942   LABSPEC 1.025 01/11/2020 1942   PHURINE 5.0 01/11/2020 1942   GLUCOSEU NEGATIVE 01/11/2020 1942   HGBUR NEGATIVE 01/11/2020 1942   BILIRUBINUR NEGATIVE 01/11/2020 1942   BILIRUBINUR NEG 03/11/2015 1503   KETONESUR 20 (A) 01/11/2020 1942   PROTEINUR 30 (A) 01/11/2020 1942   UROBILINOGEN 0.2 03/11/2015 1503   NITRITE NEGATIVE 01/11/2020 1942   LEUKOCYTESUR NEGATIVE  01/11/2020 1942   Sepsis Labs: Invalid input(s): PROCALCITONIN, LACTICIDVEN  Microbiology: Recent Results (from the past 240 hour(s))  Blood Culture (routine x 2)     Status: None   Collection Time: 01/11/20  3:30 PM   Specimen: BLOOD  Result Value Ref Range Status   Specimen Description   Final    BLOOD LEFT ANTECUBITAL Performed at [facility], [address], [place], [place] [address]    Special Requests   Final    BOTTLES DRAWN AEROBIC AND ANAEROBIC Blood Culture results may not be optimal due to an excessive volume of blood received in culture bottles Performed at [facility], [address], [place], [place] [address]    Culture   Final    NO GROWTH 5 DAYS Performed at [facility], [address], [place], [place] [address]    Report Status 01/16/2020 FINAL  Final  Respiratory Panel by RT PCR (Flu A&B, Covid) - Nasopharyngeal Swab     Status: None   Collection Time: 01/11/20  3:37 PM   Specimen: Nasopharyngeal Swab  Result Value Ref Range Status   SARS Coronavirus 2 by RT PCR NEGATIVE NEGATIVE Final    Comment: (NOTE) SARS-CoV-2 target nucleic acids are NOT DETECTED.  The SARS-CoV-2 RNA is generally detectable in upper respiratoy specimens during the acute phase of infection. The lowest concentration of SARS-CoV-2 viral copies this assay can detect is 131 copies/mL. A negative result does not preclude SARS-Cov-2 infection and should not be used as the sole basis for treatment or other patient management decisions. A negative result may occur with  improper specimen collection/handling, submission of specimen other than nasopharyngeal swab, presence of viral mutation(s) within the areas targeted by this assay, and inadequate number of viral copies (<131 copies/mL). A negative result must be combined with clinical observations, patient history, and epidemiological information. The expected result is Negative.  Fact Sheet  for Patients:  https://www.Swaminathan.com/  Fact Sheet for Healthcare Providers:  https://www.young.biz/  This test is no t yet approved or cleared by the [place] FDA and  has been authorized for detection and/or diagnosis of  SARS-CoV-2 by FDA under an Emergency Use Authorization (EUA). This EUA will remain  in effect (meaning this test can be used) for the duration of the COVID-19 declaration under Section 564(b)(1) of the Act, 21 U.S.C. section 360bbb-3(b)(1), unless the authorization is terminated or revoked sooner.     Influenza A by PCR NEGATIVE NEGATIVE Final   Influenza B by PCR NEGATIVE NEGATIVE Final    Comment: (NOTE) The Xpert Xpress SARS-CoV-2/FLU/RSV assay is intended as an aid in  the diagnosis of influenza from Nasopharyngeal swab specimens and  should not be used as a sole basis for treatment. Nasal washings and  aspirates are unacceptable for Xpert Xpress SARS-CoV-2/FLU/RSV  testing.  Fact Sheet for Patients: https://www.Emmick.com/  Fact Sheet for Healthcare Providers: https://www.young.biz/  This test is not yet approved or cleared by the [place] FDA and  has been authorized for detection and/or diagnosis of SARS-CoV-2 by  FDA under an Emergency Use Authorization (EUA). This EUA will remain  in effect (meaning this test can be used) for the duration of the  Covid-19 declaration under Section 564(b)(1) of the Act, 21  U.S.C. section 360bbb-3(b)(1), unless the authorization is  terminated or revoked. Performed at [facility], [address] [place], [place] [address]   Blood Culture (routine x 2)     Status: None   Collection Time: 01/11/20  3:43 PM   Specimen: BLOOD  Result Value Ref Range Status   Specimen Description   Final    BLOOD RIGHT ANTECUBITAL Performed at [facility], [address] [place], [place] [address]     Special Requests   Final    BOTTLES DRAWN AEROBIC AND ANAEROBIC Blood Culture adequate volume Performed at [facility], [address], [place], [place] [address]    Culture   Final    NO GROWTH 5 DAYS Performed at [facility], [address] [place], [place] [address]    Report Status 01/16/2020 FINAL  Final  MRSA PCR Screening     Status: None   Collection Time: 01/11/20  5:27 PM   Specimen: Nasopharyngeal  Result Value Ref Range Status   MRSA by PCR NEGATIVE NEGATIVE Final    Comment:        The GeneXpert MRSA Assay (FDA approved for NASAL specimens only), is one component of a comprehensive MRSA colonization surveillance program. It is not intended to diagnose MRSA infection nor to guide or monitor treatment for MRSA infections. Performed at [facility], [address], [place], [place] [address]   Urine culture     Status: None   Collection Time: 01/11/20  7:42 PM   Specimen: Urine, Catheterized  Result Value Ref Range Status   Specimen Description   Final    URINE, CATHETERIZED Performed at [facility], [address], [place] , [place] [address]    Special Requests   Final    NONE Performed at [facility], [address], [place], [place] [address]    Culture   Final    NO GROWTH Performed at [facility], [address] [place], [place] [address]    Report Status 01/13/2020 FINAL  Final  SARS Coronavirus 2 by RT PCR (hospital order, performed in [facility] hospital lab) Nasopharyngeal Nasopharyngeal Swab     Status: None   Collection Time: 01/16/20  2:15 PM   Specimen: Nasopharyngeal Swab  Result Value Ref Range Status   SARS  Coronavirus 2 NEGATIVE NEGATIVE Final    Comment: (NOTE) SARS-CoV-2 target nucleic acids are NOT DETECTED.  The SARS-CoV-2 RNA is generally detectable in upper and lower respiratory specimens during the acute phase of infection. The  lowest concentration of SARS-CoV-2 viral copies this assay can detect is 250 copies / mL. A negative result does not preclude SARS-CoV-2 infection and should not be used as the sole basis for treatment or other patient management decisions.  A negative result may occur with improper specimen collection / handling, submission of specimen other than nasopharyngeal swab, presence of viral mutation(s) within the areas targeted by this assay, and inadequate number of viral copies (<250 copies / mL). A negative result must be combined with clinical observations, patient history, and epidemiological information.  Fact Sheet for Patients:   BoilerBrush.com.cy  Fact Sheet for Healthcare Providers: https://pope.com/  This test is not yet approved or  cleared by the [place] FDA and has been authorized for detection and/or diagnosis of SARS-CoV-2 by FDA under an Emergency Use Authorization (EUA).  This EUA will remain in effect (meaning this test can be used) for the duration of the COVID-19 declaration under Section 564(b)(1) of the Act, 21 U.S.C. section 360bbb-3(b)(1), unless the authorization is terminated or revoked sooner.  Performed at [facility], [address] [place], [place] [address]     Radiology Studies: No results found.     Iker Nuttall T. Tildon Silveria [facility]  If 7PM-7AM, please contact night-coverage [email or web address] 01/20/2020, 12:26 PM

## 2020-01-20 NOTE — Discharge Summary (Signed)
Physician Discharge Summary  Amy Whitehead:[id number] DOB: [birth year] DOA: 01/11/2020  PCP: Arnette Felts, FNP  Admit date: 01/11/2020 Discharge date: 01/20/2020  Admitted From: Home Disposition: SNF  Recommendations for Outpatient Follow-up:  1. Follow ups as below. 2. Please obtain CBC/BMP/Mag at follow up 3. Please follow up on the following pending results: None   Discharge Condition: Stable CODE STATUS: Full code   Contact information for after-discharge care    Destination    [facility] Preferred SNF .   Service: Skilled Nursing Contact information: [address] [place] [address] [phone]                  Hospital Course: 86 year old female with history of polyneuropathy on gabapentin brought to ED by EMS due to acute confusion at home.  Reportedly had right hand erythema, edema, warmth and tenderness after scratched by her dog few days prior to presentation.   In ED, afebrile.  No leukocytosis.  Started on Unasyn.  CT head without acute finding.  Cultures obtained.  She was admitted for acute metabolic encephalopathy due to right hand cellulitis.  She completed antibiotic course for cellulitis.  Urine culture negative.  Hospital course complicated by delirium and acute urinary retention.  Started on low-dose Seroquel at night.  Delirium seems to have resolved. Urine retention resolved.  Therapy recommended SNF.   See individual problem list below for more hospital course.  Discharge Diagnoses:  Acute encephalopathy-multifactorial including metabolic encephalopathy from right hand cellulitis and delirium with possible underlying cognitive impairment/dementia.  Work-up including CT head, urine culture, EtOH level unrevealing.  Completed antibiotic course for right hand cellulitis.  No focal neuro deficit to suggest CVA.  Oriented to self and place today. -Reorientation, fall and delirium precautions.  -Continue  Seroquel 25 mg at night  Right hand cellulitis,likely fromherdog scratch: No leukocytosis or fever.  Blood cultures NGTD. MRSA screening test negative.  Cellulitis resolved. -Received antibiotics 11/14 through 11/17  Acute urinary retention: Resolved.  Elevated BP: Normotensive. -Continue amlodipine 5 mg daily  Polyneuropathy: Stable. -Continue home gabapentin  Ambulatory dysfunction -Continue physical therapy at SNF.  Family communication: Multiple attempts to reach patient's daughter, [name] not successful.  Body mass index is 17.71 kg/m.            Discharge Exam: Vitals:   01/20/20 0517 01/20/20 1228  BP: 132/74 114/66  Pulse: 72 66  Resp: 18 18  Temp: 97.9 F (36.6 C) 97.8 F (36.6 C)  SpO2: 95% 100%    GENERAL: No apparent distress.  Nontoxic. HEENT: MMM.  Vision and hearing grossly intact.  NECK: Supple.  No apparent JVD.  RESP: On room air.  No IWOB.  Fair aeration bilaterally. CVS:  RRR. Heart sounds normal.  ABD/GI/GU: Bowel sounds present. Soft. Non tender.  MSK/EXT:  Moves extremities. No apparent deformity. No edema.  SKIN: no apparent skin lesion or wound NEURO: Awake and alert.  Oriented to self place and month.  No apparent focal neuro deficit. PSYCH: Calm. Normal affect.  Discharge Instructions  Discharge Instructions    Diet general   Complete by: As directed    Increase activity slowly   Complete by: As directed      Allergies as of 01/20/2020   No Known Allergies     Medication List    STOP taking these medications   ALEVE PO     TAKE these medications   acetaminophen 500 MG tablet Commonly known as: TYLENOL Take 1  tablet (500 mg total) by mouth every 8 (eight) hours.   amLODipine 5 MG tablet Commonly known as: NORVASC Take 1 tablet (5 mg total) by mouth daily.   aspirin EC 81 MG tablet Take 81 mg by mouth daily.   CALTRATE 600 PLUS-VIT D PO Take 2 tablets by mouth 2 (two) times daily.   docusate sodium  100 MG capsule Commonly known as: Colace Take 1 capsule (100 mg total) by mouth daily as needed for mild constipation.   gabapentin 300 MG capsule Commonly known as: NEURONTIN TAKE 3 CAPSULES BY MOUTH THREE TIMES DAILY   multivitamin with minerals Tabs tablet Take 1 tablet by mouth daily.   polyethylene glycol powder 17 GM/SCOOP powder Commonly known as: MiraLax Take 17 g by mouth 2 (two) times daily as needed for moderate constipation.   QUEtiapine 25 MG tablet Commonly known as: SEROQUEL Take 1 tablet (25 mg total) by mouth at bedtime.       Consultations:  None  Procedures/Studies:  None   DG Forearm Right  Result Date: 01/11/2020 CLINICAL DATA:  Dog bite EXAM: RIGHT HAND - COMPLETE 3+ VIEW; RIGHT WRIST - COMPLETE 3+ VIEW; RIGHT FOREARM - 2 VIEW COMPARISON:  None. FINDINGS: Osteopenia. No acute fracture or dislocation. There are advanced degenerative changes at the first [facility]. There are severe degenerative changes of the second and third MCPs. Advanced degenerative changes throughout the DIPs and fifth PIP. No area of erosion or osseous destruction. No unexpected radiopaque foreign body. Soft tissue edema of the hand. Soft tissues are otherwise unremarkable. IMPRESSION: 1. No acute fracture or dislocation. 2. Soft tissue edema of the hand. No unexpected radiopaque foreign body. 3. Advanced degenerative changes of the right hand, as described. Electronically Signed   By: Meda Klinefelter MD   On: 01/11/2020 16:45   DG Wrist Complete Right  Result Date: 01/11/2020 CLINICAL DATA:  Dog bite EXAM: RIGHT HAND - COMPLETE 3+ VIEW; RIGHT WRIST - COMPLETE 3+ VIEW; RIGHT FOREARM - 2 VIEW COMPARISON:  None. FINDINGS: Osteopenia. No acute fracture or dislocation. There are advanced degenerative changes at the first [facility]. There are severe degenerative changes of the second and third MCPs. Advanced degenerative changes throughout the DIPs and fifth PIP. No area of erosion or osseous  destruction. No unexpected radiopaque foreign body. Soft tissue edema of the hand. Soft tissues are otherwise unremarkable. IMPRESSION: 1. No acute fracture or dislocation. 2. Soft tissue edema of the hand. No unexpected radiopaque foreign body. 3. Advanced degenerative changes of the right hand, as described. Electronically Signed   By: Meda Klinefelter MD   On: 01/11/2020 16:45   CT Head Wo Contrast  Result Date: 01/11/2020 CLINICAL DATA:  Altered mental status.  Recent dog bite. EXAM: CT HEAD WITHOUT CONTRAST TECHNIQUE: Contiguous axial images were obtained from the base of the skull through the vertex without intravenous contrast. COMPARISON:  None. FINDINGS: Brain: There is no evidence of an acute infarct, intracranial hemorrhage, mass, midline shift, or extra-axial fluid collection. Cerebral atrophy is within normal limits for age. Vascular: Calcified atherosclerosis at the skull base. No hyperdense vessel. Skull: No fracture or suspicious osseous lesion. Sinuses/Orbits: Visualized paranasal sinuses and mastoid air cells are clear. No acute finding in the visualized portions of the orbits. Other: None. IMPRESSION: Unremarkable CT appearance of the brain for age. Electronically Signed   By: Sebastian Ache M.D.   On: 01/11/2020 15:17   DG Chest Port 1 View  Result Date: 01/11/2020 CLINICAL DATA:  Sepsis  EXAM: PORTABLE CHEST 1 VIEW COMPARISON:  None. FINDINGS: The cardiomediastinal silhouette is mildly enlarged. Likely hiatal hernia. LEFT basilar atelectasis. On no focal consolidation. No pleural effusion or pneumothorax. Degenerative changes of the thoracic spine. IMPRESSION: 1. LEFT basilar atelectasis. No focal consolidation to suggest pneumonia. 2. Likely hiatal hernia. Electronically Signed   By: Meda Klinefelter MD   On: 01/11/2020 16:46   DG Hand Complete Right  Result Date: 01/11/2020 CLINICAL DATA:  Dog bite EXAM: RIGHT HAND - COMPLETE 3+ VIEW; RIGHT WRIST - COMPLETE 3+ VIEW; RIGHT  FOREARM - 2 VIEW COMPARISON:  None. FINDINGS: Osteopenia. No acute fracture or dislocation. There are advanced degenerative changes at the first [facility]. There are severe degenerative changes of the second and third MCPs. Advanced degenerative changes throughout the DIPs and fifth PIP. No area of erosion or osseous destruction. No unexpected radiopaque foreign body. Soft tissue edema of the hand. Soft tissues are otherwise unremarkable. IMPRESSION: 1. No acute fracture or dislocation. 2. Soft tissue edema of the hand. No unexpected radiopaque foreign body. 3. Advanced degenerative changes of the right hand, as described. Electronically Signed   By: Meda Klinefelter MD   On: 01/11/2020 16:45       The results of significant diagnostics from this hospitalization (including imaging, microbiology, ancillary and laboratory) are listed below for reference.     Microbiology: Recent Results (from the past 240 hour(s))  Blood Culture (routine x 2)     Status: None   Collection Time: 01/11/20  3:30 PM   Specimen: BLOOD  Result Value Ref Range Status   Specimen Description   Final    BLOOD LEFT ANTECUBITAL Performed at [facility], [address] [place], [place] [address]    Special Requests   Final    BOTTLES DRAWN AEROBIC AND ANAEROBIC Blood Culture results may not be optimal due to an excessive volume of blood received in culture bottles Performed at [facility], [address], [place], [place] [address]    Culture   Final    NO GROWTH 5 DAYS Performed at [facility], [address], [place], [place] [address]    Report Status 01/16/2020 FINAL  Final  Respiratory Panel by RT PCR (Flu A&B, Covid) - Nasopharyngeal Swab     Status: None   Collection Time: 01/11/20  3:37 PM   Specimen: Nasopharyngeal Swab  Result Value Ref Range Status   SARS Coronavirus 2 by RT PCR NEGATIVE NEGATIVE Final    Comment: (NOTE) SARS-CoV-2 target nucleic acids are NOT  DETECTED.  The SARS-CoV-2 RNA is generally detectable in upper respiratoy specimens during the acute phase of infection. The lowest concentration of SARS-CoV-2 viral copies this assay can detect is 131 copies/mL. A negative result does not preclude SARS-Cov-2 infection and should not be used as the sole basis for treatment or other patient management decisions. A negative result may occur with  improper specimen collection/handling, submission of specimen other than nasopharyngeal swab, presence of viral mutation(s) within the areas targeted by this assay, and inadequate number of viral copies (<131 copies/mL). A negative result must be combined with clinical observations, patient history, and epidemiological information. The expected result is Negative.  Fact Sheet for Patients:  https://www.Curfman.com/  Fact Sheet for Healthcare Providers:  https://www.young.biz/  This test is no t yet approved or cleared by the [place] FDA and  has been authorized for detection and/or diagnosis of SARS-CoV-2 by FDA under an Emergency Use Authorization (EUA).  This EUA will remain  in effect (meaning this test can be used) for the duration of the COVID-19 declaration under Section 564(b)(1) of the Act, 21 U.S.C. section 360bbb-3(b)(1), unless the authorization is terminated or revoked sooner.     Influenza A by PCR NEGATIVE NEGATIVE Final   Influenza B by PCR NEGATIVE NEGATIVE Final    Comment: (NOTE) The Xpert Xpress SARS-CoV-2/FLU/RSV assay is intended as an aid in  the diagnosis of influenza from Nasopharyngeal swab specimens and  should not be used as a sole basis for treatment. Nasal washings and  aspirates are unacceptable for Xpert Xpress SARS-CoV-2/FLU/RSV  testing.  Fact Sheet for Patients: https://www.Bourdeau.com/  Fact Sheet for Healthcare Providers: https://www.young.biz/  This test is not yet  approved or cleared by the [place] FDA and  has been authorized for detection and/or diagnosis of SARS-CoV-2 by  FDA under an Emergency Use Authorization (EUA). This EUA will remain  in effect (meaning this test can be used) for the duration of the  Covid-19 declaration under Section 564(b)(1) of the Act, 21  U.S.C. section 360bbb-3(b)(1), unless the authorization is  terminated or revoked. Performed at [facility], [address], [place], [place] [address]   Blood Culture (routine x 2)     Status: None   Collection Time: 01/11/20  3:43 PM   Specimen: BLOOD  Result Value Ref Range Status   Specimen Description   Final    BLOOD RIGHT ANTECUBITAL Performed at [facility], [address], [place], [place] [address]    Special Requests   Final    BOTTLES DRAWN AEROBIC AND ANAEROBIC Blood Culture adequate volume Performed at [facility], [address], [place], [place] [address]    Culture   Final    NO GROWTH 5 DAYS Performed at [facility], [address], [place], [place] [address]    Report Status 01/16/2020 FINAL  Final  MRSA PCR Screening     Status: None   Collection Time: 01/11/20  5:27 PM   Specimen: Nasopharyngeal  Result Value Ref Range Status   MRSA by PCR NEGATIVE NEGATIVE Final    Comment:        The GeneXpert MRSA Assay (FDA approved for NASAL specimens only), is one component of a comprehensive MRSA colonization surveillance program. It is not intended to diagnose MRSA infection nor to guide or monitor treatment for MRSA infections. Performed at [facility], [address], [place], [place] [address]   Urine culture     Status: None   Collection Time: 01/11/20  7:42 PM   Specimen: Urine, Catheterized  Result Value Ref Range Status   Specimen Description   Final    URINE, CATHETERIZED Performed at [facility], [address], [place],  [place] [address]    Special Requests   Final    NONE Performed at [facility], [address], [place], [place] [address]    Culture   Final    NO GROWTH Performed at [facility], [address] [place], [place] [address]    Report Status 01/13/2020 FINAL  Final  SARS Coronavirus 2 by RT PCR (hospital order, performed in [facility] hospital lab) Nasopharyngeal Nasopharyngeal Swab     Status: None   Collection Time: 01/16/20  2:15 PM   Specimen: Nasopharyngeal Swab  Result Value Ref Range Status   SARS Coronavirus 2 NEGATIVE NEGATIVE Final  Comment: (NOTE) SARS-CoV-2 target nucleic acids are NOT DETECTED.  The SARS-CoV-2 RNA is generally detectable in upper and lower respiratory specimens during the acute phase of infection. The lowest concentration of SARS-CoV-2 viral copies this assay can detect is 250 copies / mL. A negative result does not preclude SARS-CoV-2 infection and should not be used as the sole basis for treatment or other patient management decisions.  A negative result may occur with improper specimen collection / handling, submission of specimen other than nasopharyngeal swab, presence of viral mutation(s) within the areas targeted by this assay, and inadequate number of viral copies (<250 copies / mL). A negative result must be combined with clinical observations, patient history, and epidemiological information.  Fact Sheet for Patients:   BoilerBrush.com.cyhttps://www.fda.gov/media/136312/download  Fact Sheet for Healthcare Providers: https://pope.com/https://www.fda.gov/media/136313/download  This test is not yet approved or  cleared by the [place] FDA and has been authorized for detection and/or diagnosis of SARS-CoV-2 by FDA under an Emergency Use Authorization (EUA).  This EUA will remain in effect (meaning this test can be used) for the duration of the COVID-19 declaration under Section 564(b)(1) of the Act, 21 U.S.C. section 360bbb-3(b)(1), unless the  authorization is terminated or revoked sooner.  Performed at [facility], [address] [place], [place] [id number]      Labs: BNP (last 3 results) No results for input(s): BNP in the last 8760 hours. Basic Metabolic Panel: Recent Labs  Lab 01/14/20 0558 01/15/20 0523 01/16/20 0632 01/19/20 0537  NA 138 139 141 139  K 4.0 3.6 4.3 4.1  CL 108 103 106 104  CO2 23 24 29 27   GLUCOSE 66* 89 98 96  BUN 13 13 15 15   CREATININE 0.71 0.65 0.73 0.70  CALCIUM 8.6* 8.7* 8.8* 8.7*  MG  --   --   --  2.2  PHOS  --   --   --  3.0   Liver Function Tests: Recent Labs  Lab 01/15/20 0523 01/16/20 0632 01/19/20 0537  AST 31 26  --   ALT 23 21  --   ALKPHOS 62 58  --   BILITOT 0.6 0.4  --   PROT 6.9 5.9*  --   ALBUMIN 3.5 3.1* 3.0*   No results for input(s): LIPASE, AMYLASE in the last 168 hours. No results for input(s): AMMONIA in the last 168 hours. CBC: Recent Labs  Lab 01/14/20 0558 01/15/20 0523 01/16/20 0632 01/19/20 0537  WBC 5.6 6.0 6.9 5.9  HGB 14.5 15.5* 14.6 14.7  HCT 44.0 47.2* 45.1 45.1  MCV 97.8 98.7 98.0 98.5  PLT 215 267 269 288   Cardiac Enzymes: No results for input(s): CKTOTAL, CKMB, CKMBINDEX, TROPONINI in the last 168 hours. BNP: Invalid input(s): POCBNP CBG: No results for input(s): GLUCAP in the last 168 hours. D-Dimer No results for input(s): DDIMER in the last 72 hours. Hgb A1c No results for input(s): HGBA1C in the last 72 hours. Lipid Profile No results for input(s): CHOL, HDL, LDLCALC, TRIG, CHOLHDL, LDLDIRECT in the last 72 hours. Thyroid function studies No results for input(s): TSH, T4TOTAL, T3FREE, THYROIDAB in the last 72 hours.  Invalid input(s): FREET3 Anemia work up No results for input(s): VITAMINB12, FOLATE, FERRITIN, TIBC, IRON, RETICCTPCT in the last 72 hours. Urinalysis    Component Value Date/Time   COLORURINE YELLOW 01/11/2020 1942   APPEARANCEUR CLEAR 01/11/2020 1942   LABSPEC 1.025 01/11/2020  1942   PHURINE 5.0 01/11/2020 1942   GLUCOSEU NEGATIVE 01/11/2020 1942  HGBUR NEGATIVE 01/11/2020 1942   BILIRUBINUR NEGATIVE 01/11/2020 1942   BILIRUBINUR NEG 03/11/2015 1503   KETONESUR 20 (A) 01/11/2020 1942   PROTEINUR 30 (A) 01/11/2020 1942   UROBILINOGEN 0.2 03/11/2015 1503   NITRITE NEGATIVE 01/11/2020 1942   LEUKOCYTESUR NEGATIVE 01/11/2020 1942   Sepsis Labs Invalid input(s): PROCALCITONIN,  WBC,  LACTICIDVEN   Time coordinating discharge: 35 minutes  SIGNED:  Almon Hercules, MD  [facility] 01/20/2020, 1:25 PM  If 7PM-7AM, please contact night-coverage [email or web address]

## 2020-01-20 NOTE — Progress Notes (Signed)
Report given to [facility] RN at [facility]. Pt awaiting for [facility] arrival for transportation. Val Eagle

## 2020-01-20 NOTE — Progress Notes (Signed)
Occupational Therapy Treatment Patient Details Name: Amy Whitehead MRN: [id number] DOB: 08-Nov-1933 Today's Date: 01/20/2020    History of present illness 86 y.o. female with medical history significant for polyneuropathy who presented to [facility] ED due to confusion at home where she lives alone and independently.She reports that her dog scratched/bit her right hand a few days prior.  She has had pain and swelling in that area/cellulitis.   OT comments  Pt. Is not motivated and requires encouragement to participate with ADLs and mobility. Pt. Was able to participate with ADLs while sitting in chair. Pt. Requires encouragement and Mod A with sit to stand from chair to clean peri area. Pt. To be followed by skilled OT while in hospital.   Follow Up Recommendations  SNF;Supervision/Assistance - 24 hour    Equipment Recommendations  Other (comment)    Recommendations for Other Services      Precautions / Restrictions Precautions Precautions: Fall Restrictions Weight Bearing Restrictions: No       Mobility Bed Mobility Overal bed mobility: Needs Assistance Bed Mobility: Supine to Sit     Supine to sit: Max assist     General bed mobility comments: Max A for encouragement to get EOB to take meds for nurse.  Transfers Overall transfer level: Needs assistance Equipment used: 2 person hand held assist;1 person hand held assist Transfers: Stand Pivot Transfers;Sit to/from Stand Sit to Stand: Mod assist Stand pivot transfers: Mod assist       General transfer comment: Mod A for encouragement. Pt doesn't lack the ability or strength, she requires encouragement to initiate, participate, and complete    Balance     Sitting balance-Leahy Scale: Good       Standing balance-Leahy Scale: Fair                             ADL either performed or assessed with clinical judgement   ADL Overall ADL's : Needs assistance/impaired Eating/Feeding: Modified  independent;Sitting   Grooming: Set up;Supervision/safety;Sitting           Upper Body Dressing : Set up;Supervision/safety;Sitting   Lower Body Dressing: Minimal assistance;Sit to/from stand               Functional mobility during ADLs: Moderate assistance General ADL Comments: Pt required encouragement to pariticipate with ADLs.      Vision       Perception     Praxis      Cognition Arousal/Alertness: Awake/alert Behavior During Therapy: Flat affect Overall Cognitive Status: No family/caregiver present to determine baseline cognitive functioning Area of Impairment: Orientation;Memory                 Orientation Level: Place;Time;Situation     Following Commands: Follows one step commands inconsistently Safety/Judgement: Decreased awareness of safety   Problem Solving: Decreased initiation;Requires verbal cues General Comments: Max A for encouragement to participate        Exercises     Shoulder Instructions       General Comments Pt required encourage to partipate with mobility and ADLs.    Pertinent Vitals/ Pain       Pain Assessment: No/denies pain Faces Pain Scale: Hurts little more Pain Location: B feet Pain Descriptors / Indicators: Moaning;Discomfort Pain Intervention(s): Monitored during session (donned pt's bedroom shoes to increase comfort during ambulation)  Home Living  Prior Functioning/Environment              Frequency  Min 2X/week        Progress Toward Goals  OT Goals(current goals can now be found in the care plan section)     Acute Rehab OT Goals Patient Stated Goal: to go home to her dog Molly OT Goal Formulation: With patient Time For Goal Achievement: 01/26/20 Potential to Achieve Goals: Good ADL Goals Pt Will Perform Grooming: with supervision;standing Pt Will Perform Upper Body Bathing: with supervision;sitting;standing Pt Will Perform  Lower Body Bathing: with supervision;sit to/from stand Pt Will Perform Upper Body Dressing: with supervision;sitting;standing Pt Will Perform Lower Body Dressing: with set-up;with supervision;sit to/from stand Pt Will Transfer to Toilet: with supervision;ambulating;regular height toilet;grab bars Pt Will Perform Toileting - Clothing Manipulation and hygiene: with supervision;sit to/from stand Additional ADL Goal #1: Pt will be independent in and OOB for basic ADLs (HOB flat, no rail) Additional ADL Goal #2: Pt will be able to complete pill box test with no more than 2 errors  Plan      Co-evaluation                 AM-PAC OT "6 Clicks" Daily Activity     Outcome Measure   Help from another person eating meals?: None Help from another person taking care of personal grooming?: A Little Help from another person toileting, which includes using toliet, bedpan, or urinal?: A Little Help from another person bathing (including washing, rinsing, drying)?: A Little Help from another person to put on and taking off regular upper body clothing?: A Little Help from another person to put on and taking off regular lower body clothing?: A Little 6 Click Score: 19    End of Session Equipment Utilized During Treatment: Rolling walker  OT Visit Diagnosis: Unsteadiness on feet (R26.81);Other abnormalities of gait and mobility (R26.89);Other symptoms and signs involving cognitive function;Pain   Activity Tolerance Patient tolerated treatment well   Patient Left in chair;with call bell/phone within reach;with chair alarm set   Nurse Communication  (ok therapy)        Time: 4332-9518 OT Time Calculation (min): 30 min  Charges: OT General Charges $OT Visit: 1 Visit OT Treatments $Self Care/Home Management : 23-37 mins  Derrek Gu OT/L    Kedron Uno 01/20/2020, 11:19 AM

## 2020-01-20 NOTE — TOC Progression Note (Signed)
Transition of Care [facility]) - Progression Note    Patient Details  Name: Amy Whitehead MRN: [id number] Date of Birth: 02/13/1934  Transition of Care [facility]) CM/SW Contact  Silverio Hagan, Olegario Messier, RN Phone Number: 01/20/2020, 2:38 PM  Clinical Narrative: After numerous attempts to reach dtr Sarina Ill tel#[phone]-can only leave vm-last message informed of concern for her welfare-TC to wellness check/welfare check/non emergency-they will send a police officer to patient's home to check. Will inform my supv of steps taken. SNF [facility] rep Janie aware.      Expected Discharge Plan: Skilled Nursing Facility Barriers to Discharge: Family Issues  Expected Discharge Plan and Services Expected Discharge Plan: Skilled Nursing Facility   Discharge Planning Services: CM Consult     Expected Discharge Date: 01/20/20                                     Social Determinants of Health (SDOH) Interventions    Readmission Risk Interventions No flowsheet data found.

## 2020-01-20 NOTE — TOC Progression Note (Addendum)
Transition of Care [facility]) - Progression Note    Patient Details  Name: Amy Whitehead MRN: [id number] Date of Birth: 1933-09-03  Transition of Care [facility]) CM/SW Contact  Humzah Harty, Olegario Messier, RN Phone Number: 01/20/2020, 11:36 AM  Clinical Narrative: [facility] rep Janie-awaiting auth-left vm w/[name] about SNF sign in process-awaiting call back from [facility], covid ordered.Medically stable for d/c.    1:30p-[facility] called for WILL CALL-awaiting rm#,nsg report tel#, & will f/u on dtr [name] being aware of d/c since 2 days now,& unable to reach her today.Nsg to call PTAR once ready.  Expected Discharge Plan: Skilled Nursing Facility Barriers to Discharge: Insurance Authorization  Expected Discharge Plan and Services Expected Discharge Plan: Skilled Nursing Facility   Discharge Planning Services: CM Consult     Expected Discharge Date: 01/20/20                                     Social Determinants of Health (SDOH) Interventions    Readmission Risk Interventions No flowsheet data found.

## 2020-01-21 ENCOUNTER — Telehealth: Payer: Self-pay

## 2020-01-21 NOTE — Telephone Encounter (Signed)
Called to check on pt LVM   Call to schedule appt for follow up hospitalization

## 2020-02-17 ENCOUNTER — Ambulatory Visit: Payer: Medicare (Managed Care) | Admitting: Podiatry

## 2020-02-29 DIAGNOSIS — K5900 Constipation, unspecified: Secondary | ICD-10-CM | POA: Diagnosis not present

## 2020-02-29 DIAGNOSIS — G629 Polyneuropathy, unspecified: Secondary | ICD-10-CM | POA: Diagnosis not present

## 2020-03-04 ENCOUNTER — Other Ambulatory Visit: Payer: Self-pay | Admitting: Nurse Practitioner

## 2020-03-04 DIAGNOSIS — R4182 Altered mental status, unspecified: Secondary | ICD-10-CM

## 2020-03-04 DIAGNOSIS — R5381 Other malaise: Secondary | ICD-10-CM

## 2020-03-05 ENCOUNTER — Ambulatory Visit: Payer: Self-pay

## 2020-03-05 NOTE — Chronic Care Management (AMB) (Signed)
  Chronic Care Management   Outreach Note  03/05/2020 Name: Amy Whitehead MRN: [id number] DOB: 1933-11-26  Referred by: Arnette Felts, FNP Reason for referral : Care Coordination   An unsuccessful telephone outreach was attempted today. The patient was referred to the case management team for assistance with care management and care coordination.   Follow Up Plan: A HIPAA compliant phone message was left for the patients daughter providing contact information and requesting a return call.  The care management team will reach out to the patient again over the next 3 days.   Bevelyn Ngo, BSW, CDP Social Worker, Certified Dementia Practitioner [facility] / [facility] Care Management [phone]

## 2020-03-08 ENCOUNTER — Telehealth: Payer: Self-pay

## 2020-03-08 ENCOUNTER — Telehealth: Payer: Medicare HMO

## 2020-03-08 ENCOUNTER — Ambulatory Visit: Payer: Self-pay

## 2020-03-08 ENCOUNTER — Other Ambulatory Visit: Payer: Self-pay

## 2020-03-08 DIAGNOSIS — R5381 Other malaise: Secondary | ICD-10-CM

## 2020-03-08 DIAGNOSIS — R4182 Altered mental status, unspecified: Secondary | ICD-10-CM

## 2020-03-08 NOTE — Chronic Care Management (AMB) (Signed)
Social Work Note  03/08/2020 Name: Amy Whitehead MRN: [id number] DOB: [birth year]  Amy Whitehead is a 87 y.o. year old female who is a primary care patient of Fendi, Meinhardt, FNP.  The Care Management team was consulted for assistance with chronic disease management and care coordination needs.  Amy Whitehead was given information about Care Management services today including:  1. Care Management services include personalized support from designated clinical staff supervised by her physician, including individualized plan of care and coordination with other care providers 2. 24/7 contact phone numbers for assistance for urgent and routine care needs. 3. The patient may stop care management services at any time (effective at the end of the month) by phone call to the office staff.  Patient agreed to services and consent obtained.   Engaged with patients daughter, Amy Whitehead, by phone for initial visit in response to provider referral for social work chronic care management and care coordination services.  Assessment/Interventions: Review of patient past medical history, allergies, medications, and health status, including review of pertinent consultant reports was performed as part of comprehensive evaluation and provision of care management/care coordination services.   SDOH (Social Determinants of Health) assessments and interventions performed:  No  Advanced Directives Status: Not addressed in this encounter.  Care Plan  No Known Allergies  Outpatient Encounter Medications as of 03/08/2020  Medication Sig  . amLODipine (NORVASC) 5 MG tablet Take 1 tablet (5 mg total) by mouth daily.  Marland Kitchen aspirin EC 81 MG tablet Take 81 mg by mouth daily.  . Calcium-Vitamin D (CALTRATE 600 PLUS-VIT D PO) Take 2 tablets by mouth 2 (two) times daily.  Marland Kitchen docusate sodium (COLACE) 100 MG capsule Take 1 capsule (100 mg total) by mouth daily as needed for mild constipation.  . gabapentin (NEURONTIN) 300 MG  capsule TAKE 3 CAPSULES BY MOUTH THREE TIMES DAILY (Patient taking differently: Take 900 mg by mouth 3 (three) times daily. )  . Multiple Vitamin (MULTIVITAMIN WITH MINERALS) TABS tablet Take 1 tablet by mouth daily.  . polyethylene glycol powder (MIRALAX) 17 GM/SCOOP powder Take 17 g by mouth 2 (two) times daily as needed for moderate constipation.  . QUEtiapine (SEROQUEL) 25 MG tablet Take 1 tablet (25 mg total) by mouth at bedtime.   No facility-administered encounter medications on file as of 03/08/2020.    Patient Active Problem List   Diagnosis Date Noted  . AMS (altered mental status) 01/11/2020  . Nocturia 03/12/2015  . Varicose veins 07/18/2014  . Plantar fasciitis of left foot 04/17/2011  . Post herpetic neuralgia 04/17/2011  . Health care maintenance 04/17/2011    Conditions to be addressed/monitored:  Long term care planning  Patient Care Plan: Assist with Chronic Care Management and Care Coordination Needs    Problem Identified: Assist with Chronic Care Management and Care Coodination Needs   Priority: High    Goal: Patient-Specific Goal   Start Date: 03/08/2020  Expected End Date: 04/26/2020  This Visit's Progress: On track  Priority: High  Note:   Current Barriers:   Ineffective Self Health Maintenance  Current resident in SNF, daughter assists with care needs   Currently UNABLE TO independently self manage needs related to chronic health conditions.   Knowledge Deficits related to short term plan for care coordination needs and long term plans for chronic disease management needs Nurse Case Manager Clinical Goal(s):   Over the next 45 days, patient will work with care management team to address care coordination and chronic  disease management needs related to Disease Management  Educational Needs  Care Coordination  Medication Management and Education  Psychosocial Support  Caregiver Stress support   Interventions:   One on one collaboration to  establish plan of care to address care coordination and disease management needs with clinical colleague Bevelyn Ngo BSW.   Follow Up Plan: Telephone follow up appointment with care management team member scheduled for: 04/07/20    Patient Care Plan: Social Work Care Plan    Problem Identified: Care Coordination     Goal: Assist with long term care placement   Start Date: 03/08/2020  Expected End Date: 05/07/2020  This Visit's Progress: On track  Priority: High  Note:   Current Barriers:  . Level of care concerns . ADL IADL limitations . Cognitive Deficits - unclear if patient is appropriate for ALF or Memory Care . Currently out of SNF rehab days- patient unable to return home due to altered mental status . Limited access to care giver - daughter lives in [place]  Social Work Clinical Goal(s):  Marland Kitchen Over the next 14 days, patient will follow up with primary care provider* as directed by SW  Interventions: . 1:1 collaboration with Arnette Felts, FNP regarding development and update of comprehensive plan of care as evidenced by provider attestation and co-signature . Inter-disciplinary care team collaboration (see longitudinal plan of care) . Successful outbound call placed to the patients daughter in response to referral received for care coordination needs . Discussed the patient has been at [facility] for rehab since 01/21/20 - Medicare stopped covering patient stay as of 02/03/20 . Determined the patients daughter is unsure if the patient needs memory care for long term care- reports that while the patient was admitted in the hospital the physician verbally stated the patient "had dementia" but no official diagnosis was made. Family instructed to follow up with PCP . Patients daughter has visited with the patient and does feel the patients cognition has changed. She no longer feels patient may live independently but is not sure if the patient needs memory care or assisted  living . Collaboration with Arnette Felts FNP to discuss family concern of patient altered mental status- OV scheduled for 03/18/20 . Provided Amy Whitehead with a list of Assisted Living and Memory Care communities in the area for review- Amy Whitehead prefers the patient to stay in [place]. At this time a budget has not been determined but the family is working with the patients financial Engineer, technical sales to obtain this information. A private room is preferred . Scheduled follow up call to the patients daughter on 03/19/20  . Collaboration with RN Care Manager Lawanna Kobus Little to inform of today's interventions and plan for follow up  Patient Goals/Self-Care Activities Over the next 20 days, patient will: With the help of her daughter Amy Whitehead  - Patient will attend all scheduled provider appointments Patient will call provider office for new concerns or questions Review e-mail correspondence to being researching long term care communities Contact SW as needed prior to next scheduled call  Follow up Plan: SW will follow up with patient by phone over the next 2 weeks       Follow Up Plan: SW will follow up with patient by phone over the next two weeks.      Bevelyn Ngo, BSW, CDP Social Worker, Certified Dementia Practitioner [facility] / [facility] Care Management [phone]

## 2020-03-08 NOTE — Telephone Encounter (Signed)
  Chronic Care Management   Outreach Note  03/08/2020 Name: Amy Whitehead MRN: [id number] DOB: 08/02/1933  Referred by: Arnette Felts, FNP Reason for referral : Care Coordination   A second unsuccessful telephone outreach was attempted today. The patient was referred to the case management team for assistance with care management and care coordination.   Follow Up Plan: A HIPAA compliant phone message was left for the patient providing contact information and requesting a return call.  The care management team will reach out to the patient again over the next 10 days.   Bevelyn Ngo, BSW, CDP Social Worker, Certified Dementia Practitioner [facility] / [facility] Care Management [phone]

## 2020-03-08 NOTE — Patient Instructions (Signed)
  Goals we discussed today:  Goals Addressed            This Visit's Progress   . Identify long term care plan       Timeframe:  Short-Term Goal Priority:  High Start Date:  1.10.22                           Expected End Date:   3.11.22                    Next planned outreach: 1.21.22  Patient Goals/Self-Care Activities Over the next 20 days, patient will: With the help of her daughter Sarina Ill  - Patient will attend all scheduled provider appointments Patient will call provider office for new concerns or questions Review e-mail correspondence to being researching long term care communities Contact SW as needed prior to next scheduled call

## 2020-03-08 NOTE — Chronic Care Management (AMB) (Addendum)
Care Management   CCM RN Visit Note  03/08/2020 Name: Amy Whitehead MRN: [id number] DOB: 03-07-1933  Subjective: Amy Whitehead is a 87 y.o. year old female who is a primary care patient of [place], Holian, FNP. The CCM team was consulted for assistance with chronic disease management and care coordination needs.    Collaboration with Bevelyn Ngo BSW  for Case Collaboration  in response to provider referral for pharmacy case management and/or care coordination services.   Consent to Services:  The patient was given information about Chronic Care Management services, agreed to services, and gave verbal consent prior to initiation of services.  Please see initial visit note for detailed documentation.   Patient agreed to services and verbal consent obtained.   Assessment/Interventions: Review of patient past medical history, allergies, medications, health status, including review of consultants reports, laboratory and other test data, was performed as part of comprehensive evaluation and provision of chronic care management services.   SDOH (Social Determinants of Health) assessments and interventions performed:  No  Collaboration with embedded BSW Bevelyn Ngo regarding Chronic Care Management and Care Coordination needs for this patient and a care plan was initiated.   CCM Care Plan  No Known Allergies  Outpatient Encounter Medications as of 03/08/2020  Medication Sig  . amLODipine (NORVASC) 5 MG tablet Take 1 tablet (5 mg total) by mouth daily.  Marland Kitchen aspirin EC 81 MG tablet Take 81 mg by mouth daily.  . Calcium-Vitamin D (CALTRATE 600 PLUS-VIT D PO) Take 2 tablets by mouth 2 (two) times daily.  Marland Kitchen docusate sodium (COLACE) 100 MG capsule Take 1 capsule (100 mg total) by mouth daily as needed for mild constipation.  . gabapentin (NEURONTIN) 300 MG capsule TAKE 3 CAPSULES BY MOUTH THREE TIMES DAILY (Patient taking differently: Take 900 mg by mouth 3 (three) times daily. )  . Multiple Vitamin  (MULTIVITAMIN WITH MINERALS) TABS tablet Take 1 tablet by mouth daily.  . polyethylene glycol powder (MIRALAX) 17 GM/SCOOP powder Take 17 g by mouth 2 (two) times daily as needed for moderate constipation.  . QUEtiapine (SEROQUEL) 25 MG tablet Take 1 tablet (25 mg total) by mouth at bedtime.   No facility-administered encounter medications on file as of 03/08/2020.    Patient Active Problem List   Diagnosis Date Noted  . AMS (altered mental status) 01/11/2020  . Nocturia 03/12/2015  . Varicose veins 07/18/2014  . Plantar fasciitis of left foot 04/17/2011  . Post herpetic neuralgia 04/17/2011  . Health care maintenance 04/17/2011    Conditions to be addressed/monitored:Impaired memory, declining health   Patient Care Plan: Assist with Chronic Care Management and Care Coordination Needs    Problem Identified: Assist with Chronic Care Management and Care Coodination Needs   Priority: High    Goal: Patient-Specific Goal   Start Date: 03/08/2020  Expected End Date: 04/26/2020  This Visit's Progress: On track  Priority: High  Note:   Current Barriers:   Ineffective Self Health Maintenance  Current resident in SNF, daughter assists with care needs   Currently UNABLE TO independently self manage needs related to chronic health conditions.   Knowledge Deficits related to short term plan for care coordination needs and long term plans for chronic disease management needs Nurse Case Manager Clinical Goal(s):   Over the next 45 days, patient will work with care management team to address care coordination and chronic disease management needs related to Disease Management  Educational Needs  Care Coordination  Medication Management and  Education  Psychosocial Support  Caregiver Stress support   Interventions:   One on one collaboration to establish plan of care to address care coordination and disease management needs with clinical colleague Bevelyn Ngo BSW.   Follow Up  Plan: Telephone follow up appointment with care management team member scheduled for: 04/07/20     Plan:Telephone follow up appointment with care management team member scheduled for:  04/07/20  Delsa Sale, RN, BSN, CCM Care Management Coordinator [facility] Care Management/[facility]  Direct Phone: [phone]

## 2020-03-17 DIAGNOSIS — G629 Polyneuropathy, unspecified: Secondary | ICD-10-CM | POA: Diagnosis not present

## 2020-03-17 DIAGNOSIS — K5900 Constipation, unspecified: Secondary | ICD-10-CM | POA: Diagnosis not present

## 2020-03-17 DIAGNOSIS — I10 Essential (primary) hypertension: Secondary | ICD-10-CM | POA: Diagnosis not present

## 2020-03-18 ENCOUNTER — Ambulatory Visit: Payer: Self-pay | Admitting: Nurse Practitioner

## 2020-03-19 ENCOUNTER — Telehealth: Payer: Self-pay

## 2020-03-19 ENCOUNTER — Telehealth: Payer: Medicare (Managed Care)

## 2020-03-19 NOTE — Telephone Encounter (Signed)
  Chronic Care Management   Outreach Note  03/19/2020 Name: Amy Whitehead MRN: [id number] DOB: 1933-08-31  Referred by: Arnette Felts, FNP Reason for referral : Care Coordination   An unsuccessful telephone outreach was attempted today. The patient was referred to the case management team for assistance with care management and care coordination.   Follow Up Plan: A HIPAA compliant phone message was left for the patient providing contact information and requesting a return call.  The care management team will reach out to the patient again over the next 14 days.   Bevelyn Ngo, BSW, CDP Social Worker, Certified Dementia Practitioner [facility] / [facility] Care Management [phone]

## 2020-04-01 ENCOUNTER — Encounter: Payer: Self-pay | Admitting: Nurse Practitioner

## 2020-04-01 ENCOUNTER — Other Ambulatory Visit: Payer: Self-pay

## 2020-04-01 ENCOUNTER — Ambulatory Visit (INDEPENDENT_AMBULATORY_CARE_PROVIDER_SITE_OTHER): Payer: Medicare HMO | Admitting: Nurse Practitioner

## 2020-04-01 VITALS — BP 132/74 | HR 64 | Temp 98.0°F | Ht 67.0 in | Wt 118.0 lb

## 2020-04-01 DIAGNOSIS — R5381 Other malaise: Secondary | ICD-10-CM | POA: Diagnosis not present

## 2020-04-01 DIAGNOSIS — E559 Vitamin D deficiency, unspecified: Secondary | ICD-10-CM | POA: Diagnosis not present

## 2020-04-01 DIAGNOSIS — K5900 Constipation, unspecified: Secondary | ICD-10-CM | POA: Diagnosis not present

## 2020-04-01 DIAGNOSIS — G629 Polyneuropathy, unspecified: Secondary | ICD-10-CM | POA: Diagnosis not present

## 2020-04-01 DIAGNOSIS — I10 Essential (primary) hypertension: Secondary | ICD-10-CM | POA: Diagnosis not present

## 2020-04-01 DIAGNOSIS — R413 Other amnesia: Secondary | ICD-10-CM | POA: Diagnosis not present

## 2020-04-01 DIAGNOSIS — R4182 Altered mental status, unspecified: Secondary | ICD-10-CM

## 2020-04-01 DIAGNOSIS — R4189 Other symptoms and signs involving cognitive functions and awareness: Secondary | ICD-10-CM

## 2020-04-01 NOTE — Progress Notes (Signed)
I,[name] as a Neurosurgeon for [employer], FNP.,have documented all relevant documentation on the behalf of Ursala Cressy, FNP,as directed by  Arnette Felts, FNP while in the presence of Arnette Felts, FNP. This visit occurred during the SARS-CoV-2 public health emergency.  Safety protocols were in place, including screening questions prior to the visit, additional usage of staff PPE, and extensive cleaning of exam room while observing appropriate contact time as indicated for disinfecting solutions.  Subjective:     Patient ID: Amy Whitehead , female    DOB: 1933/03/16 , 87 y.o.   MRN: [id number]   Chief Complaint  Patient presents with   Memory Loss    HPI  She is here today because her daughter is concerned about her memory and ability to stay home alone.  She is at [facility] after being hospitalized, she is no longer in PT from November 24 - December 7th.  She has been there since that time.  Her dog knocked her against her wall and injured her left hand.  She had also developed an ulcer to her buttocks.  She had been treated with an antibiotic at that time. The patient feels like she is okay to live alone at home  When she was hospitalized her friend had come by when they could not contact her EMS was called. The daughter is concerned about her staying home alone.     Past Medical History:  Diagnosis Date   Arthritis    Cataract    Post herpetic neuralgia 04/17/2011     Family History  Problem Relation Age of Onset   Cancer Father        brain tumor   Cancer Sister        lung   Diabetes Sister      Current Outpatient Medications:    acetaminophen (TYLENOL) 500 MG tablet, Take 500 mg by mouth every 6 (six) hours as needed., Disp: , Rfl:    amLODipine (NORVASC) 5 MG tablet, Take 1 tablet (5 mg total) by mouth daily., Disp: 90 tablet, Rfl: 1   aspirin EC 81 MG tablet, Take 81 mg by mouth daily., Disp: , Rfl:    Calcium-Vitamin D (CALTRATE 600  PLUS-VIT D PO), Take 2 tablets by mouth 2 (two) times daily., Disp: , Rfl:    gabapentin (NEURONTIN) 300 MG capsule, TAKE 3 CAPSULES BY MOUTH THREE TIMES DAILY (Patient taking differently: Take 900 mg by mouth 3 (three) times daily.), Disp: 270 capsule, Rfl: 0   Multiple Vitamin (MULTIVITAMIN WITH MINERALS) TABS tablet, Take 1 tablet by mouth daily., Disp: , Rfl:    polyethylene glycol powder (MIRALAX) 17 GM/SCOOP powder, Take 17 g by mouth 2 (two) times daily as needed for moderate constipation., Disp: 255 g, Rfl: 0   QUEtiapine (SEROQUEL) 25 MG tablet, Take 1 tablet (25 mg total) by mouth at bedtime., Disp: 30 tablet, Rfl: 1   docusate sodium (COLACE) 100 MG capsule, Take 1 capsule (100 mg total) by mouth daily as needed for mild constipation. (Patient not taking: Reported on 04/01/2020), Disp: 60 capsule, Rfl: 2   No Known Allergies   Review of Systems  Constitutional: Negative.   HENT: Negative.   Eyes: Negative.   Respiratory: Negative.   Cardiovascular: Negative.   Gastrointestinal: Negative.   Endocrine: Negative.   Genitourinary: Negative.   Musculoskeletal: Negative.   Skin: Negative.   Neurological: Negative.   Hematological: Negative.   Psychiatric/Behavioral: Positive for confusion.     Today's Vitals  04/01/20 1543  BP: 132/74  Pulse: 64  Temp: 98 F (36.7 C)  TempSrc: Oral  SpO2: 95%  Weight: 118 lb (53.5 kg)  Height: 5\' 7"  (1.702 m)  PainSc: 0-No pain   Body mass index is 18.48 kg/m.   Objective:  Physical Exam Constitutional:      General: She is not in acute distress.    Appearance: Normal appearance.  Cardiovascular:     Rate and Rhythm: Normal rate and regular rhythm.     Pulses: Normal pulses.     Heart sounds: Normal heart sounds. No murmur heard.   Pulmonary:     Effort: Pulmonary effort is normal. No respiratory distress.     Breath sounds: Normal breath sounds. No wheezing.  Skin:    Capillary Refill: Capillary refill takes less than  2 seconds.  Neurological:     General: No focal deficit present.     Mental Status: She is alert. She is confused.     Cranial Nerves: No cranial nerve deficit.     Comments: She is oriented but has episodes of repeating her questions throughout the visits multiple times. She is able to answer simple questions but does not remember why she went to the hospital .   Psychiatric:        Mood and Affect: Mood normal.        Behavior: Behavior normal.        Thought Content: Thought content normal.        Judgment: Judgment normal.         Assessment And Plan:     1. Altered mental status, unspecified altered mental status type  She was hospitalized in November for encephalopathy related to cellulitis, since that time she has been at December. There are concerns about her ability to care for herself at home alone.  At this time based on her visit today I do not feel she is safe to care for herself at home, she is having some difficulty with short term memory.  I will refer her to Neurology for further testing to evaluate for dementia/alzheimers or other neurological findings.   2. Declining functional status  She is requiring assistance with her cooking/cleaning. And is now residing in a nursing facility but is ready to go home.  - Ambulatory referral to Neurology  3. Persistent cognitive impairment  Will check for metabolic causes and refer to Neurology for further evaluation - Ambulatory referral to Neurology - TSH - Vitamin B12 - RPR - Vitamin D (25 hydroxy)     Patient was given opportunity to ask questions. Patient verbalized understanding of the plan and was able to repeat key elements of the plan. All questions were answered to their satisfaction.  [employer], FNP   I, Arnette Felts, FNP, have reviewed all documentation for this visit. The documentation on 04/01/20 for the exam, diagnosis, procedures, and orders are all accurate and complete.   THE PATIENT IS  ENCOURAGED TO PRACTICE SOCIAL DISTANCING DUE TO THE COVID-19 PANDEMIC.

## 2020-04-01 NOTE — Patient Instructions (Signed)
I have made a referral to [facility] they will call with an appt.  [phone]

## 2020-04-02 LAB — VITAMIN B12: Vitamin B-12: 950 pg/mL (ref 232–1245)

## 2020-04-02 LAB — RPR: RPR Ser Ql: NONREACTIVE

## 2020-04-02 LAB — VITAMIN D 25 HYDROXY (VIT D DEFICIENCY, FRACTURES): Vit D, 25-Hydroxy: 80.8 ng/mL (ref 30.0–100.0)

## 2020-04-02 LAB — TSH: TSH: 6.07 u[IU]/mL — ABNORMAL HIGH (ref 0.450–4.500)

## 2020-04-05 ENCOUNTER — Telehealth: Payer: Medicare HMO

## 2020-04-05 ENCOUNTER — Telehealth: Payer: Self-pay

## 2020-04-05 NOTE — Telephone Encounter (Signed)
  Chronic Care Management   Outreach Note  04/05/2020 Name: Amy Whitehead MRN: [id number] DOB: 27-Dec-1933  Referred by: Arnette Felts, FNP Reason for referral : Care Coordination   A second unsuccessful telephone outreach was attempted today. The patient was referred to the case management team for assistance with care management and care coordination.     Follow Up Plan: A HIPAA compliant phone message was left for the patient providing contact information and requesting a return call.  The care management team will reach out to the patient again over the next 21 days.   Bevelyn Ngo, BSW, CDP Social Worker, Certified Dementia Practitioner [facility] / [facility] Care Management [phone]

## 2020-04-07 ENCOUNTER — Telehealth: Payer: Medicare (Managed Care)

## 2020-04-17 DIAGNOSIS — K5900 Constipation, unspecified: Secondary | ICD-10-CM | POA: Diagnosis not present

## 2020-04-17 DIAGNOSIS — G629 Polyneuropathy, unspecified: Secondary | ICD-10-CM | POA: Diagnosis not present

## 2020-04-20 DIAGNOSIS — G629 Polyneuropathy, unspecified: Secondary | ICD-10-CM | POA: Diagnosis not present

## 2020-04-20 DIAGNOSIS — F05 Delirium due to known physiological condition: Secondary | ICD-10-CM | POA: Diagnosis not present

## 2020-04-23 ENCOUNTER — Telehealth: Payer: Medicare HMO

## 2020-04-23 ENCOUNTER — Telehealth: Payer: Self-pay

## 2020-04-23 NOTE — Telephone Encounter (Signed)
  Chronic Care Management   Outreach Note  04/23/2020 Name: Amy Whitehead MRN: [id number] DOB: 12/01/1933  Referred by: Arnette Felts, FNP Reason for referral : Chronic Care Management   Sw placed a third unsuccessful outbound call to the patients daughter to assess for care coordination needs. Upon chart review it is noted patient recently referred to neurologist to assess patients ability to live independently in her home. SW will schedule a fourth and final outreach attempt over the next 45 days.  Follow Up Plan: SW will follow up with the patients daughter over the next 45 days.  Bevelyn Ngo, BSW, CDP Social Worker, Certified Dementia Practitioner [facility] Care Management [phone]

## 2020-05-05 ENCOUNTER — Ambulatory Visit: Payer: Medicare (Managed Care) | Admitting: Nurse Practitioner

## 2020-05-05 ENCOUNTER — Telehealth: Payer: Self-pay

## 2020-05-05 NOTE — Telephone Encounter (Signed)
This nurse attempted to call patient in regards to her missing today's appointments. There was no answer and no voicemail to leave a message.

## 2020-05-06 ENCOUNTER — Other Ambulatory Visit: Payer: Medicare HMO

## 2020-05-06 ENCOUNTER — Other Ambulatory Visit: Payer: Self-pay

## 2020-05-06 DIAGNOSIS — R946 Abnormal results of thyroid function studies: Secondary | ICD-10-CM

## 2020-05-07 ENCOUNTER — Telehealth: Payer: Medicare HMO

## 2020-05-07 LAB — T3, FREE: T3, Free: 2.5 pg/mL (ref 2.0–4.4)

## 2020-05-07 LAB — TSH: TSH: 9.57 u[IU]/mL — ABNORMAL HIGH (ref 0.450–4.500)

## 2020-05-07 LAB — T4: T4, Total: 5.2 ug/dL (ref 4.5–12.0)

## 2020-05-08 DIAGNOSIS — K5900 Constipation, unspecified: Secondary | ICD-10-CM | POA: Diagnosis not present

## 2020-05-08 DIAGNOSIS — G629 Polyneuropathy, unspecified: Secondary | ICD-10-CM | POA: Diagnosis not present

## 2020-05-08 DIAGNOSIS — G9341 Metabolic encephalopathy: Secondary | ICD-10-CM | POA: Diagnosis not present

## 2020-05-11 ENCOUNTER — Telehealth: Payer: Self-pay

## 2020-05-11 ENCOUNTER — Other Ambulatory Visit: Payer: Self-pay | Admitting: Nurse Practitioner

## 2020-05-11 ENCOUNTER — Telehealth: Payer: Medicare HMO

## 2020-05-11 DIAGNOSIS — E039 Hypothyroidism, unspecified: Secondary | ICD-10-CM

## 2020-05-11 MED ORDER — LEVOTHYROXINE SODIUM 25 MCG PO TABS: 25.0000 ug | ORAL_TABLET | Freq: Every day | ORAL | 3 refills | Status: DC

## 2020-05-11 NOTE — Telephone Encounter (Signed)
  Chronic Care Management   Outreach Note  05/11/2020 Name: Amy Whitehead MRN: [id number] DOB: 1933/05/13  Referred by: Arnette Felts, FNP Reason for referral : Chronic Care Management (Initial RN CM Call Attempt )   An unsuccessful telephone outreach was attempted today. The patient was referred to the case management team for assistance with care management and care coordination.   Follow Up Plan: A HIPAA compliant phone message was left for the patient providing contact information and requesting a return call. Telephone follow up appointment with care management team member scheduled for: 06/18/20  Delsa Sale, RN, BSN, CCM Care Management Coordinator [facility] Care Management/[facility]  Direct Phone: [phone]

## 2020-05-13 ENCOUNTER — Telehealth: Payer: Medicare HMO

## 2020-05-13 ENCOUNTER — Ambulatory Visit: Payer: Self-pay

## 2020-05-13 DIAGNOSIS — R4182 Altered mental status, unspecified: Secondary | ICD-10-CM

## 2020-05-13 DIAGNOSIS — E039 Hypothyroidism, unspecified: Secondary | ICD-10-CM

## 2020-05-13 DIAGNOSIS — R5381 Other malaise: Secondary | ICD-10-CM

## 2020-05-13 NOTE — Chronic Care Management (AMB) (Signed)
Chronic Care Management   CCM RN Visit Note  05/13/2020 Name: Amy Whitehead MRN: [id number] DOB: [birth year]  Subjective: Amy Whitehead is a 87 y.o. year old female who is a primary care patient of Doloras, [place], FNP. The care management team was consulted for assistance with disease management and care coordination needs.    Engaged with patient by telephone for initial visit in response to provider referral for case management and/or care coordination services.   Consent to Services:  The patient was given information about Chronic Care Management services, agreed to services, and gave verbal consent prior to initiation of services.  Please see initial visit note for detailed documentation.   Patient agreed to services and verbal consent obtained.   Assessment: Review of patient past medical history, allergies, medications, health status, including review of consultants reports, laboratory and other test data, was performed as part of comprehensive evaluation and provision of chronic care management services.   SDOH (Social Determinants of Health) assessments and interventions performed:    CCM Care Plan  No Known Allergies  Outpatient Encounter Medications as of 05/13/2020  Medication Sig  . acetaminophen (TYLENOL) 500 MG tablet Take 500 mg by mouth every 6 (six) hours as needed.  Marland Kitchen amLODipine (NORVASC) 5 MG tablet Take 1 tablet (5 mg total) by mouth daily.  Marland Kitchen aspirin EC 81 MG tablet Take 81 mg by mouth daily.  . Calcium-Vitamin D (CALTRATE 600 PLUS-VIT D PO) Take 2 tablets by mouth 2 (two) times daily.  Marland Kitchen docusate sodium (COLACE) 100 MG capsule Take 1 capsule (100 mg total) by mouth daily as needed for mild constipation. (Patient not taking: Reported on 04/01/2020)  . gabapentin (NEURONTIN) 300 MG capsule TAKE 3 CAPSULES BY MOUTH THREE TIMES DAILY (Patient taking differently: Take 900 mg by mouth 3 (three) times daily.)  . levothyroxine (SYNTHROID) 25 MCG tablet Take 1 tablet (25 mcg  total) by mouth daily before breakfast.  . Multiple Vitamin (MULTIVITAMIN WITH MINERALS) TABS tablet Take 1 tablet by mouth daily.  . polyethylene glycol powder (MIRALAX) 17 GM/SCOOP powder Take 17 g by mouth 2 (two) times daily as needed for moderate constipation.  . QUEtiapine (SEROQUEL) 25 MG tablet Take 1 tablet (25 mg total) by mouth at bedtime.   No facility-administered encounter medications on file as of 05/13/2020.    Patient Active Problem List   Diagnosis Date Noted  . AMS (altered mental status) 01/11/2020  . Nocturia 03/12/2015  . Varicose veins 07/18/2014  . Plantar fasciitis of left foot 04/17/2011  . Post herpetic neuralgia 04/17/2011  . Health care maintenance 04/17/2011    Conditions to be addressed/monitored:Acquired hypothyroidism, Altered Mental Status, Declining Function   Care Plan : Assist with Chronic Care Management and Care Coordination Needs  Updates made by Riley Churches, RN since 05/13/2020 12:00 AM  Completed 05/13/2020  Problem: Assist with Chronic Care Management and Care Coodination Needs Resolved 05/13/2020  Priority: High    Goal: Assist with Chronic Care Management and Care Coordination needs Completed 05/13/2020  Start Date: 03/08/2020  Expected End Date: 04/26/2020  Recent Progress: On track  Priority: High  Note:   Current Barriers:   Ineffective Self Health Maintenance  Current resident in SNF, daughter assists with care needs   Currently UNABLE TO independently self manage needs related to chronic health conditions.   Knowledge Deficits related to short term plan for care coordination needs and long term plans for chronic disease management needs Nurse Case Manager Clinical Goal(s):  Over the next 45 days, patient will work with care management team to address care coordination and chronic disease management needs related to Disease Management  Educational Needs  Care Coordination  Medication Management and  Education  Psychosocial Support  Caregiver Stress support   Interventions:   05/13/20 Successful call completed with daughter Sarina Ill  Determined daughter [name] is working with [name]'s estate attorney to help relocate her into new SNF for long term living/care  Offered to have the embedded BSW contact her to further assist, she declines at this time and prefers to work with her mother's attorney   Determined daughter [name] will contact the CCM team if needed in the future but does not feel this program will benefit her mother at this time  Dissenrolled patient from the CCM program, notified PCP and embedded BSW Kendra Humble   No further follow up needed at this time     Care Plan : Social Work Care Plan  Updates made by Riley Churches, RN since 05/13/2020 12:00 AM  Completed 05/13/2020  Problem: Care Coordination Resolved 05/13/2020    Goal: Assist with long term care placement Completed 05/13/2020  Start Date: 03/08/2020  Expected End Date: 05/07/2020  Recent Progress: On track  Priority: High  Note:   Current Barriers:  . Level of care concerns . ADL IADL limitations . Cognitive Deficits - unclear if patient is appropriate for ALF or Memory Care . Currently out of SNF rehab days- patient unable to return home due to altered mental status . Limited access to care giver - daughter lives in [place]  Social Work Clinical Goal(s):  Marland Kitchen Over the next 14 days, patient will follow up with primary care provider* as directed by SW  Interventions: . 1:1 collaboration with Arnette Felts, FNP regarding development and update of comprehensive plan of care as evidenced by provider attestation and co-signature . Inter-disciplinary care team collaboration (see longitudinal plan of care) . Successful outbound call placed to the patients daughter in response to referral received for care coordination needs . Discussed the patient has been at [facility] for rehab since  01/21/20 - Medicare stopped covering patient stay as of 02/03/20 . Determined the patients daughter is unsure if the patient needs memory care for long term care- reports that while the patient was admitted in the hospital the physician verbally stated the patient "had dementia" but no official diagnosis was made. Family instructed to follow up with PCP . Patients daughter has visited with the patient and does feel the patients cognition has changed. She no longer feels patient may live independently but is not sure if the patient needs memory care or assisted living . Collaboration with Arnette Felts FNP to discuss family concern of patient altered mental status- OV scheduled for 03/18/20 . Provided [name] with a list of Assisted Living and Memory Care communities in the area for review- [name] prefers the patient to stay in [place]. At this time a budget has not been determined but the family is working with the patients financial Engineer, technical sales to obtain this information. A private room is preferred . Scheduled follow up call to the patients daughter on 03/19/20  . Collaboration with RN Care Manager Lawanna Kobus Lynette Noah to inform of today's interventions and plan for follow up  Clinical RN CM Interventions:  05/13/20 completed successful call with daughter Sarina Ill . Disenrolled patient from the CCM program per daughters request due to daughter is working with patients estate attorney to help relocate her  to a new SNF  . Discussed daughter will reach out to the CCM team in the future if needed  . PCP & embedded BSW made aware of patient's CCM enrollment status change   Patient Goals/Self-Care Activities Over the next 20 days, patient will: With the help of her daughter Sarina Ill  - Patient will attend all scheduled provider appointments Patient will call provider office for new concerns or questions Review e-mail correspondence to being researching long term care communities Contact SW  as needed prior to next scheduled call  Follow up Plan: no follow up required     Plan:CCM enrollment status changed to "previously enrolled" as per daughter [name] [place]'s request on 05/13/20 to discontinue enrollment. Case closed to case management services in primary care home.   Delsa Sale, RN, BSN, CCM Care Management Coordinator [facility] Care Management/[facility]  Direct Phone: [phone]

## 2020-05-22 DIAGNOSIS — G9341 Metabolic encephalopathy: Secondary | ICD-10-CM | POA: Diagnosis not present

## 2020-05-22 DIAGNOSIS — G629 Polyneuropathy, unspecified: Secondary | ICD-10-CM | POA: Diagnosis not present

## 2020-05-27 ENCOUNTER — Encounter: Payer: Self-pay | Admitting: Nurse Practitioner

## 2020-06-01 ENCOUNTER — Telehealth: Payer: Medicare HMO

## 2020-06-03 ENCOUNTER — Other Ambulatory Visit: Payer: Self-pay

## 2020-06-03 ENCOUNTER — Ambulatory Visit (INDEPENDENT_AMBULATORY_CARE_PROVIDER_SITE_OTHER): Payer: Medicare HMO | Admitting: Nurse Practitioner

## 2020-06-03 ENCOUNTER — Encounter: Payer: Self-pay | Admitting: Nurse Practitioner

## 2020-06-03 VITALS — BP 122/64 | HR 61 | Temp 97.5°F | Ht 67.0 in | Wt 118.0 lb

## 2020-06-03 DIAGNOSIS — Z111 Encounter for screening for respiratory tuberculosis: Secondary | ICD-10-CM | POA: Diagnosis not present

## 2020-06-03 DIAGNOSIS — R4182 Altered mental status, unspecified: Secondary | ICD-10-CM

## 2020-06-03 DIAGNOSIS — G629 Polyneuropathy, unspecified: Secondary | ICD-10-CM | POA: Diagnosis not present

## 2020-06-03 DIAGNOSIS — R5381 Other malaise: Secondary | ICD-10-CM | POA: Diagnosis not present

## 2020-06-03 DIAGNOSIS — Z9989 Dependence on other enabling machines and devices: Secondary | ICD-10-CM

## 2020-06-03 DIAGNOSIS — R011 Cardiac murmur, unspecified: Secondary | ICD-10-CM | POA: Diagnosis not present

## 2020-06-03 DIAGNOSIS — I10 Essential (primary) hypertension: Secondary | ICD-10-CM | POA: Diagnosis not present

## 2020-06-03 DIAGNOSIS — R339 Retention of urine, unspecified: Secondary | ICD-10-CM | POA: Diagnosis not present

## 2020-06-03 NOTE — Progress Notes (Signed)
I,Amy Whitehead as a Neurosurgeon for [employer], FNP.,have documented all relevant documentation on the behalf of Amy Dymond, FNP,as directed by  Arnette Felts, FNP while in the presence of Arnette Felts, FNP. This visit occurred during the SARS-CoV-2 public health emergency.  Safety protocols were in place, including screening questions prior to the visit, additional usage of staff PPE, and extensive cleaning of exam room while observing appropriate contact time as indicated for disinfecting solutions.  Subjective:     Patient ID: Amy Whitehead , female    DOB: 1933-12-22 , 87 y.o.   MRN: [id number]   Chief Complaint  Patient presents with  . Memory Loss    HPI  Patient presents today with her daughter [name] for a f/u on her memory loss.  She is moving her mother out of [facility] today and she will be going to [facility] [place] her daughter will be bringing her to the appts.     Past Medical History:  Diagnosis Date  . Arthritis   . Cataract   . Post herpetic neuralgia 04/17/2011     Family History  Problem Relation Age of Onset  . Cancer Father        brain tumor  . Cancer Sister        lung  . Diabetes Sister      Current Outpatient Medications:  .  acetaminophen (TYLENOL) 500 MG tablet, Take 500 mg by mouth every 6 (six) hours as needed., Disp: , Rfl:  .  aspirin EC 81 MG tablet, Take 81 mg by mouth daily., Disp: , Rfl:  .  Calcium-Vitamin D (CALTRATE 600 PLUS-VIT D PO), Take 2 tablets by mouth 2 (two) times daily., Disp: , Rfl:  .  Multiple Vitamin (MULTIVITAMIN WITH MINERALS) TABS tablet, Take 1 tablet by mouth daily., Disp: , Rfl:  .  polyethylene glycol powder (MIRALAX) 17 GM/SCOOP powder, Take 17 g by mouth 2 (two) times daily as needed for moderate constipation., Disp: 255 g, Rfl: 0 .  amLODipine (NORVASC) 5 MG tablet, Take 1 tablet (5 mg total) by mouth daily., Disp: 90 tablet, Rfl: 1 .  docusate sodium (COLACE) 100 MG capsule,  Take 1 capsule (100 mg total) by mouth daily as needed for mild constipation. (Patient not taking: No sig reported), Disp: 60 capsule, Rfl: 2 .  gabapentin (NEURONTIN) 300 MG capsule, Take 3 capsules (900 mg total) by mouth 3 (three) times daily., Disp: 270 capsule, Rfl: 3 .  levothyroxine (SYNTHROID) 25 MCG tablet, Take 1 tablet (25 mcg total) by mouth daily before breakfast., Disp: 90 tablet, Rfl: 1 .  QUEtiapine (SEROQUEL) 25 MG tablet, Take 1 tablet (25 mg total) by mouth at bedtime., Disp: 90 tablet, Rfl: 1   No Known Allergies   Review of Systems  Constitutional: Negative.  Negative for fatigue.  Respiratory: Negative.   Cardiovascular: Negative.  Negative for chest pain and leg swelling.  Skin: Negative.   Neurological: Negative.        Memory changes, her daughter does not feel this is much better  Psychiatric/Behavioral: Negative.      Today's Vitals   06/03/20 1554  BP: 122/64  Pulse: 61  Temp: (!) 97.5 F (36.4 C)  TempSrc: Oral  Weight: 118 lb (53.5 kg)  Height: 5\' 7"  (1.702 m)  PainSc: 0-No pain   Body mass index is 18.48 kg/m.   Objective:  Physical Exam Constitutional:      General: She is not in acute  distress.    Appearance: Normal appearance.  Cardiovascular:     Rate and Rhythm: Normal rate and regular rhythm.     Pulses: Normal pulses.     Heart sounds: Normal heart sounds. No murmur heard.   Pulmonary:     Effort: Pulmonary effort is normal. No respiratory distress.     Breath sounds: Normal breath sounds. No wheezing.  Neurological:     General: No focal deficit present.     Mental Status: She is alert and oriented to person, place, and time.     Cranial Nerves: No cranial nerve deficit.     Motor: No weakness.  Psychiatric:        Mood and Affect: Mood normal.        Behavior: Behavior normal. Behavior is not agitated. Behavior is cooperative.        Thought Content: Thought content normal.        Cognition and Memory: She exhibits impaired  recent memory.        Judgment: Judgment is inappropriate (she can make basic decisions but does not realize her ability to live in an environment that does not have some supervision).         Assessment And Plan:     1. Altered mental status, unspecified altered mental status type  Today her mentation is a little better. She is able to hold on to conversation but I do not feel she can make significant decisions on her own without supervision. I do feel she can live alone with supervision.  She will be residing in an independent living facility  2. Murmur  She has noted murmur that I had not heard before, will send for Echo for further evaluation - ECHOCARDIOGRAM COMPLETE; Future  3. Screening for tuberculosis  Needs for her to move in to an independent living facility - [facility]  4. Declining functional status  Form completed for her admission to her independent living facility  She is using a rolling walker to ambulate     Patient was given opportunity to ask questions. Patient verbalized understanding of the plan and was able to repeat key elements of the plan. All questions were answered to their satisfaction.  Arnette Felts, FNP    I, Arnette Felts, FNP, have reviewed all documentation for this visit. The documentation on 06/03/20 for the exam, diagnosis, procedures, and orders are all accurate and complete.   IF YOU HAVE BEEN REFERRED TO A SPECIALIST, IT MAY TAKE 1-2 WEEKS TO SCHEDULE/PROCESS THE REFERRAL. IF YOU HAVE NOT HEARD FROM US/SPECIALIST IN TWO WEEKS, PLEASE GIVE [place] A CALL AT [phone].   THE PATIENT IS ENCOURAGED TO PRACTICE SOCIAL DISTANCING DUE TO THE COVID-19 PANDEMIC.

## 2020-06-04 ENCOUNTER — Other Ambulatory Visit: Payer: Self-pay | Admitting: Nurse Practitioner

## 2020-06-06 LAB — QUANTIFERON-TB GOLD PLUS
QuantiFERON Mitogen Value: >10 IU/mL
QuantiFERON Nil Value: 0.02 IU/mL
QuantiFERON TB1 Ag Value: 0.02 IU/mL
QuantiFERON TB2 Ag Value: 0.01 IU/mL
QuantiFERON-TB Gold Plus: NEGATIVE

## 2020-06-08 ENCOUNTER — Telehealth: Payer: Medicare HMO

## 2020-06-10 ENCOUNTER — Other Ambulatory Visit: Payer: Self-pay

## 2020-06-10 DIAGNOSIS — E039 Hypothyroidism, unspecified: Secondary | ICD-10-CM

## 2020-06-10 MED ORDER — LEVOTHYROXINE SODIUM 25 MCG PO TABS: 25.0000 ug | ORAL_TABLET | Freq: Every day | ORAL | 1 refills | Status: DC

## 2020-06-10 MED ORDER — AMLODIPINE BESYLATE 5 MG PO TABS: 5.0000 mg | ORAL_TABLET | Freq: Every day | ORAL | 1 refills | Status: DC

## 2020-06-10 MED ORDER — QUETIAPINE FUMARATE 25 MG PO TABS: 25.0000 mg | ORAL_TABLET | Freq: Every day | ORAL | 1 refills | Status: DC

## 2020-06-10 MED ORDER — GABAPENTIN 300 MG PO CAPS
900.0000 mg | ORAL_CAPSULE | Freq: Three times a day (TID) | ORAL | 3 refills | Status: DC
Start: 2020-06-10 — End: 2020-10-13

## 2020-06-17 ENCOUNTER — Ambulatory Visit: Payer: Medicare HMO | Admitting: Neurology

## 2020-06-17 ENCOUNTER — Telehealth: Payer: Self-pay | Admitting: Nurse Practitioner

## 2020-06-17 NOTE — Telephone Encounter (Signed)
Left message for patient to call back and schedule Medicare Annual Wellness Visit (AWV) either virtually or in office.   Last AWV 04/30/19  please schedule at anytime with [facility]    This should be a 45 minute visit. 

## 2020-06-18 ENCOUNTER — Telehealth: Payer: Medicare HMO

## 2020-08-05 ENCOUNTER — Ambulatory Visit: Payer: Medicare HMO | Admitting: Neurology

## 2020-08-10 ENCOUNTER — Other Ambulatory Visit: Payer: Self-pay

## 2020-08-10 ENCOUNTER — Emergency Department (HOSPITAL_COMMUNITY)
Admission: EM | Admit: 2020-08-10 | Discharge: 2020-08-11 | Disposition: A | Discharge status: Skilled Nursing Facility | Payer: Medicare HMO | Attending: Emergency Medicine | Admitting: Emergency Medicine

## 2020-08-10 ENCOUNTER — Emergency Department (HOSPITAL_COMMUNITY): Payer: Medicare HMO

## 2020-08-10 DIAGNOSIS — S0101XA Laceration without foreign body of scalp, initial encounter: Secondary | ICD-10-CM

## 2020-08-10 DIAGNOSIS — W19XXXA Unspecified fall, initial encounter: Secondary | ICD-10-CM | POA: Insufficient documentation

## 2020-08-10 DIAGNOSIS — M160 Bilateral primary osteoarthritis of hip: Secondary | ICD-10-CM | POA: Diagnosis not present

## 2020-08-10 DIAGNOSIS — Z87891 Personal history of nicotine dependence: Secondary | ICD-10-CM | POA: Diagnosis not present

## 2020-08-10 DIAGNOSIS — K449 Diaphragmatic hernia without obstruction or gangrene: Secondary | ICD-10-CM | POA: Diagnosis not present

## 2020-08-10 DIAGNOSIS — S0003XA Contusion of scalp, initial encounter: Secondary | ICD-10-CM | POA: Diagnosis not present

## 2020-08-10 DIAGNOSIS — S82831A Other fracture of upper and lower end of right fibula, initial encounter for closed fracture: Secondary | ICD-10-CM | POA: Insufficient documentation

## 2020-08-10 DIAGNOSIS — M533 Sacrococcygeal disorders, not elsewhere classified: Secondary | ICD-10-CM | POA: Diagnosis not present

## 2020-08-10 DIAGNOSIS — R58 Hemorrhage, not elsewhere classified: Secondary | ICD-10-CM | POA: Diagnosis not present

## 2020-08-10 DIAGNOSIS — M1711 Unilateral primary osteoarthritis, right knee: Secondary | ICD-10-CM | POA: Diagnosis not present

## 2020-08-10 DIAGNOSIS — M47812 Spondylosis without myelopathy or radiculopathy, cervical region: Secondary | ICD-10-CM | POA: Diagnosis not present

## 2020-08-10 DIAGNOSIS — S8991XA Unspecified injury of right lower leg, initial encounter: Secondary | ICD-10-CM | POA: Diagnosis present

## 2020-08-10 DIAGNOSIS — R21 Rash and other nonspecific skin eruption: Secondary | ICD-10-CM | POA: Diagnosis not present

## 2020-08-10 DIAGNOSIS — Z043 Encounter for examination and observation following other accident: Secondary | ICD-10-CM | POA: Diagnosis not present

## 2020-08-10 DIAGNOSIS — S89301A Unspecified physeal fracture of lower end of right fibula, initial encounter for closed fracture: Secondary | ICD-10-CM | POA: Diagnosis not present

## 2020-08-10 DIAGNOSIS — I959 Hypotension, unspecified: Secondary | ICD-10-CM | POA: Diagnosis not present

## 2020-08-10 DIAGNOSIS — M25571 Pain in right ankle and joints of right foot: Secondary | ICD-10-CM | POA: Diagnosis not present

## 2020-08-10 DIAGNOSIS — R0902 Hypoxemia: Secondary | ICD-10-CM | POA: Diagnosis not present

## 2020-08-10 LAB — CBC WITH DIFFERENTIAL/PLATELET
Abs Immature Granulocytes: 0.03 10*3/uL (ref 0.00–0.07)
Basophils Absolute: 0 10*3/uL (ref 0.0–0.1)
Basophils Relative: 0 %
Eosinophils Absolute: 0 10*3/uL (ref 0.0–0.5)
Eosinophils Relative: 0 %
HCT: 38.5 % (ref 36.0–46.0)
Hemoglobin: 12.2 g/dL (ref 12.0–15.0)
Immature Granulocytes: 0 %
Lymphocytes Relative: 8 %
Lymphs Abs: 0.8 10*3/uL (ref 0.7–4.0)
MCH: 32.2 pg (ref 26.0–34.0)
MCHC: 31.7 g/dL (ref 30.0–36.0)
MCV: 101.6 fL — ABNORMAL HIGH (ref 80.0–100.0)
Monocytes Absolute: 0.8 10*3/uL (ref 0.1–1.0)
Monocytes Relative: 7 %
Neutro Abs: 9.1 10*3/uL — ABNORMAL HIGH (ref 1.7–7.7)
Neutrophils Relative %: 85 %
Platelets: 216 10*3/uL (ref 150–400)
RBC: 3.79 MIL/uL — ABNORMAL LOW (ref 3.87–5.11)
RDW: 14.3 % (ref 11.5–15.5)
WBC: 10.7 10*3/uL — ABNORMAL HIGH (ref 4.0–10.5)
nRBC: 0 % (ref 0.0–0.2)

## 2020-08-10 LAB — COMPREHENSIVE METABOLIC PANEL
ALT: 13 U/L (ref 0–44)
AST: 25 U/L (ref 15–41)
Albumin: 3.2 g/dL — ABNORMAL LOW (ref 3.5–5.0)
Alkaline Phosphatase: 48 U/L (ref 38–126)
Anion gap: 13 (ref 5–15)
BUN: 20 mg/dL (ref 8–23)
CO2: 23 mmol/L (ref 22–32)
Calcium: 9.4 mg/dL (ref 8.9–10.3)
Chloride: 107 mmol/L (ref 98–111)
Creatinine, Ser: 1.32 mg/dL — ABNORMAL HIGH (ref 0.44–1.00)
GFR, Estimated: 40 mL/min — ABNORMAL LOW (ref >60)
Glucose, Bld: 113 mg/dL — ABNORMAL HIGH (ref 70–99)
Potassium: 4.2 mmol/L (ref 3.5–5.1)
Sodium: 143 mmol/L (ref 135–145)
Total Bilirubin: 1.1 mg/dL (ref 0.3–1.2)
Total Protein: 5.8 g/dL — ABNORMAL LOW (ref 6.5–8.1)

## 2020-08-10 LAB — PROTIME-INR
INR: 1.1 (ref 0.8–1.2)
Prothrombin Time: 14.2 seconds (ref 11.4–15.2)

## 2020-08-10 MED ORDER — LIDOCAINE-EPINEPHRINE (PF) 2 %-1:200000 IJ SOLN
10.0000 mL | Freq: Once | INTRAMUSCULAR | Status: DC
  Filled 2020-08-10: qty 20

## 2020-08-10 NOTE — ED Provider Notes (Signed)
Emergency Department Provider Note   I have reviewed the triage vital signs and the nursing notes.   HISTORY  Chief Complaint Fall (Hematoma/LAC to R posterior region)   HPI Amy Whitehead is a 87 y.o. female with past medical history reviewed below presents to the emergency department from her her nursing facility after a fall with head trauma.  EMS is unsure regarding the patient's anticoagulation status.  Patient denies any anticoagulation.  She tells me that she "just fell" but did not pass out. No CP, SOB, or pain in the arms/legs. There is a large amount of dried blood but no active bleeding noted. Denies HA. Denies neck/back pain. No numbness/weakness.    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  Unsure if uterus was removed, but menses stopped after surgery    Allergies Patient has no known allergies.  Family History  Problem Relation Age of Onset   Cancer Father        brain tumor   Cancer Sister        lung   Diabetes Sister     Social History Social History   Tobacco Use   Smoking status: Former    Pack years: 0.00    Types: Cigarettes    Quit date: 04/16/1962    Years since quitting: 58.3   Smokeless tobacco: Never   Tobacco comments:    No plans to start again  Vaping Use   Vaping Use: Never used  Substance Use Topics   Alcohol use: No   Drug use: No    Review of Systems  Constitutional: No fever/chills Cardiovascular: Denies chest pain. Respiratory: Denies  shortness of breath. Gastrointestinal: No abdominal pain.  No nausea, no vomiting.  No diarrhea.  Musculoskeletal: Negative for back pain. Skin: Negative for rash. Positive head injury with bleeding.  Neurological: Negative for headaches, focal weakness or numbness.  10-point ROS otherwise negative.  ____________________________________________   PHYSICAL EXAM:  VITAL SIGNS: ED Triage Vitals [08/10/20 2018]  Enc Vitals Group     BP (!) 116/57     Pulse Rate 62     Resp 16     Temp 98.4 F (36.9 C)     Temp Source Oral     SpO2 95 %    Constitutional: Alert and oriented. Well appearing and in no acute distress. Eyes: Conjunctivae are normal. Head: Dried blood and hematoma to the the right lateral/occipital scalp. Laceration is "T" shaped.  Nose: No congestion/rhinnorhea. Mouth/Throat: Mucous membranes are moist.  Neck: No stridor. C collar in place. No midline tenderness.  Cardiovascular: Normal rate, regular rhythm. Good peripheral circulation. Grossly normal heart sounds.   Respiratory: Normal respiratory effort.  No retractions. Lungs CTAB. Gastrointestinal: Soft and nontender. No distention.  Musculoskeletal: Bruising to the right ankle. No nee tenderness. Normal passive ROM of bilateral hips.  Neurologic:  Normal speech and language. No gross focal neurologic deficits are appreciated.  Skin:  Skin is warm,  dry and intact. No rash noted.   ____________________________________________   LABS (all labs ordered are listed, but only abnormal results are displayed)  Labs Reviewed  COMPREHENSIVE METABOLIC PANEL - Abnormal; Notable for the following components:      Result Value   Glucose, Bld 113 (*)    Creatinine, Ser 1.32 (*)    Total Protein 5.8 (*)    Albumin 3.2 (*)    GFR, Estimated 40 (*)    All other components within normal limits  CBC WITH DIFFERENTIAL/PLATELET - Abnormal; Notable for the following components:   WBC 10.7 (*)    RBC 3.79 (*)    MCV 101.6  (*)    Neutro Abs 9.1 (*)    All other components within normal limits  PROTIME-INR   ____________________________________________  RADIOLOGY  DG Chest 1 View  Result Date: 08/10/2020 CLINICAL DATA:  87 year old female with fall. EXAM: CHEST  1 VIEW COMPARISON:  Chest radiograph dated 01/11/2020. FINDINGS: Evaluation is limited due to superimposition of the left are over the chest. No focal consolidation, pleural effusion, or pneumothorax. Moderate size hiatal hernia. The cardiac silhouette is within limits. No acute osseous pathology. IMPRESSION: No acute cardiopulmonary process. Electronically Signed   By: Elgie Collard M.D.   On: 08/10/2020 21:12   DG Pelvis 1-2 Views  Result Date: 08/10/2020 CLINICAL DATA:  Pain following fall EXAM: PELVIS - 1-2 VIEW COMPARISON:  None. FINDINGS: There is no evidence of pelvic fracture or dislocation. There is severe narrowing of the right hip joint with moderate narrowing of the left hip joint. There is a chondral cystic change on the right. No erosive change. There is slight osteoarthritic change in the sacroiliac joints. IMPRESSION: No fracture or dislocation. 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 Electronically Signed   By: Bretta Bang III M.D.   On: 08/10/2020 21:06   DG Ankle Complete Right  Result Date: 08/10/2020 CLINICAL DATA:  Pain following fall fall EXAM: RIGHT ANKLE - COMPLETE 3+ VIEW COMPARISON:  None. FINDINGS: Frontal, oblique, and lateral views were obtained. There is a fracture at the level of the distal physis of the fibula with slight displacement of fracture fragments. No other appreciable fracture. No joint effusion. There is no appreciable joint space narrowing or erosion. Ankle mortise appears intact IMPRESSION: Mildly displaced fracture at the level of the distal fibular physis. No other fracture. No appreciable joint space narrowing. Ankle mortise appears intact.  Electronically Signed   By: Bretta Bang III M.D.   On: 08/10/2020 21:08   CT Head Wo Contrast  Result Date: 08/10/2020 CLINICAL DATA:  Unwitnessed and un remembered fall. Hematoma and laceration to the posterior right side of head. Cervical collar. EXAM: CT HEAD WITHOUT CONTRAST CT CERVICAL SPINE WITHOUT CONTRAST TECHNIQUE: Multidetector CT imaging of the head and cervical spine was performed following the standard protocol without intravenous contrast. Multiplanar CT image reconstructions of the cervical spine were also generated. COMPARISON:  CT head 01/11/2020 FINDINGS: CT HEAD FINDINGS Brain: No evidence of acute infarction, hemorrhage, hydrocephalus, extra-axial collection or mass lesion/mass effect. Diffuse cerebral atrophy. Low-attenuation changes in the deep white matter consistent with small vessel ischemia. Vascular: Moderate intracranial arterial vascular calcifications. Skull: Calvarium appears intact. Sinuses/Orbits: Paranasal sinuses and mastoid air cells are clear. Other: Moderate subcutaneous scalp hematoma over the right posterior parietal region. CT CERVICAL SPINE FINDINGS Alignment: Normal. Skull base and vertebrae: No acute fracture. No primary bone lesion or focal pathologic  process. Soft tissues and spinal canal: No prevertebral fluid or swelling. No visible canal hematoma. Disc levels: Degenerative changes with disc space narrowing and endplate osteophyte formation throughout. Most prominent in the lower cervical region. Degenerative changes in the facet joints. Upper chest: Visualized lung apices are clear. Other: None. IMPRESSION: 1. No acute intracranial abnormalities. 2. Degenerative changes in the cervical spine. Normal alignment. No acute displaced fractures identified. Electronically Signed   By: Burman NievesWilliam  Stevens M.D.   On: 08/10/2020 23:30   CT Cervical Spine Wo Contrast  Result Date: 08/10/2020 CLINICAL DATA:  Unwitnessed and un remembered fall. Hematoma and laceration  to the posterior right side of head. Cervical collar. EXAM: CT HEAD WITHOUT CONTRAST CT CERVICAL SPINE WITHOUT CONTRAST TECHNIQUE: Multidetector CT imaging of the head and cervical spine was performed following the standard protocol without intravenous contrast. Multiplanar CT image reconstructions of the cervical spine were also generated. COMPARISON:  CT head 01/11/2020 FINDINGS: CT HEAD FINDINGS Brain: No evidence of acute infarction, hemorrhage, hydrocephalus, extra-axial collection or mass lesion/mass effect. Diffuse cerebral atrophy. Low-attenuation changes in the deep white matter consistent with small vessel ischemia. Vascular: Moderate intracranial arterial vascular calcifications. Skull: Calvarium appears intact. Sinuses/Orbits: Paranasal sinuses and mastoid air cells are clear. Other: Moderate subcutaneous scalp hematoma over the right posterior parietal region. CT CERVICAL SPINE FINDINGS Alignment: Normal. Skull base and vertebrae: No acute fracture. No primary bone lesion or focal pathologic process. Soft tissues and spinal canal: No prevertebral fluid or swelling. No visible canal hematoma. Disc levels: Degenerative changes with disc space narrowing and endplate osteophyte formation throughout. Most prominent in the lower cervical region. Degenerative changes in the facet joints. Upper chest: Visualized lung apices are clear. Other: None. IMPRESSION: 1. No acute intracranial abnormalities. 2. Degenerative changes in the cervical spine. Normal alignment. No acute displaced fractures identified. Electronically Signed   By: Burman NievesWilliam  Stevens M.D.   On: 08/10/2020 23:30   DG Knee Complete 4 Views Right  Result Date: 08/10/2020 CLINICAL DATA:  Pain following fall EXAM: RIGHT KNEE - COMPLETE 4+ VIEW COMPARISON:  None. FINDINGS: Frontal, lateral, and bilateral oblique views obtained. No fracture or dislocation. No joint effusion. There is moderate narrowing of the patellofemoral joint. Other joint spaces  appear unremarkable. There is spurring along the posterior patella. No erosive change. IMPRESSION: Osteoarthritic change in the patellofemoral joint. No fracture or dislocation. No appreciable joint effusion. Electronically Signed   By: Bretta BangWilliam  Woodruff III M.D.   On: 08/10/2020 21:07    ____________________________________________   PROCEDURES  Procedure(s) performed:   Marland Kitchen.Marland Kitchen.Laceration Repair  Date/Time: 08/10/2020 11:54 PM Performed by: Maia PlanLong, Adriann Thau G, MD Authorized by: Maia PlanLong, Rykker Coviello G, MD   Consent:    Consent obtained:  Verbal   Consent given by:  Patient and guardian   Risks, benefits, and alternatives were discussed: yes     Risks discussed:  Infection, poor wound healing, poor cosmetic result, pain, retained foreign body and vascular damage   Alternatives discussed:  No treatment Universal protocol:    Patient identity confirmed:  Verbally with patient Anesthesia:    Anesthesia method:  Local infiltration   Local anesthetic:  Lidocaine 2% WITH epi Laceration details:    Location:  Scalp   Scalp location:  Occipital   Length (cm):  5   Depth (mm):  5 Pre-procedure details:    Preparation:  Patient was prepped and draped in usual sterile fashion and imaging obtained to evaluate for foreign bodies Exploration:    Limited defect created (wound  extended): no     Hemostasis achieved with:  Direct pressure   Imaging obtained comment:  CT head   Imaging outcome: foreign body not noted     Wound exploration: wound explored through full range of motion and entire depth of wound visualized     Wound extent: no foreign bodies/material noted, no muscle damage noted, no underlying fracture noted and no vascular damage noted     Contaminated: no   Treatment:    Area cleansed with:  Povidone-iodine and saline   Amount of cleaning:  Standard   Irrigation solution:  Sterile saline   Irrigation volume:  500   Visualized foreign bodies/material removed: no     Debridement:  Minimal    Undermining:  None   Scar revision: no   Skin repair:    Repair method:  Staples   Number of staples:  6 Approximation:    Approximation:  Close Repair type:    Repair type:  Intermediate Post-procedure details:    Dressing:  Open (no dressing)   Procedure completion:  Tolerated well, no immediate complications   ____________________________________________   INITIAL IMPRESSION / ASSESSMENT AND PLAN / ED COURSE  Pertinent labs & imaging results that were available during my care of the patient were reviewed by me and considered in my medical decision making (see chart for details).   Patient presents emergency department for evaluation after fall with head injury and scalp laceration.  Wound is hemostatic.  Unknown anticoagulation status but patient denies anticoagulation.  Aspirin is listed in the patient's medical history but no anticoagulation noted.  Will not activate as a trauma in this setting.  Patient does have some swelling and tenderness over the right ankle as well.  Plan for plain films along with CT imaging, labs, reassess.  Patient with isolated distal fibula fx with stable ankle mortise. Will WBAT and place in CAM walker.   10:39 PM  Spoke with the patient's daughter by phone.  Confirmed that she is in assisted living at [facility].  She uses a walker and has assistance there with ADLs. She does not live independently. Does not require a wheelchair. CT head and c-spine are pending. Will clean scalp wound. Patient will likely require repair.   CT reviewed. No acute findings. Laceration cleaned and repaired. Will need staple removing in 7 days. Will also require ortho follow up in in the coming week. WBAT. Discussed with family by phone and provided in writing.  ____________________________________________  FINAL CLINICAL IMPRESSION(S) / ED DIAGNOSES  Final diagnoses:  Fall, initial encounter  Laceration of scalp, initial encounter  Closed fracture of distal end of  right fibula, unspecified fracture morphology, initial encounter     MEDICATIONS GIVEN DURING THIS VISIT:  Medications  lidocaine-EPINEPHrine (XYLOCAINE W/EPI) 2 %-1:200000 (PF) injection 10 mL (10 mLs Infiltration See Procedure Record 08/10/20 2344)    Note:  This document was prepared using Dragon voice recognition software and may include unintentional dictation errors.  Alona Bene, MD, [facility]    Neev Mcmains, Arlyss Repress, MD 08/11/20 [phone]

## 2020-08-10 NOTE — ED Triage Notes (Signed)
Pt was last seen around approximately 1600 today. Pt has a hematoma/LAC to posterior R side of head, and pain to R ankle  Pt does not remember the fall. Pt is AAOx2. Pt has hx of dementia.

## 2020-08-10 NOTE — Progress Notes (Signed)
Orthopedic Tech Progress Note Patient Details:  Amy Whitehead 08-27-1933 [id number]  Ortho Devices Type of Ortho Device: CAM walker Ortho Device/Splint Location: Right Lower Extremity Ortho Device/Splint Interventions: Ordered, Application, Adjustment   Post Interventions Patient Tolerated: Well Instructions Provided: Adjustment of device, Care of device, Poper ambulation with device  Milena Liggett P Harle Stanford 08/10/2020, 10:32 PM

## 2020-08-11 DIAGNOSIS — R410 Disorientation, unspecified: Secondary | ICD-10-CM | POA: Diagnosis not present

## 2020-08-11 DIAGNOSIS — Z7401 Bed confinement status: Secondary | ICD-10-CM | POA: Diagnosis not present

## 2020-08-11 NOTE — ED Notes (Signed)
Secretary given paperwork to call [facility].

## 2020-08-11 NOTE — Discharge Instructions (Addendum)
You were seen in the emergency room today after a fall.  You sustained a scalp laceration which was repaired with 6 staples.  This will need to be removed in 1 week.  You can return to the emergency department for this or see your primary care doctor if they feel comfortable removing the sutures.  Clean gently around the wound and keep it clean and dry.  If you notice redness, drainage from the wound this could be sign of infection and he should follow with the primary care doctor.  You also have a small fracture to the right ankle.  We have placed you in a boot.  Please follow closely with the orthopedic doctor by calling tomorrow to schedule a follow-up appointment in the next week.  You may take Tylenol as needed for pain.

## 2020-08-11 NOTE — ED Notes (Signed)
Called [facility] to pick up pt

## 2020-08-12 ENCOUNTER — Other Ambulatory Visit: Payer: Self-pay

## 2020-08-12 ENCOUNTER — Ambulatory Visit (INDEPENDENT_AMBULATORY_CARE_PROVIDER_SITE_OTHER): Payer: Medicare HMO | Admitting: Neurology

## 2020-08-12 ENCOUNTER — Encounter: Payer: Self-pay | Admitting: Neurology

## 2020-08-12 VITALS — BP 117/67 | HR 71 | Ht 67.0 in | Wt 109.0 lb

## 2020-08-12 DIAGNOSIS — Z113 Encounter for screening for infections with a predominantly sexual mode of transmission: Secondary | ICD-10-CM | POA: Diagnosis not present

## 2020-08-12 DIAGNOSIS — R413 Other amnesia: Secondary | ICD-10-CM | POA: Insufficient documentation

## 2020-08-12 DIAGNOSIS — R799 Abnormal finding of blood chemistry, unspecified: Secondary | ICD-10-CM | POA: Diagnosis not present

## 2020-08-12 DIAGNOSIS — F0391 Unspecified dementia with behavioral disturbance: Secondary | ICD-10-CM

## 2020-08-12 DIAGNOSIS — R296 Repeated falls: Secondary | ICD-10-CM | POA: Diagnosis not present

## 2020-08-12 DIAGNOSIS — G309 Alzheimer's disease, unspecified: Secondary | ICD-10-CM | POA: Insufficient documentation

## 2020-08-12 DIAGNOSIS — N183 Chronic kidney disease, stage 3 unspecified: Secondary | ICD-10-CM | POA: Diagnosis not present

## 2020-08-12 DIAGNOSIS — F03918 Unspecified dementia, unspecified severity, with other behavioral disturbance: Secondary | ICD-10-CM | POA: Insufficient documentation

## 2020-08-12 DIAGNOSIS — R410 Disorientation, unspecified: Secondary | ICD-10-CM

## 2020-08-12 NOTE — Patient Instructions (Signed)
Needs reminders for food and fluid intake.  Moving to a memory unit may be needed.    Alzheimer's Disease Caregiver Guide Alzheimer's disease is a condition that makes a person: Forget things. Act differently. Have trouble paying attention and doing simple tasks. These things get worse with time. The tips below can help you care for theperson. How to help manage lifestyle changes Tips to help with symptoms Be calm and patient. Give simple, short answers to questions. Avoid correcting the person in a negative way. Try not to take things personally, even if the person forgets your name. Do not argue with the person. This may make the person more upset. Tips to lessen frustration Make appointments and do daily tasks when the person is at his or her best. Take your time. Simple tasks may take longer. Allow plenty of time to complete tasks. Limit choices for the person. Involve the person in what you are doing. Keep things organized: Keep a daily routine. Organize medicines in a pillbox for each day of the week. Keep a calendar in a central location to remind the person of meetings or other activities. Avoid new or crowded places, if possible. Use simple words, short sentences, and a calm voice. Only give one direction at a time. Buy clothes and shoes that are easy to put on and take off. Try to change the subject if the person becomes frustrated or angry. Tips to prevent injury  Keep floors clear. Remove rugs, magazine racks, and floor lamps. Keep hallways well-lit. Put a handrail and non-slip mat in the bathtub or shower. Put childproof locks on cabinets that have dangerous items in them. These items include medicine, alcohol, guns, toxic cleaning items, sharp tools, matches, and lighters. Put locks on doors where the person cannot see or reach them. This helps keep the person from going out of the house and getting lost. Be ready for emergencies. Keep a list of emergency phone  numbers and addresses close by. Remove car keys and lock garage doors so that the person does not try to drive. Bracelets may be worn that track location and identify the person as having memory problems. This should be worn at all times for safety.  Tips for the future  Discuss financial and legal planning early. People with this disease have trouble managing their money as the disease gets worse. Get help from a professional. Talk about advance directives, safety, and daily care. Take these steps: Create a living will and choose a power of attorney. This is someone who can make decisions for the person with Alzheimer's disease when he or she can no longer do so. Discuss driving safety and when to stop driving. The person's doctor can help with this. If the person lives alone, make sure he or she is safe. Some people need extra help at home. Other people need more care at a nursing home or care center.  How to recognize changes in the person's condition With this disease, memory problems and confusion slowly get worse. In time, theperson may not know his or her friends and family members. The disease can also cause changes in behavior and mood, such as anxiety or anger. The person may see, hear, taste, smell, or feel things that are not real (hallucinate). These changes can come on all of a sudden. They may happen in response to something such as: Pain. An infection. Changes in temperature or noise. Too much stimulation. Feeling lost or scared. Medicines. Where to find support Find  out about services that can provide short-term care (respite care). These can allow you to take a break when you need it. Join a support group near you. These groups can help you: Learn ways to manage stress. Share experiences with others. Get emotional comfort and support. Learn about caregiving as the disease gets worse. Know what community resources are available. Where to find more  information Alzheimer's Association: LimitLaws.hu Contact a doctor if: The person has a fever. The person has a sudden behavior change that does not get better with calming strategies. The person is not able to take care of himself or herself at home. You are no longer able to care for the person. Get help right away if: The person has a sudden increase in confusion or new hallucinations. The person threatens you or anyone else, including himself or herself. Get help right away if you feel like your loved one may hurt himself or herself or others, or has thoughts about taking his or her own life. Go to your nearest emergency room or: Call your local emergency services (911 in the [place] Call the National Suicide Prevention Lifeline at (980)120-3441. This is open 24 hours a day. Text the Crisis Text Line at 216-634-9096. Summary Alzheimer's disease causes a person to forget things. A person who has this condition may have trouble doing simple tasks. Take steps to keep the person from getting hurt. Plan for future care. You can find support by joining a support group near you. This information is not intended to replace advice given to you by your health care provider. Make sure you discuss any questions you have with your healthcare provider. Document Revised: 06/02/2019 Document Reviewed: 06/02/2019 Elsevier Patient Education  2022 Elsevier Inc. Dementia Dementia is a condition that affects the way the brain works. It often affects memory and thinking. There are many types of dementia. Some types get worse with time and cannot be reversed. Some types of dementia include: Alzheimer's disease. This is the most common type. Vascular dementia. This type may happen due to a stroke. Lewy body dementia. This type may happen to people who have Parkinson's disease. Frontotemporal dementia. This type is caused by damage to nerve cells in certain parts of the brain. Some people may have more than one  type. What are the causes? This condition is caused by damage to cells in the brain. Some causes that cannot be reversed include: Having a condition that affects the blood vessels of the brain, such as diabetes, heart disease, or blood vessel disease. Changes to genes. Some causes that can be reversed or slowed include: Injury to the brain. Certain medicines. Infection. Not having enough vitamin B12 in the body, or thyroid problems. A tumor, blood clot, or too much fluid in the brain. Certain diseases that cause your body's defense system (immune system) to attack healthy parts of the body. What are the signs or symptoms? Problems remembering events or people. Having trouble taking a bath or putting clothes on. Forgetting appointments. Forgetting to pay bills. Trouble planning and making meals. Having trouble speaking. Getting lost easily. Changes in behavior or mood. How is this treated? Treatment depends on the cause of the dementia. It might include: Taking medicines for symptoms or to help control or slow down the dementia. Treating the cause of your dementia. Your doctor can help you find support groups and other doctors who can helpwith your care. Follow these instructions at home: Medicines Take over-the-counter and prescription medicines only as told by  your doctor. Use a pill organizer to help you manage your medicines. Avoidtaking medicines for pain or for sleep. Lifestyle Make healthy choices: Be active as told by your doctor. Do not smoke or use any products that contain nicotine or tobacco. If you need help quitting, ask your doctor. Do not drink alcohol. When you get stressed, do something that will help you relax. Your doctor can give you tips. Spend time with other people. Make sure you get good sleep. To get good sleep: Try not to take naps during the day. Keep your bedroom dark and cool. In the few hours before you go to bed, try not to do any exercise. Do  not have foods and drinks with caffeine at night. Eating and drinking Drink enough fluid to keep your pee (urine) pale yellow. Eat a healthy diet. General instructions  Talk with your doctor to figure out: What you need help with. What your safety needs are. Ask your doctor if it is safe for you to drive. If told, wear a bracelet that tracks where you are or shows that you are a person with memory loss. Work with your family to make big decisions. Keep all follow-up visits.  Where to find more information Alzheimer's Association: LimitLaws.hu [employer] on Aging: CashCowGambling.be World Health Organization: https://castaneda-walker.com/ Contact a doctor if: You have any new symptoms. Your symptoms get worse. You have problems with swallowing or choking. Get help right away if: You feel very sad, or feel that you want to harm yourself. Your family members are worried for your safety. Get help right away if you feel like you may hurt yourself or others, or have thoughts about taking your own life. Go to your nearest emergency room or: Call your local emergency services (911 in the [place] Call the National Suicide Prevention Lifeline at 618-014-2113. This is open 24 hours a day. Text the Crisis Text Line at 507-266-4216. Summary Dementia often affects memory and thinking. Some types of dementia get worse with time and cannot be reversed. Treatment for this condition depends on the cause. Talk with your doctor to figure out what you need help with. Your doctor can help you find support groups and other doctors who can help with your care. This information is not intended to replace advice given to you by your health care provider. Make sure you discuss any questions you have with your healthcare provider. Document Revised: 06/30/2019 Document Reviewed: 06/30/2019 Elsevier Patient Education  2022 [employer].

## 2020-08-12 NOTE — Progress Notes (Signed)
Provider:  Melvyn Novas, M D  Referring Provider: Arnette Felts, FNP Primary Care Physician:  Arnette Felts, FNP  Chief Complaint  Patient presents with   Follow-up    Pt with daughter, she lives 3 hrs away and started noticing mental decline. in nov episode where she was knocked down by her dog and fell in hospital for 9 days and They released her needing PT with [facility]. In April she was moved to [facility]. She has mental status decline since all of this. She gets easily angry and combative at time. She recently had another fall and was in ER 2 days ago. Stitches to head.     HPI:  Amy Whitehead is a 87 y.o. female is seen here upon PCP referral, FIRST VISIT, for memory loss. 08-12-2020, First fall in November 2021, was in Rehab at [facility].  Her daughter reports she had again fallen at [facility] and was called from the ED, just 2 days ago. Amy Whitehead is very quiet during this visit, her daughter told me that she lives about 3 hours away she has the power of attorney and healthcare power.  She has noted a mental status decline since her very first fall in November of last year and since she had recently another fall this time injuring the right forehead.  Stitches were reviewed and she also had additionally a wound on the occiput requiring another 6 stitches.  Her primary care provider's note was from April 01, 2020, the patient was last seen in April of this year.  She had no follow-up with primary care yet.  Her past medical history only with arthritis cataracts and postherpetic neuralgia.  Family history: The patient's mother died a day after her 89th birthday, as far as the family knows there was no dementia.  Her father died when she was 3 years old and was in his mid 68s, he died of a brain tumor. Amy Whitehead has 3 living children 2 brothers and the youngest her daughter, 1 brother one of the brothers has a seizure disorder.  He is the middle child.  They have had no contact  with her mother in years.  As I conveyed from the primary care notes she had persistent and progressive cognitive impairment which led to a referral to neurology in spring of this year, TSH vitamin B12 RPR were already checked, she was switched wired to live in a nursing facility but as I understand it was not the memory unit.  Her functional status however has significantly declined as well as her ambulatory abilities.  In November her diagnosis was not just a fall but an encephalopathy related to cellulitis and treatment with antibiotics-that sepsis.  Until that time she had lived by herself and care for herself.  She was scheduled for Dr Terrace Arabia to be seen 06-17-2020, but the visit was cancelled by our office - unclear why.    Her ED visit note from the 14th, showed that she had elevated creatinine at 1.32 total protein was 5.8 GFR was estimated to be 40 but these are not necessarily reliable values.  Her heart rate was 62 it does not say he has regular or not high blood pressure was had been at 16/57 so rather on the low side I do miss a BUN to tell me if the patient was dehydrated.  MCV was 101.6 which is also not.  Red blood cell count was 3.79K.  X-rays for the ankle mildly displaced fracture  at the level of the distal fibula fuses no other fracture pelvis no fracture or dislocation, chest normal cardiac silhouette no rib fracture.  CT of the head without contrast hematoma and laceration to the outside scalp no bleed CT of the cervical spine normal. Amy Whitehead  did not remember her fall and the fall was not witnessed.          Review of Systems: Out of a complete 14 system review, the patient complains of only the following symptoms, and all other reviewed systems are negative.  None per patient .    MMSE - Mini Mental State Exam 08/12/2020 12/20/2017  Orientation to time 2 5  Orientation to Place 4 5  Registration 3 3  Attention/ Calculation 5 5  Recall 0 3  Language- name 2 objects 2 2   Language- repeat 1 1  Language- follow 3 step command 2 3  Language- read & follow direction 1 1  Write a sentence 1 1  Copy design 0 1  Total score 21 30      Social History   Socioeconomic History   Marital status: Widowed    Spouse name: Not on file   Number of children: 3   Years of education: 66   Highest education level: 12th grade  Occupational History   Occupation: order entry/ key punch   Occupation: retired  Tobacco Use   Smoking status: Former    Pack years: 0.00    Types: Cigarettes    Quit date: 04/16/1962    Years since quitting: 58.3   Smokeless tobacco: Never   Tobacco comments:    No plans to start again  Vaping Use   Vaping Use: Never used  Substance and Sexual Activity   Alcohol use: No   Drug use: No   Sexual activity: Never  Other Topics Concern   Not on file  Social History Narrative   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 High school graduate.  Lives alone. Has one dog, a Boston terrier named [place]. Walks daily for about an hour. Has 2 sons (one in [place], one in [place]), strained relationship with them.  Son in prison. Has a daughter and 2 grandchildren, age 5 and 71, who live in [place] area.  1 great-grand child . Strained relationship with son-in-law, who smokes and drinks.  Single, but helps a friend who currently has dementia.  Lives alone.  Drives.  Exercises by walking, especially when weather is nice.      [facility]ives in [facility] at [facility].        Diet: eats fruit, vegetables, and some meat.   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  Unsure if uterus was removed, but menses stopped after surgery    Current Outpatient Medications  Medication Sig Dispense Refill   acetaminophen (TYLENOL) 500 MG tablet Take 500 mg by mouth every 6 (six) hours as needed.     amLODipine (NORVASC) 5 MG tablet Take 1 tablet (5 mg total) by mouth daily. 90 tablet 1   aspirin EC 81 MG tablet Take 81 mg by mouth daily.     Calcium-Vitamin D (CALTRATE 600 PLUS-VIT D PO) Take 2 tablets by mouth 2 (two) times daily.     gabapentin (NEURONTIN) 300 MG capsule Take 3 capsules (900 mg total) by mouth 3 (three) times daily. 270 capsule 3   levothyroxine (SYNTHROID) 25 MCG tablet Take 1 tablet (25 mcg total) by mouth daily before breakfast. 90 tablet 1   Multiple Vitamin (MULTIVITAMIN WITH MINERALS) TABS tablet Take 1 tablet by mouth daily.     polyethylene glycol powder (MIRALAX) 17 GM/SCOOP powder Take 17 g by mouth 2 (two) times daily as needed for moderate constipation. 255 g 0   QUEtiapine (SEROQUEL) 25 MG tablet Take 1 tablet (25 mg total) by mouth at bedtime. 90 tablet 1   docusate sodium (COLACE) 100 MG capsule Take 1 capsule (100 mg total) by mouth daily as needed for mild constipation. (Patient not taking: Reported on 08/12/2020) 60 capsule 2   No current facility-administered medications for this visit.    Allergies as of 08/12/2020   (No Known Allergies)    Vitals: BP 117/67   Pulse 71   Ht 5\' 7"  (1.702 m)   Wt 109 lb (49.4 kg)   BMI 17.07 kg/m  Last Weight:  Wt Readings from Last 1 Encounters:  08/12/20 109 lb (49.4 kg)   Last Height:   Ht Readings from Last 1 Encounters:  08/12/20 5\' 7"  (1.702 m)    Physical exam:  General: The patient is awake, alert and appears not in  acute distress. The patient is well groomed. Head: Normocephalic, atraumatic. Neck is supple. Mallampati1, neck circumference:13 Cardiovascular:  Regular rate and rhythm, without  murmurs or carotid bruit, and without distended neck veins. Respiratory: Lungs are clear to auscultation. Skin:  Without evidence of edema, or rash Trunk: BMI is low   Neurologic exam : The patient is awake and alert, oriented to place and time.  Memory subjective described as intact.  There are significant deficits the patient is not aware off- Speech is non-fluent with dysphonia and aphasia. Mood and affect are appropriate.  Cranial nerves: Pupils are equal and briskly reactive to light. Funduscopic exam deferred. . Extraocular movements  in vertical and horizontal planes intact and without nystagmus.  Visual fields by finger perimetry are intact. Instructed many times to follow finger with her eyes, but has trouble to adhere.  Hearing to finger rub intact.  Facial sensation intact to fine touch. Facial motor strength is symmetric and tongue and uvula move midline. Tongue protrusion into either cheek is normal. Shoulder shrug is normal.   Motor exam:   Normal tone ,significant muscle bulk loss, weight loss, covered in petechiae.  and symmetric strength in all extremities. Surprisingly strong grip.   Sensory:  Fine touch, pinprick and vibration were tested in all extremities. She is unable to cooperate .  Proprioception was impaired   Coordination: Rapid alternating movements in the fingers/hands were slowed, incomplete.  Finger-to-nose maneuver  normal with evidence of ataxia, dysmetria but no  tremor.  Gait and station: Patient walks with a walker as  assistive device .  Stance is stable and normal.  Deep tendon reflexes: in the  upper and lower extremities are symmetric and intact. Babinski maneuver response is upgoing ,bilaterally.  Component Ref Range & Units 3 mo ago 4 mo ago  TSH 0.450 - 4.500 uIU/mL  9.570 High   6.070 High    [facility]ing Agency  [facility]  T4 and T3 were normal.   Low RBC, high WBC.  BUN 20, Creatinine 1.32 , CKD 3b Low protein and albumin-     Assessment:  After physical and neurologic examination, review of laboratory studies, imaging, neurophysiology testing and pre-existing records, assessment is that of :  Rapid progression of short term memory loss, visual spatial orientation, and paranoid ideas- "someone pushed me ". Malnourished? Dehydrated.  She needs reminders for meal times and likely someone to bring her to the  meal.  Hypothyroidism.   Plan:  Treatment plan and additional workup :  Dementia work up ,   MRI brain non contrast, CKD 3 b or more. July date.  Orthostatic BP with PCP or cardio. Dementia labs, checked already drawn labs, per PCP.  Awaiting cardiology notes      Melvyn NovasCarmen Stephanine Reas MD 08/12/2020

## 2020-08-13 ENCOUNTER — Other Ambulatory Visit: Payer: Self-pay

## 2020-08-13 ENCOUNTER — Ambulatory Visit (HOSPITAL_COMMUNITY): Payer: Medicare HMO | Attending: Cardiology

## 2020-08-13 DIAGNOSIS — R011 Cardiac murmur, unspecified: Secondary | ICD-10-CM | POA: Diagnosis not present

## 2020-08-13 LAB — ECHOCARDIOGRAM COMPLETE
AR max vel: 1.23 cm2
AV Area VTI: 1.33 cm2
AV Area mean vel: 1.23 cm2
AV Mean grad: 8 mmHg
AV Peak grad: 17.6 mmHg
Ao pk vel: 2.1 m/s
Area-P 1/2: 2.99 cm2
S' Lateral: 1 cm

## 2020-08-13 LAB — CBC
Hematocrit: 32.3 % — ABNORMAL LOW (ref 34.0–46.6)
Hemoglobin: 11.2 g/dL (ref 11.1–15.9)
MCH: 32.3 pg (ref 26.6–33.0)
MCHC: 34.7 g/dL (ref 31.5–35.7)
MCV: 93 fL (ref 79–97)
Platelets: 247 10*3/uL (ref 150–450)
RBC: 3.47 x10E6/uL — ABNORMAL LOW (ref 3.77–5.28)
RDW: 13.4 % (ref 11.7–15.4)
WBC: 9.5 10*3/uL (ref 3.4–10.8)

## 2020-08-13 LAB — VITAMIN B12: Vitamin B-12: 1641 pg/mL — ABNORMAL HIGH (ref 232–1245)

## 2020-08-13 LAB — FOLATE: Folate: >20 ng/mL (ref >3.0)

## 2020-08-13 LAB — RPR: RPR Ser Ql: NONREACTIVE

## 2020-08-13 LAB — TSH: TSH: 5.15 u[IU]/mL — ABNORMAL HIGH (ref 0.450–4.500)

## 2020-08-13 LAB — SEDIMENTATION RATE: Sed Rate: 11 mm/hr (ref 0–40)

## 2020-08-13 NOTE — Progress Notes (Signed)
Please contact daughter ( out of state ) with results: patient is slightly anemic ( trend downwards- check for blood in stool) and over supplemented with Vitamin B 12, has still a high TSH ( hypothyroidism ) but trend is good, lower level for TSH than last tested. Sed rate normal.  Keep hydrating and monitor nutritional intake.  Weight loss is concerning for wasting disease versus forgetting to eat due to cognitive decline.  This patient needs medication and nutrition intake monitoring. MRI brain is pending.

## 2020-08-18 ENCOUNTER — Telehealth: Payer: Self-pay | Admitting: Neurology

## 2020-08-18 NOTE — Telephone Encounter (Signed)
-----   Message from Melvyn Novas, MD sent at 08/13/2020  1:15 PM EDT ----- Please contact daughter ( out of state ) with results: patient is slightly anemic ( trend downwards- check for blood in stool) and over supplemented with Vitamin B 12, has still a high TSH ( hypothyroidism ) but trend is good, lower level for TSH than last tested. Sed rate normal.  Keep hydrating and monitor nutritional intake.  Weight loss is concerning for wasting disease versus forgetting to eat due to cognitive decline.  This patient needs medication and nutrition intake monitoring. MRI brain is pending.

## 2020-08-18 NOTE — Telephone Encounter (Signed)
Called the pt's daughter. There was no answer. LVM for a call back

## 2020-08-18 NOTE — Telephone Encounter (Signed)
Pt's daughter returned call and I was able to review lab results with her. She verbalized understanding and had no questions. Pt is scheduled 7/15 for MRI. Advised once we have those results, we will call to review

## 2020-08-19 ENCOUNTER — Other Ambulatory Visit: Payer: Self-pay

## 2020-08-19 ENCOUNTER — Ambulatory Visit: Payer: Self-pay | Admitting: Nurse Practitioner

## 2020-08-19 ENCOUNTER — Encounter: Payer: Self-pay | Admitting: Nurse Practitioner

## 2020-08-19 ENCOUNTER — Ambulatory Visit (INDEPENDENT_AMBULATORY_CARE_PROVIDER_SITE_OTHER): Payer: Medicare HMO | Admitting: Nurse Practitioner

## 2020-08-19 VITALS — BP 110/72 | HR 62 | Temp 98.4°F | Ht 64.0 in | Wt 109.6 lb

## 2020-08-19 DIAGNOSIS — Z4802 Encounter for removal of sutures: Secondary | ICD-10-CM | POA: Diagnosis not present

## 2020-08-19 DIAGNOSIS — W19XXXD Unspecified fall, subsequent encounter: Secondary | ICD-10-CM | POA: Diagnosis not present

## 2020-08-19 DIAGNOSIS — S0990XD Unspecified injury of head, subsequent encounter: Secondary | ICD-10-CM | POA: Diagnosis not present

## 2020-08-19 NOTE — Progress Notes (Signed)
I,[name] as a Neurosurgeon for [employer], FNP.,have documented all relevant documentation on the behalf of Milia Warth, FNP,as directed by  Arnette Felts, FNP while in the presence of Arnette Felts, FNP.  This visit occurred during the SARS-CoV-2 public health emergency.  Safety protocols were in place, including screening questions prior to the visit, additional usage of staff PPE, and extensive cleaning of exam room while observing appropriate contact time as indicated for disinfecting solutions.  Subjective:     Patient ID: Amy Whitehead , female    DOB: Aug 25, 1933 , 87 y.o.   MRN: [id number]   Chief Complaint  Patient presents with   Fall    Patient fell and had some stitches placed in her head     HPI  Patient presents today for a suture removal.  Her daughter had called her on Tuesday before and was unable to get her, and the next call she received was from the hospital letting her know she was there. She had 6 staples placed. The patient is unable to recall what happened.    Fall The accident occurred More than 1 week ago. The fall occurred while walking. She fell from an unknown height. She landed on [place]. The point of impact was the head. The symptoms are aggravated by ambulation. Pertinent negatives include no abdominal pain, fever, headaches or numbness. Treatments tried: she had 6 staples placed.    Past Medical History:  Diagnosis Date   Arthritis    Cataract    Post herpetic neuralgia 04/17/2011     Family History  Problem Relation Age of Onset   Cancer Father        brain tumor   Cancer Sister        lung   Diabetes Sister      Current Outpatient Medications:    acetaminophen (TYLENOL) 500 MG tablet, Take 500 mg by mouth every 6 (six) hours as needed., Disp: , Rfl:    amLODipine (NORVASC) 5 MG tablet, Take 1 tablet (5 mg total) by mouth daily., Disp: 90 tablet, Rfl: 1   aspirin EC 81 MG tablet, Take 81 mg by mouth daily., Disp: , Rfl:     Calcium-Vitamin D (CALTRATE 600 PLUS-VIT D PO), Take 2 tablets by mouth 2 (two) times daily., Disp: , Rfl:    gabapentin (NEURONTIN) 300 MG capsule, Take 3 capsules (900 mg total) by mouth 3 (three) times daily., Disp: 270 capsule, Rfl: 3   levothyroxine (SYNTHROID) 25 MCG tablet, Take 1 tablet (25 mcg total) by mouth daily before breakfast., Disp: 90 tablet, Rfl: 1   Multiple Vitamin (MULTIVITAMIN WITH MINERALS) TABS tablet, Take 1 tablet by mouth daily., Disp: , Rfl:    polyethylene glycol powder (MIRALAX) 17 GM/SCOOP powder, Take 17 g by mouth 2 (two) times daily as needed for moderate constipation., Disp: 255 g, Rfl: 0   QUEtiapine (SEROQUEL) 25 MG tablet, Take 1 tablet (25 mg total) by mouth at bedtime., Disp: 90 tablet, Rfl: 1   docusate sodium (COLACE) 100 MG capsule, Take 1 capsule (100 mg total) by mouth daily as needed for mild constipation. (Patient not taking: No sig reported), Disp: 60 capsule, Rfl: 2   No Known Allergies   Review of Systems  Constitutional: Negative.  Negative for fever.  Eyes: Negative.   Respiratory: Negative.  Negative for cough and wheezing.   Cardiovascular: Negative.  Negative for chest pain, palpitations and leg swelling.  Gastrointestinal:  Negative for abdominal pain.  Musculoskeletal: Negative.  Skin: Negative.   Neurological:  Negative for dizziness, numbness and headaches.  Psychiatric/Behavioral: Negative.      Today's Vitals   08/19/20 1108  BP: 110/72  Pulse: 62  Temp: 98.4 F (36.9 C)  Weight: 109 lb 9.6 oz (49.7 kg)  Height: 5\' 4"  (1.626 m)  PainSc: 0-No pain   Body mass index is 18.81 kg/m.   Objective:  Physical Exam Constitutional:      General: She is not in acute distress.    Appearance: Normal appearance.  Cardiovascular:     Rate and Rhythm: Normal rate and regular rhythm.     Pulses: Normal pulses.     Heart sounds: Normal heart sounds. No murmur heard. Pulmonary:     Effort: Pulmonary effort is normal. No  respiratory distress.     Breath sounds: Normal breath sounds. No wheezing.  Skin:    Capillary Refill: Capillary refill takes less than 2 seconds.     Comments: Sutures to posterior head removed with good healing to area.   Neurological:     General: No focal deficit present.     Mental Status: She is alert and oriented to person, place, and time.     Cranial Nerves: No cranial nerve deficit.     Motor: No weakness.  Psychiatric:        Mood and Affect: Mood normal.        Behavior: Behavior normal.        Thought Content: Thought content normal.        Judgment: Judgment normal.        Assessment And Plan:     1. Fall, subsequent encounter Comments: Dgt and patient unsure of how patient fell Encouraged patient make sure area clear of throw rugs Recommend getting life alert  2. Visit for suture removal Comments: removed 6 sutures from posterior head Cleansed surrounding area with peroxide Called daughter made aware she will need hair washing from dried blood    Patient was given opportunity to ask questions. Patient verbalized understanding of the plan and was able to repeat key elements of the plan. All questions were answered to their satisfaction.  , FNP   I, Arnette Felts, FNP, have reviewed all documentation for this visit. The documentation on 09/17/20 for the exam, diagnosis, procedures, and orders are all accurate and complete.   IF YOU HAVE BEEN REFERRED TO A SPECIALIST, IT MAY TAKE 1-2 WEEKS TO SCHEDULE/PROCESS THE REFERRAL. IF YOU HAVE NOT HEARD FROM US/SPECIALIST IN TWO WEEKS, PLEASE GIVE 09/19/20 A CALL AT [phone].   THE PATIENT IS ENCOURAGED TO PRACTICE SOCIAL DISTANCING DUE TO THE COVID-19 PANDEMIC.

## 2020-08-19 NOTE — Patient Instructions (Signed)
Wound Closure Removal, Care After This sheet gives you information about how to care for yourself after your stitches (sutures), staples, or skin adhesives have been removed. Your health care provider may also give you more specific instructions. If you have problems or questions,contact your health care provider. What can I expect after the procedure? After your sutures or staples have been removed or your skin adhesives have fallen off, it is common to have: Some discomfort and swelling in the area. Slight redness in the area. Follow these instructions at home: If you have a bandage: Wash your hands with soap and water before you change your bandage (dressing). If soap and water are not available, use hand sanitizer. Change your dressing as told by your health care provider. If your dressing becomes wet or dirty, or develops a bad smell, change it as soon as possible. If your dressing sticks to your skin, pour warm, clean water over it until it loosens and can be removed without pulling apart the wound edges. Pat the area dry with a soft, clean towel. Do not rub the wound because that may cause bleeding. Wound care  Keep the wound area dry and clean. Check your wound every day for signs of infection. Check for: Redness, swelling, or pain. Fluid or blood. Warmth. Pus or a bad smell. Wash your hands with soap and water before and after touching your wound. Apply cream or ointment only as told by your health care provider. If you are using cream or ointment, wash the area with soap and water 2 times a day to remove all the cream or ointment. Rinse off the soap and pat the area dry with a clean towel. If skin glue or adhesive strips were applied after sutures or staples were removed, leave these closures in place until they peel off on their own. If adhesive strip edges start to loosen and curl up, you may trim the loose edges. Do not remove adhesive strips completely unless your health care  provider tells you to do that. Continue to protect the wound from injury. Do not pick at your wound. Picking can cause an infection.  Bathing Do not take baths, swim, or use a hot tub until your health care provider approves. Ask your health care provider when it is okay to shower. Follow these steps for showering: If you have a dressing, remove it before getting into the shower. In the shower, allow soapy water to get on the wound. Avoid scrubbing the wound. When you get out of the shower, dry the wound by patting it with a clean towel. Reapply a dressing over the wound if needed. Scar care When your wound has completely healed, take actions to help decrease the size of your scar: Wear sunscreen over the scar or cover it with clothing when you are outside. New scars get sunburned easily, which can make scarring worse. Gently massage the scarred area. This can decrease scar thickness. General instructions Take over-the-counter and prescription medicines only as told by your health care provider. Keep all follow-up visits as told by your health care provider. This is important. Contact a health care provider if: You have redness, swelling, or pain around your wound. You have fluid or blood coming from your wound. Your wound feels warm to the touch. You have pus or a bad smell coming from your wound. Your wound opens up. You have chills. Get help right away if: You have a fever. You have redness that is spreading from your   wound. Summary Change your dressing as told by your health care provider. If your dressing becomes wet or dirty, or develops a bad smell, change it as soon as possible. Check your wound every day for signs of infection. Wash your hands with soap and water before and after touching your wound. This information is not intended to replace advice given to you by your health care provider. Make sure you discuss any questions you have with your healthcare  provider. Document Revised: 12/11/2019 Document Reviewed: 12/12/2019 Elsevier Patient Education  2022 Elsevier Inc.   Do a gentle washing of the hair to get the old blood out of hair.

## 2020-09-08 ENCOUNTER — Encounter: Payer: Self-pay | Admitting: Neurology

## 2020-09-09 ENCOUNTER — Ambulatory Visit (INDEPENDENT_AMBULATORY_CARE_PROVIDER_SITE_OTHER): Payer: Medicare HMO | Admitting: Nurse Practitioner

## 2020-09-09 ENCOUNTER — Other Ambulatory Visit: Payer: Self-pay

## 2020-09-09 ENCOUNTER — Ambulatory Visit (INDEPENDENT_AMBULATORY_CARE_PROVIDER_SITE_OTHER): Payer: Medicare HMO

## 2020-09-09 VITALS — BP 110/60 | HR 83 | Temp 98.2°F | Ht 62.6 in | Wt 106.0 lb

## 2020-09-09 VITALS — BP 110/60 | HR 83 | Temp 97.0°F | Ht 62.6 in | Wt 106.0 lb

## 2020-09-09 DIAGNOSIS — R8290 Unspecified abnormal findings in urine: Secondary | ICD-10-CM | POA: Diagnosis not present

## 2020-09-09 DIAGNOSIS — E039 Hypothyroidism, unspecified: Secondary | ICD-10-CM

## 2020-09-09 DIAGNOSIS — Z0000 Encounter for general adult medical examination without abnormal findings: Secondary | ICD-10-CM | POA: Diagnosis not present

## 2020-09-09 NOTE — Patient Instructions (Signed)

## 2020-09-09 NOTE — Patient Instructions (Signed)
Amy Whitehead , Thank you for taking time to come for your Medicare Wellness Visit. I appreciate your ongoing commitment to your health goals. Please review the following plan we discussed and let me know if I can assist you in the future.   Screening recommendations/referrals: Colonoscopy: not required Mammogram: not required Bone Density: completed 09/03/2015 Recommended yearly ophthalmology/optometry visit for glaucoma screening and checkup Recommended yearly dental visit for hygiene and checkup  Vaccinations: Influenza vaccine: completed 11/04/2019, due 09/27/2020 Pneumococcal vaccine: completed 05/14/2017 Tdap vaccine: completed 10/14/2015, due 10/13/2025 Shingles vaccine: discussed   Covid-19: 12/08/2019, 05/07/2019, 04/12/2019  Advanced directives: Please bring a copy of your POA (Power of Attorney) and/or Living Will to your next appointment.   Conditions/risks identified: none  Next appointment: Follow up in one year for your annual wellness visit    Preventive Care 65 Years and Older, Female Preventive care refers to lifestyle choices and visits with your health care provider that can promote health and wellness. What does preventive care include? A yearly physical exam. This is also called an annual well check. Dental exams once or twice a year. Routine eye exams. Ask your health care provider how often you should have your eyes checked. Personal lifestyle choices, including: Daily care of your teeth and gums. Regular physical activity. Eating a healthy diet. Avoiding tobacco and drug use. Limiting alcohol use. Practicing safe sex. Taking low-dose aspirin every day. Taking vitamin and mineral supplements as recommended by your health care provider. What happens during an annual well check? The services and screenings done by your health care provider during your annual well check will depend on your age, overall health, lifestyle risk factors, and family history of  disease. Counseling  Your health care provider may ask you questions about your: Alcohol use. Tobacco use. Drug use. Emotional well-being. Home and relationship well-being. Sexual activity. Eating habits. History of falls. Memory and ability to understand (cognition). Work and work Astronomer. Reproductive health. Screening  You may have the following tests or measurements: Height, weight, and BMI. Blood pressure. Lipid and cholesterol levels. These may be checked every 5 years, or more frequently if you are over 70 years old. Skin check. Lung cancer screening. You may have this screening every year starting at age 39 if you have a 30-pack-year history of smoking and currently smoke or have quit within the past 15 years. Fecal occult blood test (FOBT) of the stool. You may have this test every year starting at age 69. Flexible sigmoidoscopy or colonoscopy. You may have a sigmoidoscopy every 5 years or a colonoscopy every 10 years starting at age 66. Hepatitis C blood test. Hepatitis B blood test. Sexually transmitted disease (STD) testing. Diabetes screening. This is done by checking your blood sugar (glucose) after you have not eaten for a while (fasting). You may have this done every 1-3 years. Bone density scan. This is done to screen for osteoporosis. You may have this done starting at age 29. Mammogram. This may be done every 1-2 years. Talk to your health care provider about how often you should have regular mammograms. Talk with your health care provider about your test results, treatment options, and if necessary, the need for more tests. Vaccines  Your health care provider may recommend certain vaccines, such as: Influenza vaccine. This is recommended every year. Tetanus, diphtheria, and acellular pertussis (Tdap, Td) vaccine. You may need a Td booster every 10 years. Zoster vaccine. You may need this after age 100. Pneumococcal 13-valent conjugate (PCV13) vaccine.  One  dose is recommended after age 6. Pneumococcal polysaccharide (PPSV23) vaccine. One dose is recommended after age 77. Talk to your health care provider about which screenings and vaccines you need and how often you need them. This information is not intended to replace advice given to you by your health care provider. Make sure you discuss any questions you have with your health care provider. Document Released: 03/12/2015 Document Revised: 11/03/2015 Document Reviewed: 12/15/2014 Elsevier Interactive Patient Education  2017 [place] Prevention in the Home Falls can cause injuries. They can happen to people of all ages. There are many things you can do to make your home safe and to help prevent falls. What can I do on the outside of my home? Regularly fix the edges of walkways and driveways and fix any cracks. Remove anything that might make you trip as you walk through a door, such as a raised step or threshold. Trim any bushes or trees on the path to your home. Use bright outdoor lighting. Clear any walking paths of anything that might make someone trip, such as rocks or tools. Regularly check to see if handrails are loose or broken. Make sure that both sides of any steps have handrails. Any raised decks and porches should have guardrails on the edges. Have any leaves, snow, or ice cleared regularly. Use sand or salt on walking paths during winter. Clean up any spills in your garage right away. This includes oil or grease spills. What can I do in the bathroom? Use night lights. Install grab bars by the toilet and in the tub and shower. Do not use towel bars as grab bars. Use non-skid mats or decals in the tub or shower. If you need to sit down in the shower, use a plastic, non-slip stool. Keep the floor dry. Clean up any water that spills on the floor as soon as it happens. Remove soap buildup in the tub or shower regularly. Attach bath mats securely with double-sided  non-slip rug tape. Do not have throw rugs and other things on the floor that can make you trip. What can I do in the bedroom? Use night lights. Make sure that you have a light by your bed that is easy to reach. Do not use any sheets or blankets that are too big for your bed. They should not hang down onto the floor. Have a firm chair that has side arms. You can use this for support while you get dressed. Do not have throw rugs and other things on the floor that can make you trip. What can I do in the kitchen? Clean up any spills right away. Avoid walking on wet floors. Keep items that you use a lot in easy-to-reach places. If you need to reach something above you, use a strong step stool that has a grab bar. Keep electrical cords out of the way. Do not use floor polish or wax that makes floors slippery. If you must use wax, use non-skid floor wax. Do not have throw rugs and other things on the floor that can make you trip. What can I do with my stairs? Do not leave any items on the stairs. Make sure that there are handrails on both sides of the stairs and use them. Fix handrails that are broken or loose. Make sure that handrails are as long as the stairways. Check any carpeting to make sure that it is firmly attached to the stairs. Fix any carpet that is loose or  worn. Avoid having throw rugs at the top or bottom of the stairs. If you do have throw rugs, attach them to the floor with carpet tape. Make sure that you have a light switch at the top of the stairs and the bottom of the stairs. If you do not have them, ask someone to add them for you. What else can I do to help prevent falls? Wear shoes that: Do not have high heels. Have rubber bottoms. Are comfortable and fit you well. Are closed at the toe. Do not wear sandals. If you use a stepladder: Make sure that it is fully opened. Do not climb a closed stepladder. Make sure that both sides of the stepladder are locked into place. Ask  someone to hold it for you, if possible. Clearly mark and make sure that you can see: Any grab bars or handrails. First and last steps. Where the edge of each step is. Use tools that help you move around (mobility aids) if they are needed. These include: Canes. Walkers. Scooters. Crutches. Turn on the lights when you go into a dark area. Replace any light bulbs as soon as they burn out. Set up your furniture so you have a clear path. Avoid moving your furniture around. If any of your floors are uneven, fix them. If there are any pets around you, be aware of where they are. Review your medicines with your doctor. Some medicines can make you feel dizzy. This can increase your chance of falling. Ask your doctor what other things that you can do to help prevent falls. This information is not intended to replace advice given to you by your health care provider. Make sure you discuss any questions you have with your health care provider. Document Released: 12/10/2008 Document Revised: 07/22/2015 Document Reviewed: 03/20/2014 Elsevier Interactive Patient Education  2017 [employer].

## 2020-09-09 NOTE — Progress Notes (Signed)
I,Tianna Badgett,acting as a Neurosurgeon for [employer], FNP.,have documented all relevant documentation on the behalf of Arnette Felts, FNP,as directed by  Arnette Felts, FNP while in the presence of Arnette Felts, FNP.   This visit occurred during the SARS-CoV-2 public health emergency.  Safety protocols were in place, including screening questions prior to the visit, additional usage of staff PPE, and extensive cleaning of exam room while observing appropriate contact time as indicated for disinfecting solutions.  Subjective:     Patient ID: Amy Whitehead , female    DOB: 07-11-1933 , 87 y.o.   MRN: [id number]   Chief Complaint  Patient presents with   Hypothyroidism    HPI  Patient is here for follow up for thyroid. Also had her AWV    Past Medical History:  Diagnosis Date   Arthritis    Cataract    Post herpetic neuralgia 04/17/2011     Family History  Problem Relation Age of Onset   Cancer Father        brain tumor   Cancer Sister        lung   Diabetes Sister      Current Outpatient Medications:    acetaminophen (TYLENOL) 500 MG tablet, Take 500 mg by mouth every 6 (six) hours as needed., Disp: , Rfl:    amLODipine (NORVASC) 5 MG tablet, Take 1 tablet (5 mg total) by mouth daily., Disp: 90 tablet, Rfl: 1   aspirin EC 81 MG tablet, Take 81 mg by mouth daily., Disp: , Rfl:    Calcium-Vitamin D (CALTRATE 600 PLUS-VIT D PO), Take 2 tablets by mouth 2 (two) times daily., Disp: , Rfl:    docusate sodium (COLACE) 100 MG capsule, Take 1 capsule (100 mg total) by mouth daily as needed for mild constipation. (Patient not taking: No sig reported), Disp: 60 capsule, Rfl: 2   gabapentin (NEURONTIN) 300 MG capsule, Take 3 capsules (900 mg total) by mouth 3 (three) times daily., Disp: 270 capsule, Rfl: 3   levothyroxine (SYNTHROID) 25 MCG tablet, Take 1 tablet (25 mcg total) by mouth daily before breakfast., Disp: 90 tablet, Rfl: 1   Multiple Vitamin (MULTIVITAMIN WITH MINERALS) TABS  tablet, Take 1 tablet by mouth daily., Disp: , Rfl:    polyethylene glycol powder (MIRALAX) 17 GM/SCOOP powder, Take 17 g by mouth 2 (two) times daily as needed for moderate constipation., Disp: 255 g, Rfl: 0   QUEtiapine (SEROQUEL) 25 MG tablet, Take 1 tablet (25 mg total) by mouth at bedtime., Disp: 90 tablet, Rfl: 1   No Known Allergies   Review of Systems  Constitutional: Negative.   Respiratory: Negative.    Cardiovascular: Negative.  Negative for chest pain, palpitations and leg swelling.  Gastrointestinal: Negative.   Musculoskeletal: Negative.   Neurological: Negative.   Psychiatric/Behavioral: Negative.      Today's Vitals   09/09/20 1608  BP: 110/60  Pulse: 83  Temp: (!) 97 F (36.1 C)  TempSrc: Oral  Weight: 106 lb (48.1 kg)  Height: 5' 2.6" (1.59 m)   Body mass index is 19.02 kg/m.   Objective:  Physical Exam Constitutional:      General: She is not in acute distress.    Appearance: Normal appearance.  Cardiovascular:     Rate and Rhythm: Normal rate and regular rhythm.     Pulses: Normal pulses.     Heart sounds: Normal heart sounds. No murmur heard. Pulmonary:     Effort: Pulmonary effort is normal. No respiratory  distress.     Breath sounds: Normal breath sounds. No wheezing.  Neurological:     General: No focal deficit present.     Mental Status: She is alert and oriented to person, place, and time.     Cranial Nerves: No cranial nerve deficit.     Motor: No weakness.  Psychiatric:        Mood and Affect: Mood normal.        Behavior: Behavior normal. Behavior is not agitated. Behavior is cooperative.        Thought Content: Thought content normal.        Cognition and Memory: She exhibits impaired recent memory.        Judgment: Judgment is inappropriate (she can make basic decisions but does not realize her ability to live in an environment that does not have some supervision).        Assessment And Plan:     1. Acquired hypothyroidism -  CBC - T4 - T3, free - TSH  2. Abnormal urine - Urine Culture     Patient was given opportunity to ask questions. Patient verbalized understanding of the plan and was able to repeat key elements of the plan. All questions were answered to their satisfaction.  Arnette Felts, FNP   I, Arnette Felts, FNP, have reviewed all documentation for this visit. The documentation on 10/03/20 for the exam, diagnosis, procedures, and orders are all accurate and complete.   IF YOU HAVE BEEN REFERRED TO A SPECIALIST, IT MAY TAKE 1-2 WEEKS TO SCHEDULE/PROCESS THE REFERRAL. IF YOU HAVE NOT HEARD FROM US/SPECIALIST IN TWO WEEKS, PLEASE GIVE [place] A CALL AT [phone].   THE PATIENT IS ENCOURAGED TO PRACTICE SOCIAL DISTANCING DUE TO THE COVID-19 PANDEMIC.

## 2020-09-09 NOTE — Progress Notes (Signed)
This visit occurred during the SARS-CoV-2 public health emergency.  Safety protocols were in place, including screening questions prior to the visit, additional usage of staff PPE, and extensive cleaning of exam room while observing appropriate contact time as indicated for disinfecting solutions.  Subjective:   Amy PatteeMary P Whitehead is a 87 y.o. female who presents for Medicare Annual (Subsequent) preventive examination.  Review of Systems     Cardiac Risk Factors include: advanced age (>5955men, 81>65 women)     Objective:    Today's Vitals   09/09/20 1528  BP: 110/60  Pulse: 83  Temp: 98.2 F (36.8 C)  TempSrc: Oral  SpO2: 97%  Weight: 106 lb (48.1 kg)  Height: 5' 2.6" (1.59 m)   Body mass index is 19.02 kg/m.  Advanced Directives 09/09/2020 08/10/2020 01/15/2020 01/12/2020 01/11/2020 04/30/2019 12/20/2017  Does Patient Have a Medical Advance Directive? Yes No Unable to assess, patient is non-responsive or altered mental status Yes Unable to assess, patient is non-responsive or altered mental status Yes Yes  Type of Public librarianAdvance Directive Healthcare Power of [place]orney;Living will - Living will Living will - Healthcare Power of [place]orney;Living will Healthcare Power of [place];Living will  Does patient want to make changes to medical advance directive? - - Yes (Inpatient - patient defers changing a medical advance directive at this time - Information given) Yes (Inpatient - patient defers changing a medical advance directive and declines information at this time) - - -  Copy of Healthcare Power of Attorney in Chart? No - copy requested - - - - No - copy requested No - copy requested  Would patient like information on creating a medical advance directive? - No - Patient declined - - - - -    Current Medications (verified) Outpatient Encounter Medications as of 09/09/2020  Medication Sig   acetaminophen (TYLENOL) 500 MG tablet Take 500 mg by mouth every 6 (six) hours as needed.   amLODipine (NORVASC)  5 MG tablet Take 1 tablet (5 mg total) by mouth daily.   aspirin EC 81 MG tablet Take 81 mg by mouth daily.   Calcium-Vitamin D (CALTRATE 600 PLUS-VIT D PO) Take 2 tablets by mouth 2 (two) times daily.   docusate sodium (COLACE) 100 MG capsule Take 1 capsule (100 mg total) by mouth daily as needed for mild constipation. (Patient not taking: No sig reported)   gabapentin (NEURONTIN) 300 MG capsule Take 3 capsules (900 mg total) by mouth 3 (three) times daily.   levothyroxine (SYNTHROID) 25 MCG tablet Take 1 tablet (25 mcg total) by mouth daily before breakfast.   Multiple Vitamin (MULTIVITAMIN WITH MINERALS) TABS tablet Take 1 tablet by mouth daily.   polyethylene glycol powder (MIRALAX) 17 GM/SCOOP powder Take 17 g by mouth 2 (two) times daily as needed for moderate constipation.   QUEtiapine (SEROQUEL) 25 MG tablet Take 1 tablet (25 mg total) by mouth at bedtime.   No facility-administered encounter medications on file as of 09/09/2020.    Allergies (verified) Patient has no known allergies.   History: Past Medical History:  Diagnosis Date   Arthritis    Cataract    Post herpetic neuralgia 04/17/2011   Past Surgical History:  Procedure Laterality Date   ABDOMINAL HYSTERECTOMY  1980   For tumor on ovary - likely benign as she had no further treatment.  Unsure if uterus was removed, but menses stopped after surgery   Family History  Problem Relation Age of Onset   Cancer Father  brain tumor   Cancer Sister        lung   Diabetes Sister    Social History   Socioeconomic History   Marital status: Widowed    Spouse name: Not on file   Number of children: 3   Years of education: 70   Highest education level: 12th grade  Occupational History   Occupation: order entry/ key punch   Occupation: retired  Tobacco Use   Smoking status: Former    Types: Cigarettes    Quit date: 04/16/1962    Years since quitting: 58.4   Smokeless tobacco: Never   Tobacco comments:    No  plans to start again  Vaping Use   Vaping Use: Never used  Substance and Sexual Activity   Alcohol use: No   Drug use: No   Sexual activity: Not Currently  Other Topics Concern   Not on file  Social History Narrative   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 High school graduate.  Lives alone. Has one dog, a Boston terrier named [place]. Walks daily for about an hour. Has 2 sons (one in [place], one in [place]), strained relationship with them.  Has a daughter and 2 grandchildren, age 43 and 42, who live in [place] area.  Strained relationship with son-in-law, who smokes and drinks.  Single, but helps a friend who currently has dementia.  Lives alone.  Drives.  Exercises by walking, especially when weather is nice.      Lives in [place]. 2nd level. No problems with the stairs. No throw rugs. Smoke alarms. Wears seat belts in the car.       Diet: eats fruit, vegetables, and some meat.      09/09/2020, Patient now lives at [facility].   Social Determinants of Health   Financial Resource Strain: Low Risk    Difficulty of Paying Living Expenses: Not hard at all  Food Insecurity: No Food Insecurity   Worried About Programme researcher, broadcasting/film/video in the Last Year: Never true   Ran Out of Food in the Last Year: Never true  Transportation Needs: No Transportation Needs   Lack of Transportation (Medical): No   Lack of Transportation (Non-Medical): No  Physical Activity: Inactive   Days of Exercise per Week: 0 days   Minutes of Exercise per Session: 0 min  Stress: No Stress Concern Present   Feeling of Stress : Not at all  Social Connections: Not on file    Tobacco Counseling Counseling given: Not Answered Tobacco comments: No plans to start again   Clinical Intake:  Pre-visit preparation completed: Yes  Pain : No/denies pain     Nutritional Status: BMI of 19-24  Normal Nutritional Risks: None Diabetes: No  How often do you need to have someone help  you when you read instructions, pamphlets, or other written materials from your doctor or pharmacy?: 3 - Sometimes  Diabetic? no  Interpreter Needed?: No  Information entered by :: NAllen LPN   Activities of Daily Living In your present state of health, do you have any difficulty performing the following activities: 09/09/2020 01/15/2020  Hearing? N N  Vision? N N  Difficulty concentrating or making decisions? Malvin Johns  Walking or climbing stairs? Y Y  Dressing or bathing? Y N  Doing errands, shopping? Y -  Quarry manager and eating ? Y -  Using the Toilet? N -  In the past six months, have you accidently leaked urine?  Y -  Do you have problems with loss of bowel control? N -  Managing your Medications? Y -  Managing your Finances? Y -  Housekeeping or managing your Housekeeping? Y -  Some recent data might be hidden    Patient Care Team: Arnette Felts, FNP as PCP - General (General Practice) Glenford Peers, OD as Referring Physician (Optometry) [place], Annye Rusk, DPM as Consulting Physician (Podiatry) Jerilee Field, MD as Consulting Physician (Urology)  Indicate any recent Medical Services you may have received from other than [facility] providers in the past year (date may be approximate).     Assessment:   This is a routine wellness examination for Amy Whitehead.  Hearing/Vision screen Vision Screening - Comments:: No regular eye exams, Mr. Hubbard Robinson  Dietary issues and exercise activities discussed: Current Exercise Habits: The patient does not participate in regular exercise at present   Goals Addressed             This Visit's Progress    Patient Stated       09/09/2020, no goals       Depression Screen PHQ 2/9 Scores 09/09/2020 06/03/2020 04/30/2019 01/29/2019 12/20/2017 05/14/2017 10/14/2015  PHQ - 2 Score 0 0 0 0 0 0 0  PHQ- 9 Score - - 0 - - - -    Fall Risk Fall Risk  09/09/2020 06/03/2020 04/30/2019 01/29/2019 12/20/2017  Falls in the past year? 1 0 0 0 No  Comment not  sure how fell - - - -  Number falls in past yr: 1 - - - -  Injury with Fall? 1 - - - -  Risk for fall due to : Impaired balance/gait;Impaired mobility;Medication side effect - Impaired balance/gait - -  Risk for fall due to: Comment - - - - -  Follow up Falls evaluation completed;Education provided;Falls prevention discussed - Falls evaluation completed;Education provided;Falls prevention discussed - -    FALL RISK PREVENTION PERTAINING TO THE HOME:  Any stairs in or around the home? Yes  If so, are there any without handrails? No  Home free of loose throw rugs in walkways, pet beds, electrical cords, etc? Yes  Adequate lighting in your home to reduce risk of falls? Yes   ASSISTIVE DEVICES UTILIZED TO PREVENT FALLS:  Life alert? No  Use of a cane, walker or w/c? Yes  Grab bars in the bathroom? Yes  Shower chair or bench in shower? No  Elevated toilet seat or a handicapped toilet? Yes   TIMED UP AND GO:  Was the test performed? No .    Gait slow and steady with assistive device  Cognitive Function: MMSE - Mini Mental State Exam 08/12/2020 12/20/2017  Orientation to time 2 5  Orientation to Place 4 5  Registration 3 3  Attention/ Calculation 5 5  Recall 0 3  Language- name 2 objects 2 2  Language- repeat 1 1  Language- follow 3 step command 2 3  Language- read & follow direction 1 1  Write a sentence 1 1  Copy design 0 1  Total score 21 30     6CIT Screen 04/01/2020 04/30/2019 12/20/2017  What Year? 4 points 0 points 0 points  What month? 3 points 0 points 0 points  What time? 0 points 0 points 0 points  Count back from 20 0 points 0 points 0 points  Months in reverse 4 points 4 points 0 points  Repeat phrase 2 points 0 points 0 points  Total Score 13 4 0  Immunizations Immunization History  Administered Date(s) Administered   Fluad Quad(high Dose 65+) 12/02/2018, 11/04/2019   Influenza,inj,Quad PF,6+ Mos 05/15/2013, 11/28/2013, 03/12/2015, 01/03/2016,  12/20/2017   Influenza-Unspecified 11/24/2016   PFIZER(Purple Top)SARS-COV-2 Vaccination 04/12/2019, 05/07/2019   Pneumococcal Conjugate-13 03/12/2015   Pneumococcal Polysaccharide-23 05/14/2017   Tdap 10/14/2015    TDAP status: Up to date  Flu Vaccine status: Up to date  Pneumococcal vaccine status: Up to date  Covid-19 vaccine status: Completed vaccines  Qualifies for Shingles Vaccine? Yes   Zostavax completed No   Shingrix Completed?: No.    Education has been provided regarding the importance of this vaccine. Patient has been advised to call insurance company to determine out of pocket expense if they have not yet received this vaccine. Advised may also receive vaccine at local pharmacy or Health Dept. Verbalized acceptance and understanding.  Screening Tests Health Maintenance  Topic Date Due   Zoster Vaccines- Shingrix (1 of 2) Never done   COVID-19 Vaccine (3 - Booster for Pfizer series) 10/07/2019   INFLUENZA VACCINE  09/27/2020   TETANUS/TDAP  10/13/2025   DEXA SCAN  Completed   PNA vac Low Risk Adult  Completed   HPV VACCINES  Aged Out    Health Maintenance  Health Maintenance Due  Topic Date Due   Zoster Vaccines- Shingrix (1 of 2) Never done   COVID-19 Vaccine (3 - Booster for Pfizer series) 10/07/2019    Colorectal cancer screening: No longer required.   Mammogram status: Completed 11/14/2019. Repeat every year  Bone Density status: Completed 09/03/2015.   Lung Cancer Screening: (Low Dose CT Chest recommended if Age 67-80 years, 30 pack-year currently smoking OR have quit w/in 15years.) does not qualify.   Lung Cancer Screening Referral: no  Additional Screening:  Hepatitis C Screening: does not qualify;   Vision Screening: Recommended annual ophthalmology exams for early detection of glaucoma and other disorders of the eye. Is the patient up to date with their annual eye exam?  No  Who is the provider or what is the name of the office in which the  patient attends annual eye exams? Dr. Hubbard Robinson If pt is not established with a provider, would they like to be referred to a provider to establish care? No .   Dental Screening: Recommended annual dental exams for proper oral hygiene  Community Resource Referral / Chronic Care Management: CRR required this visit?  No   CCM required this visit?  No      Plan:     I have personally reviewed and noted the following in the patient's chart:   Medical and social history Use of alcohol, tobacco or illicit drugs  Current medications and supplements including opioid prescriptions.  Functional ability and status Nutritional status Physical activity Advanced directives List of other physicians Hospitalizations, surgeries, and ER visits in previous 12 months Vitals Screenings to include cognitive, depression, and falls Referrals and appointments  In addition, I have reviewed and discussed with patient certain preventive protocols, quality metrics, and best practice recommendations. A written personalized care plan for preventive services as well as general preventive health recommendations were provided to patient.     Barb Merino, LPN   6/83/7290   Nurse Notes: 6 CIT not administered. Patient has a diagnosis of dementia. She is followed by neurology.

## 2020-09-10 ENCOUNTER — Other Ambulatory Visit (HOSPITAL_COMMUNITY): Admit: 2020-09-10 | Payer: Medicare HMO | Source: Ambulatory Visit | Admitting: *Deleted

## 2020-09-10 ENCOUNTER — Ambulatory Visit
Admission: RE | Admit: 2020-09-10 | Discharge: 2020-09-10 | Disposition: A | Discharge status: Home / Self Care | Payer: Medicare HMO | Source: Ambulatory Visit | Attending: Neurology | Admitting: Neurology

## 2020-09-10 ENCOUNTER — Other Ambulatory Visit (HOSPITAL_COMMUNITY)
Admission: RE | Admit: 2020-09-10 | Discharge: 2020-09-10 | Disposition: A | Discharge status: Home / Self Care | Payer: Medicare HMO | Attending: Nurse Practitioner | Admitting: Nurse Practitioner

## 2020-09-10 DIAGNOSIS — R296 Repeated falls: Secondary | ICD-10-CM

## 2020-09-10 DIAGNOSIS — F0391 Unspecified dementia with behavioral disturbance: Secondary | ICD-10-CM

## 2020-09-10 DIAGNOSIS — R410 Disorientation, unspecified: Secondary | ICD-10-CM | POA: Diagnosis not present

## 2020-09-10 DIAGNOSIS — G309 Alzheimer's disease, unspecified: Secondary | ICD-10-CM

## 2020-09-10 DIAGNOSIS — R8290 Unspecified abnormal findings in urine: Secondary | ICD-10-CM | POA: Insufficient documentation

## 2020-09-10 DIAGNOSIS — R413 Other amnesia: Secondary | ICD-10-CM

## 2020-09-10 LAB — T4: T4, Total: 6.5 ug/dL (ref 4.5–12.0)

## 2020-09-10 LAB — T3, FREE: T3, Free: 2.3 pg/mL (ref 2.0–4.4)

## 2020-09-10 LAB — CBC
Hematocrit: 37.2 % (ref 34.0–46.6)
Hemoglobin: 12.1 g/dL (ref 11.1–15.9)
MCH: 30.2 pg (ref 26.6–33.0)
MCHC: 32.5 g/dL (ref 31.5–35.7)
MCV: 93 fL (ref 79–97)
Platelets: 291 10*3/uL (ref 150–450)
RBC: 4.01 x10E6/uL (ref 3.77–5.28)
RDW: 13.2 % (ref 11.7–15.4)
WBC: 6.6 10*3/uL (ref 3.4–10.8)

## 2020-09-10 LAB — TSH: TSH: 11.5 u[IU]/mL — ABNORMAL HIGH (ref 0.450–4.500)

## 2020-09-12 LAB — URINE CULTURE: Culture: >=100000 — AB

## 2020-09-13 NOTE — Progress Notes (Signed)
IMPRESSION: This MRI of the brain without contrast shows the following: 1.   Moderate generalized cortical atrophy most pronounced in the medial temporal lobes and parietal lobes.  Though not always specific, this pattern could be seen with Alzheimer's disease. 2.   Scattered T2/FLAIR hyperintense foci in the hemispheres and pons consistent with mild chronic microvascular ischemic change. 3.   Small chronic hemorrhage in the right parietal lobe( not recent - not acute )  4.   There were no acute findings.     INTERPRETING PHYSICIAN: Richard A. Epimenio Foot, MD, PhD, FAAN Certified in  Neuroimaging by [employer]          Exam Ended: 09/10/20 10:55 Last Resulted: 09/12/20 20:14

## 2020-09-15 ENCOUNTER — Telehealth: Payer: Self-pay | Admitting: *Deleted

## 2020-09-15 NOTE — Telephone Encounter (Signed)
-----   Message from Melvyn Novas, MD sent at 09/15/2020 12:09 PM EDT ----- I already resulted those once- The brain has chronic, non-acute changes - some are age related, the small traces of a bleed in the right parietal lobe are residuals of a more remote brain bleed- not recent.  There were signs of brain atrophy - dominantly in medial temporal and parietal lobes, these can be seen with memory loss disorders and among those most likely related to Alzheimer's type Dementia.

## 2020-09-15 NOTE — Telephone Encounter (Signed)
LVM for daughter to call about results.  

## 2020-09-15 NOTE — Telephone Encounter (Signed)
Called the daughter back and was able to review the MRI findings with her in detail. She verbalized understanding of the results and had no other questions at this time.  She asked a copy of the MRI report be mailed to her, confirmed the address on file is correct.

## 2020-09-15 NOTE — Telephone Encounter (Signed)
Pt's daughter returned call. Please call.

## 2020-09-15 NOTE — Progress Notes (Signed)
I already resulted those once- The brain has chronic, non-acute changes - some are age related, the small traces of a bleed in the right parietal lobe are residuals of a more remote brain bleed- not recent.  There were signs of brain atrophy - dominantly in medial temporal and parietal lobes, these can be seen with memory loss disorders and among those most likely related to Alzheimer's type Dementia.

## 2020-10-03 ENCOUNTER — Encounter: Payer: Self-pay | Admitting: Nurse Practitioner

## 2020-10-04 DIAGNOSIS — Z8616 Personal history of COVID-19: Secondary | ICD-10-CM | POA: Diagnosis not present

## 2020-10-07 ENCOUNTER — Other Ambulatory Visit: Payer: Self-pay

## 2020-10-07 ENCOUNTER — Ambulatory Visit (INDEPENDENT_AMBULATORY_CARE_PROVIDER_SITE_OTHER): Payer: Medicare HMO | Admitting: Nurse Practitioner

## 2020-10-07 ENCOUNTER — Encounter: Payer: Self-pay | Admitting: Nurse Practitioner

## 2020-10-07 VITALS — BP 112/78 | HR 64 | Temp 97.9°F | Ht 63.6 in | Wt 109.8 lb

## 2020-10-07 DIAGNOSIS — N3090 Cystitis, unspecified without hematuria: Secondary | ICD-10-CM | POA: Diagnosis not present

## 2020-10-07 DIAGNOSIS — R8290 Unspecified abnormal findings in urine: Secondary | ICD-10-CM

## 2020-10-07 DIAGNOSIS — R82998 Other abnormal findings in urine: Secondary | ICD-10-CM

## 2020-10-07 LAB — POCT URINALYSIS DIPSTICK
Bilirubin, UA: NEGATIVE
Glucose, UA: NEGATIVE
Ketones, UA: NEGATIVE
Nitrite, UA: POSITIVE
Protein, UA: POSITIVE — AB
Spec Grav, UA: 1.02 (ref 1.010–1.025)
Urobilinogen, UA: 0.2 E.U./dL
pH, UA: 6 (ref 5.0–8.0)

## 2020-10-07 MED ORDER — CEFTRIAXONE SODIUM 1 G IJ SOLR: 1.0000 g | Freq: Once | INTRAMUSCULAR | Status: AC

## 2020-10-07 NOTE — Progress Notes (Signed)
I,Amy Whitehead as a Neurosurgeon for [employer], NP.,have documented all relevant documentation on the behalf of [employer], NP,as directed by  Charlesetta Ivory, NP while in the presence of Charlesetta Ivory, NP.  This visit occurred during the SARS-CoV-2 public health emergency.  Safety protocols were in place, including screening questions prior to the visit, additional usage of staff PPE, and extensive cleaning of exam room while observing appropriate contact time as indicated for disinfecting solutions.  Subjective:     Patient ID: Amy Whitehead , female    DOB: 27-Oct-1933 , 87 y.o.   MRN: [id number]   Chief Complaint  Patient presents with   daughter is concerned she may have a UTI   HPI  Patient's daughter feels she may still have a UTI. The nursing home called her and told her that they found the patient in the hallway in nothing but her underwear. The patient is a poor historian and unable to tell me her symptoms. She stays at [facility]. Seen on 7/14 and had UTI and had medication. Called her daughter and daughter said the nursing facility called her and told her that her mother was in her underwear in the hallway so she wanted to make sure that she doesn't have UTI.     Past Medical History:  Diagnosis Date   Arthritis    Cataract    Post herpetic neuralgia 04/17/2011     Family History  Problem Relation Age of Onset   Cancer Father        brain tumor   Cancer Sister        lung   Diabetes Sister      Current Outpatient Medications:    acetaminophen (TYLENOL) 500 MG tablet, Take 500 mg by mouth every 6 (six) hours as needed., Disp: , Rfl:    amLODipine (NORVASC) 5 MG tablet, Take 1 tablet (5 mg total) by mouth daily., Disp: 90 tablet, Rfl: 1   aspirin EC 81 MG tablet, Take 81 mg by mouth daily., Disp: , Rfl:    Calcium-Vitamin D (CALTRATE 600 PLUS-VIT D PO), Take 2 tablets by mouth 2 (two) times daily., Disp: , Rfl:    gabapentin (NEURONTIN) 300  MG capsule, Take 3 capsules (900 mg total) by mouth 3 (three) times daily., Disp: 270 capsule, Rfl: 3   levothyroxine (SYNTHROID) 25 MCG tablet, Take 1 tablet (25 mcg total) by mouth daily before breakfast., Disp: 90 tablet, Rfl: 1   Multiple Vitamin (MULTIVITAMIN WITH MINERALS) TABS tablet, Take 1 tablet by mouth daily., Disp: , Rfl:    polyethylene glycol powder (MIRALAX) 17 GM/SCOOP powder, Take 17 g by mouth 2 (two) times daily as needed for moderate constipation., Disp: 255 g, Rfl: 0   QUEtiapine (SEROQUEL) 25 MG tablet, Take 1 tablet (25 mg total) by mouth at bedtime., Disp: 90 tablet, Rfl: 1   docusate sodium (COLACE) 100 MG capsule, Take 1 capsule (100 mg total) by mouth daily as needed for mild constipation. (Patient not taking: No sig reported), Disp: 60 capsule, Rfl: 2  Current Facility-Administered Medications:    cefTRIAXone (ROCEPHIN) injection 1 g, 1 g, Intramuscular, Once, Anamae Rochelle, NP   No Known Allergies   Review of Systems  Constitutional: Negative.  Negative for chills and fever.  Respiratory: Negative.  Negative for shortness of breath and wheezing.   Cardiovascular: Negative.  Negative for chest pain and palpitations.  Gastrointestinal: Negative.   Genitourinary:  Negative for dysuria, flank pain, hematuria and urgency.  Neurological:  Negative.  Negative for headaches.  Psychiatric/Behavioral: Negative.      Today's Vitals   10/07/20 0924  BP: 112/78  Pulse: 64  Temp: 97.9 F (36.6 C)  Weight: 109 lb 12.8 oz (49.8 kg)  Height: 5' 3.6" (1.615 m)  PainSc: 0-No pain   Body mass index is 19.08 kg/m.   Objective:  Physical Exam Constitutional:      Appearance: Normal appearance. She is normal weight.  HENT:     Head: Normocephalic and atraumatic.  Cardiovascular:     Rate and Rhythm: Normal rate and regular rhythm.     Pulses: Normal pulses.     Heart sounds: Normal heart sounds. No murmur heard. Pulmonary:     Effort: Pulmonary effort is  normal. No respiratory distress.     Breath sounds: Normal breath sounds.  Skin:    Capillary Refill: Capillary refill takes less than 2 seconds.  Neurological:     Mental Status: She is alert. She is disoriented.     Comments: Poor historian. Hx of dementia  Psychiatric:     Comments: Impaired memory due to history of dementia. She is unaware of her environment        Assessment And Plan:     1. Abnormal urine - POCT Urinalysis Dipstick (81002)  2. Leukocytes in urine - Culture, Urine  3. Cystitis Urine shows large leukocytes and nitrites. Will send for culture and treat her due to her age and being more than usual disoriented as per daughter.  - cefTRIAXone (ROCEPHIN) injection 1 g   The patients daughter was called and advised about the visit and treatment.   Follow up: if symptoms persist or do not get better.   The patient was encouraged to call or send a message through MyChart for any questions or concerns.   Side effects and appropriate use of all the medication(s) were discussed with the patient today. Patient advised to use the medication(s) as directed by their healthcare provider. The patient was encouraged to read, review, and understand all associated package inserts and contact our office with any questions or concerns. The patient accepts the risks of the treatment plan and had an opportunity to ask questions.   Patient was given opportunity to ask questions. Patient verbalized understanding of the plan and was able to repeat key elements of the plan. All questions were answered to their satisfaction.  Raman Dniya Neuhaus, DNP   I, Raman Elan Brainerd have reviewed all documentation for this visit. The documentation on 8/11//22 for the exam, diagnosis, procedures, and orders are all accurate and complete.     IF YOU HAVE BEEN REFERRED TO A SPECIALIST, IT MAY TAKE 1-2 WEEKS TO SCHEDULE/PROCESS THE REFERRAL. IF YOU HAVE NOT HEARD FROM US/SPECIALIST IN TWO WEEKS, PLEASE GIVE  [place] A CALL AT [phone] X [phone].   THE PATIENT IS ENCOURAGED TO PRACTICE SOCIAL DISTANCING DUE TO THE COVID-19 PANDEMIC.

## 2020-10-10 LAB — URINE CULTURE

## 2020-10-13 ENCOUNTER — Other Ambulatory Visit: Payer: Self-pay | Admitting: Nurse Practitioner

## 2020-10-25 DIAGNOSIS — Z8616 Personal history of COVID-19: Secondary | ICD-10-CM | POA: Diagnosis not present

## 2020-11-01 DIAGNOSIS — Z8616 Personal history of COVID-19: Secondary | ICD-10-CM | POA: Diagnosis not present

## 2020-11-08 DIAGNOSIS — Z8616 Personal history of COVID-19: Secondary | ICD-10-CM | POA: Diagnosis not present

## 2020-11-29 DIAGNOSIS — Z8616 Personal history of COVID-19: Secondary | ICD-10-CM | POA: Diagnosis not present

## 2020-12-08 NOTE — Progress Notes (Signed)
Chief Complaint  Patient presents with   Follow-up    Rm 1, daughter [name]. Here for 3 month memory f/u. Pt reports doing well. Pts daughter reports worsening in memory.       HISTORY OF PRESENT ILLNESS:  12/09/20 ALL:  Amy Whitehead is a 87 y.o. female here today for follow up for concerns of dementia. Labs were unremarkable and MRI showed remote bleeding and atrophy. Dr Dohmeier advised close follow up with PCP and monitoring of medication administration and appetite due to weight loss. She was seen by PCP 08/2020. TSH was elevated and she was diagnosed with UTI. She resides at [facility]. She is on a list to be moved to the memory unit. She reports feeling well. Her daughter, Amy Whitehead, feels memory may be a little worse. No significant changes. She denies behavioral concerns. [name] lives three hours away. She is married with two children who are a good support for her. She has two brothers but neither are available to help care for Amy Whitehead. Amy Whitehead seems to be sleeping well. She is not eating as much. She denies falls, uses Rolator. Nurses administer medications and check on her multiple times every day.   HISTORY (copied from Dr Dohmeier's previous note)  HPI:  Amy Whitehead is a 87 y.o. female is seen here upon PCP referral, FIRST VISIT, for memory loss. 08-12-2020, First fall in November 2021, was in Rehab at [facility].  Her daughter reports she had again fallen at [facility] and was called from the ED, just 2 days ago. Amy. Whitehead is very quiet during this visit, her daughter told me that she lives about 3 hours away she has the power of attorney and healthcare power.  She has noted a mental status decline since her very first fall in November of last year and since she had recently another fall this time injuring the right forehead.  Stitches were reviewed and she also had additionally a wound on the occiput requiring another 6 stitches.  Her primary care provider's note was  from April 01, 2020, the patient was last seen in April of this year.  She had no follow-up with primary care yet.  Her past medical history only with arthritis cataracts and postherpetic neuralgia.  Family history: The patient's mother died a day after her 99[age] birthday, as far as the family knows there was no dementia.  Her father died when she was 48 years old and was in his mid 53s, he died of a brain tumor. Amy. Whitehead has 3 living children 2 brothers and the youngest her daughter, 1 brother one of the brothers has a seizure disorder.  He is the middle child.  They have had no contact with her mother in years.  As I conveyed from the primary care notes she had persistent and progressive cognitive impairment which led to a referral to neurology in spring of this year, TSH vitamin B12 RPR were already checked, she was switched wired to live in a nursing facility but as I understand it was not the memory unit.  Her functional status however has significantly declined as well as her ambulatory abilities.  In November her diagnosis was not just a fall but an encephalopathy related to cellulitis and treatment with antibiotics-that sepsis.  Until that time she had lived by herself and care for herself.   She was scheduled for Dr Terrace Arabia to be seen 06-17-2020, but the visit was cancelled by our office - unclear  why.    Her ED visit note from the 14th, showed that she had elevated creatinine at 1.32 total protein was 5.8 GFR was estimated to be 40 but these are not necessarily reliable values.  Her heart rate was 62 it does not say he has regular or not high blood pressure was had been at 16/57 so rather on the low side I do miss a BUN to tell me if the patient was dehydrated.  MCV was 101.6 which is also not.  Red blood cell count was 3.79K.  X-rays for the ankle mildly displaced fracture at the level of the distal fibula fuses no other fracture pelvis no fracture or dislocation, chest normal cardiac silhouette no  rib fracture.  CT of the head without contrast hematoma and laceration to the outside scalp no bleed CT of the cervical spine normal. Patiet  did not remember her fall and the fall was not witnessed.    REVIEW OF SYSTEMS: Out of a complete 14 system review of symptoms, the patient complains only of the following symptoms, memory loss and all other reviewed systems are negative.   ALLERGIES: No Known Allergies   HOME MEDICATIONS: Outpatient Medications Prior to Visit  Medication Sig Dispense Refill   acetaminophen (TYLENOL) 500 MG tablet Take 500 mg by mouth every 6 (six) hours as needed.     amLODipine (NORVASC) 5 MG tablet Take 1 tablet (5 mg total) by mouth daily. 90 tablet 1   aspirin EC 81 MG tablet Take 81 mg by mouth daily.     Calcium-Vitamin D (CALTRATE 600 PLUS-VIT D PO) Take 2 tablets by mouth 2 (two) times daily.     docusate sodium (COLACE) 100 MG capsule Take 1 capsule (100 mg total) by mouth daily as needed for mild constipation. 60 capsule 2   gabapentin (NEURONTIN) 300 MG capsule TAKE 3 CAPSULES 3 TIMES DAILY 270 capsule 3   levothyroxine (SYNTHROID) 25 MCG tablet Take 1 tablet (25 mcg total) by mouth daily before breakfast. 90 tablet 1   Multiple Vitamin (MULTIVITAMIN WITH MINERALS) TABS tablet Take 1 tablet by mouth daily.     polyethylene glycol powder (MIRALAX) 17 GM/SCOOP powder Take 17 g by mouth 2 (two) times daily as needed for moderate constipation. 255 g 0   QUEtiapine (SEROQUEL) 25 MG tablet Take 1 tablet (25 mg total) by mouth at bedtime. 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  Unsure if uterus was removed, but menses stopped after surgery     FAMILY HISTORY: Family History  Problem Relation Age of Onset   Cancer Father        brain tumor   Cancer Sister        lung   Diabetes Sister      SOCIAL HISTORY: Social History   Socioeconomic History   Marital status: Widowed    Spouse name: Not on file   Number of children: 3   Years of  education: 12   Highest education level: 12th grade  Occupational History   Occupation: order entry/ key punch   Occupation: retired  Tobacco Use   Smoking status: Former    Types: Cigarettes    Quit date: 04/16/1962    Years since quitting: 58.6   Smokeless tobacco: Never   Tobacco comments:    No plans to start again  Vaping Use   Vaping Use: Never used  Substance and Sexual Activity   Alcohol use: No   Drug use: No   Sexual activity: Not Currently  Other Topics Concern   Not on file  Social History Narrative   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 High school graduate.  Lives alone. Has one dog, a Boston terrier named [place]. Walks daily for about an hour. Has 2 sons (one in [place], one in [place]), strained relationship with them.  Has a daughter and 2 grandchildren, age 85 and 32, who live in [place] area.  Strained relationship with son-in-law, who smokes and drinks.  Single, but helps a friend who currently has dementia.  Lives alone.  Drives.  Exercises by walking, especially when weather is nice.      Lives in [place]. [address]nd level. No problems with the stairs. No throw rugs. Smoke alarms. Wears seat belts in the car.       Diet: eats fruit, vegetables, and some meat.      09/09/2020, Patient now lives at [facility].   Social Determinants of Health   Financial Resource Strain: Low Risk    Difficulty of Paying Living Expenses: Not hard at all  Food Insecurity:  No Food Insecurity   Worried About Programme researcher, broadcasting/film/video in the Last Year: Never true   Ran Out of Food in the Last Year: Never true  Transportation Needs: No Transportation Needs   Lack of Transportation (Medical): No   Lack of Transportation (Non-Medical): No  Physical Activity: Inactive   Days of Exercise per Week: 0 days   Minutes of Exercise per Session: 0 min  Stress: No Stress Concern Present   Feeling of Stress : Not at all  Social Connections: Not on file  Intimate Partner Violence: Not on file     PHYSICAL EXAM  Vitals:   12/09/20 1259  BP: 122/64  Pulse: (!) 59  Weight: 114 lb (51.7 kg)  Height: 5\' 3"  (1.6 m)   Body mass index is 20.19 kg/m.  Generalized: Well developed, in no acute distress  Cardiology: normal rate and rhythm, no murmur auscultated  Respiratory: clear to auscultation bilaterally    Neurological examination  Mentation: Alert, she is not oriented to time but is oriented to place and some history taking. Follows all commands speech and language fluent Cranial nerve II-XII: Pupils were equal round reactive to light. Extraocular movements were full, visual field were full on confrontational test. Facial sensation and strength were normal. Head turning and shoulder shrug  were normal and symmetric. Motor: The motor testing reveals 5 over 5 strength of all 4 extremities. Good symmetric motor tone is noted throughout.  Sensory: Sensory testing is intact to soft touch on all 4 extremities. No evidence of extinction is noted.  Coordination: Cerebellar testing reveals good finger-nose-finger and heel-to-shin bilaterally.  Gait and station: Posture very slightly stooped, Gait is short, stable with Rolator   DIAGNOSTIC DATA (LABS, IMAGING, TESTING) - I reviewed patient records, labs, notes, testing and imaging myself where available.  Lab Results  Component Value Date   WBC 6.6 09/09/2020   HGB 12.1 09/09/2020   HCT 37.2 09/09/2020   MCV 93 09/09/2020    PLT 291 09/09/2020      Component Value Date/Time   NA 143 08/10/2020 2022   NA 145 (H) 11/04/2019 1119   K 4.2 08/10/2020 2022   CL 107 08/10/2020 2022   CO2 23 08/10/2020 2022   GLUCOSE 113 (H) 08/10/2020 2022   BUN 20 08/10/2020 2022   BUN 18 11/04/2019 1119   CREATININE 1.32 (H) 08/10/2020 2022   CREATININE 0.96 (H) 03/11/2015 1505   CALCIUM 9.4 08/10/2020 2022   PROT 5.8 (L) 08/10/2020 2022   PROT 6.4 11/04/2019 1119   ALBUMIN 3.2 (L) 08/10/2020 2022   ALBUMIN 4.3 11/04/2019 1119   AST 25 08/10/2020 2022   ALT 13 08/10/2020 2022   ALKPHOS 48 08/10/2020 2022   BILITOT 1.1 08/10/2020 2022   BILITOT 0.5 11/04/2019 1119   GFRNONAA 40 (L) 08/10/2020 2022   GFRNONAA 56 (L) 03/11/2015 1505   GFRAA 77 11/04/2019 1119   GFRAA 65 03/11/2015 1505   Lab Results  Component Value Date   CHOL 184 05/14/2017   HDL 72 05/14/2017   LDLCALC 98 05/14/2017   TRIG 69 05/14/2017   CHOLHDL 2.6 05/14/2017   No results found for: HGBA1C Lab Results  Component Value Date   VITAMINB12 1,641 (H) 08/12/2020   Lab Results  Component Value Date   TSH 11.500 (H) 09/09/2020    MMSE - Mini Mental State Exam 12/09/2020 08/12/2020 12/20/2017  Orientation to time 1 2 5   Orientation to Place 4 4 5   Registration 3 3 3   Attention/ Calculation 1 5 5   Recall 0 0 3  Language- name 2 objects 2 2 2   Language- repeat 1 1 1   Language- follow 3 step command 2 2 3   Language- read & follow direction 1 1 1   Write a sentence 1 1 1   Copy design 1 0 1  Total score 17 21 30      No flowsheet data found.   ASSESSMENT AND PLAN  87 y.o. year old female  has a past medical history of Arthritis, Cataract, and Post herpetic neuralgia (04/17/2011). here with    Alzheimer's disease, unspecified (CODE) (HCC)  Zehra continues to have slight progression with memory loss. Short term memory and recall are most noticeable deficit. She is in the process of being moved to the memory unit at [facility]. We  have discussed possible treatment options including Aricept versus Namenda. Her daughter does not feel possible benefits outweigh potential risks/side effects. We have discussed disease process and safety precautions. I have encouraged Amy Sias to be physically and mentally active. Regular follow up with PCP advised. She will return to see me as needed. She and her daughter verbalize understanding and agreement with this plan.   No orders of the defined types were placed in this encounter.    No orders of the defined types were placed in this encounter.     , MSN, FNP-C 12/09/2020, 2:29 PM  [facility] [address] [place], [address] [phone]

## 2020-12-09 ENCOUNTER — Ambulatory Visit: Payer: Medicare HMO | Admitting: Family Medicine

## 2020-12-09 ENCOUNTER — Encounter: Payer: Self-pay | Admitting: Family Medicine

## 2020-12-09 VITALS — BP 122/64 | HR 59 | Ht 63.0 in | Wt 114.0 lb

## 2020-12-09 DIAGNOSIS — Z1231 Encounter for screening mammogram for malignant neoplasm of breast: Secondary | ICD-10-CM | POA: Diagnosis not present

## 2020-12-09 DIAGNOSIS — G309 Alzheimer's disease, unspecified: Secondary | ICD-10-CM | POA: Diagnosis not present

## 2020-12-09 LAB — HM MAMMOGRAPHY: HM Mammogram: NORMAL (ref 0–4)

## 2020-12-09 NOTE — Patient Instructions (Signed)
Below is our plan:  We will continue to monitor. Please continue close follow up with PCP.   Please make sure you are staying well hydrated. I recommend 50-60 ounces daily. Well balanced diet and regular exercise encouraged. Consistent sleep schedule with 6-8 hours recommended.   Please continue follow up with care team as directed.   Follow up with me as needed  You may receive a survey regarding today's visit. I encourage you to leave honest feed back as I do use this information to improve patient care. Thank you for seeing me today!   Management of Memory Problems   There are some general things you can do to help manage your memory problems.  Your memory may not in fact recover, but by using techniques and strategies you will be able to manage your memory difficulties better.   1)  Establish a routine. Try to establish and then stick to a regular routine.  By doing this, you will get used to what to expect and you will reduce the need to rely on your memory.  Also, try to do things at the same time of day, such as taking your medication or checking your calendar first thing in the morning. Think about think that you can do as a part of a regular routine and make a list.  Then enter them into a daily planner to remind you.  This will help you establish a routine.   2)  Organize your environment. Organize your environment so that it is uncluttered.  Decrease visual stimulation.  Place everyday items such as keys or cell phone in the same place every day (ie.  Basket next to front door) Use post it notes with a brief message to yourself (ie. Turn off light, lock the door) Use labels to indicate where things go (ie. Which cupboards are for food, dishes, etc.) Keep a notepad and pen by the telephone to take messages   3)  Memory Aids A diary or journal/notebook/daily planner Making a list (shopping list, chore list, to do list that needs to be done) Using an alarm as a reminder (kitchen  timer or cell phone alarm) Using cell phone to store information (Notes, Calendar, Reminders) Calendar/White board placed in a prominent position Post-it notes   In order for memory aids to be useful, you need to have good habits.  It's no good remembering to make a note in your journal if you don't remember to look in it.  Try setting aside a certain time of day to look in journal.   4)  Improving mood and managing fatigue. There may be other factors that contribute to memory difficulties.  Factors, such as anxiety, depression and tiredness can affect memory. Regular gentle exercise can help improve your mood and give you more energy. Simple relaxation techniques may help relieve symptoms of anxiety Try to get back to completing activities or hobbies you enjoyed doing in the past. Learn to pace yourself through activities to decrease fatigue. Find out about some local support groups where you can share experiences with others. Try and achieve 7-8 hours of sleep at night.

## 2020-12-13 DIAGNOSIS — Z8616 Personal history of COVID-19: Secondary | ICD-10-CM | POA: Diagnosis not present

## 2020-12-17 ENCOUNTER — Encounter: Payer: Self-pay | Admitting: Nurse Practitioner

## 2020-12-30 ENCOUNTER — Other Ambulatory Visit: Payer: Self-pay | Admitting: Nurse Practitioner

## 2021-02-16 ENCOUNTER — Other Ambulatory Visit: Payer: Self-pay | Admitting: Nurse Practitioner

## 2021-02-28 DIAGNOSIS — Z20822 Contact with and (suspected) exposure to covid-19: Secondary | ICD-10-CM | POA: Diagnosis not present

## 2021-03-07 DIAGNOSIS — Z20828 Contact with and (suspected) exposure to other viral communicable diseases: Secondary | ICD-10-CM | POA: Diagnosis not present

## 2021-03-14 DIAGNOSIS — Z20822 Contact with and (suspected) exposure to covid-19: Secondary | ICD-10-CM | POA: Diagnosis not present

## 2021-03-17 ENCOUNTER — Other Ambulatory Visit: Payer: Self-pay

## 2021-03-17 ENCOUNTER — Ambulatory Visit (INDEPENDENT_AMBULATORY_CARE_PROVIDER_SITE_OTHER): Payer: Medicare HMO | Admitting: Nurse Practitioner

## 2021-03-17 ENCOUNTER — Encounter: Payer: Self-pay | Admitting: Nurse Practitioner

## 2021-03-17 VITALS — BP 118/62 | HR 64 | Temp 97.5°F | Ht 63.0 in | Wt 120.4 lb

## 2021-03-17 DIAGNOSIS — Z23 Encounter for immunization: Secondary | ICD-10-CM | POA: Diagnosis not present

## 2021-03-17 DIAGNOSIS — N390 Urinary tract infection, site not specified: Secondary | ICD-10-CM

## 2021-03-17 DIAGNOSIS — R011 Cardiac murmur, unspecified: Secondary | ICD-10-CM | POA: Diagnosis not present

## 2021-03-17 DIAGNOSIS — R82998 Other abnormal findings in urine: Secondary | ICD-10-CM | POA: Diagnosis not present

## 2021-03-17 DIAGNOSIS — I10 Essential (primary) hypertension: Secondary | ICD-10-CM | POA: Diagnosis not present

## 2021-03-17 DIAGNOSIS — E039 Hypothyroidism, unspecified: Secondary | ICD-10-CM

## 2021-03-17 LAB — POCT URINALYSIS DIPSTICK
Bilirubin, UA: NEGATIVE
Glucose, UA: NEGATIVE
Ketones, UA: NEGATIVE
Nitrite, UA: POSITIVE
Protein, UA: NEGATIVE
Spec Grav, UA: 1.025 (ref 1.010–1.025)
Urobilinogen, UA: 0.2 E.U./dL
pH, UA: 5.5 (ref 5.0–8.0)

## 2021-03-17 MED ORDER — AMLODIPINE BESYLATE 5 MG PO TABS: 5.0000 mg | ORAL_TABLET | Freq: Every day | ORAL | 1 refills | Status: AC

## 2021-03-17 MED ORDER — LEVOTHYROXINE SODIUM 25 MCG PO TABS: 25.0000 ug | ORAL_TABLET | Freq: Every day | ORAL | 1 refills | Status: DC

## 2021-03-17 MED ORDER — NITROFURANTOIN MONOHYD MACRO 100 MG PO CAPS: 100.0000 mg | ORAL_CAPSULE | Freq: Two times a day (BID) | ORAL | 0 refills | Status: AC

## 2021-03-17 NOTE — Progress Notes (Signed)
I,Tianna Badgett,acting as a Education administrator for [employer], FNP.,have documented all relevant documentation on the behalf of Minette Brine, FNP,as directed by  Minette Brine, FNP while in the presence of Minette Brine, [place].  This visit occurred during the SARS-CoV-2 public health emergency.  Safety protocols were in place, including screening questions prior to the visit, additional usage of staff PPE, and extensive cleaning of exam room while observing appropriate contact time as indicated for disinfecting solutions.  Subjective:     Patient ID: Amy Whitehead , female    DOB: 1933-09-02 , 88 y.o.   MRN: [id number]   Chief Complaint  Patient presents with   Thyroid Problem         HPI  Patient is here for follow up for thyroid. Continues to stay at [place] at [facility].    Thyroid Problem Presents for follow-up visit. Patient reports no anxiety.    Past Medical History:  Diagnosis Date   Arthritis    Cataract    Post herpetic neuralgia 04/17/2011     Family History  Problem Relation Age of Onset   Cancer Father        brain tumor   Cancer Sister        lung   Diabetes Sister      Current Outpatient Medications:    acetaminophen (TYLENOL) 500 MG tablet, Take 500 mg by mouth every 6 (six) hours as needed., Disp: , Rfl:    amLODipine (NORVASC) 5 MG tablet, Take 1 tablet (5 mg total) by mouth daily., Disp: 90 tablet, Rfl: 1   aspirin EC 81 MG tablet, Take 81 mg by mouth daily., Disp: , Rfl:    Calcium-Vitamin D (CALTRATE 600 PLUS-VIT D PO), Take 2 tablets by mouth 2 (two) times daily., Disp: , Rfl:    gabapentin (NEURONTIN) 300 MG capsule, TAKE 3 CAPSULES 3 TIMES DAILY, Disp: 270 capsule, Rfl: 3   levothyroxine (SYNTHROID) 50 MCG tablet, Take 1 tablet (50 mcg total) by mouth daily before breakfast., Disp: 90 tablet, Rfl: 1   Multiple Vitamin (MULTIVITAMIN WITH MINERALS) TABS tablet, Take 1 tablet by mouth daily., Disp: , Rfl:    polyethylene glycol powder (MIRALAX) 17 GM/SCOOP  powder, Take 17 g by mouth 2 (two) times daily as needed for moderate constipation., Disp: 255 g, Rfl: 0   QUEtiapine (SEROQUEL) 25 MG tablet, TAKE ONE TABLET AT BEDTIME, Disp: 90 tablet, Rfl: 1  Current Facility-Administered Medications:    cefTRIAXone (ROCEPHIN) injection 1 g, 1 g, Intramuscular, Once, Ghumman, Ramandeep, NP   No Known Allergies   Review of Systems  Constitutional: Negative.   Respiratory: Negative.    Cardiovascular: Negative.   Gastrointestinal: Negative.   Neurological: Negative.   Psychiatric/Behavioral: Negative.  The patient is not nervous/anxious.     Today's Vitals   03/17/21 1606  BP: 118/62  Pulse: 64  Temp: (!) 97.5 F (36.4 C)  TempSrc: Oral  Weight: 120 lb 6.4 oz (54.6 kg)  Height: _0  (1.6 m)   Body mass index is 21.33 kg/m.  Wt Readings from Last 3 Encounters:  03/17/21 120 lb 6.4 oz (54.6 kg)  12/09/20 114 lb (51.7 kg)  10/07/20 109 lb 12.8 oz (49.8 kg)    Objective:  Physical Exam Vitals reviewed.  Constitutional:      General: She is not in acute distress.    Appearance: Normal appearance.  Cardiovascular:     Rate and Rhythm: Normal rate and regular rhythm.     Pulses:  Normal pulses.     Heart sounds: Murmur heard.  Pulmonary:     Effort: Pulmonary effort is normal. No respiratory distress.     Breath sounds: Normal breath sounds. No wheezing.  Skin:    General: Skin is warm and dry.     Capillary Refill: Capillary refill takes less than 2 seconds.  Neurological:     General: No focal deficit present.     Mental Status: She is alert and oriented to person, place, and time.     Cranial Nerves: No cranial nerve deficit.     Motor: No weakness.  Psychiatric:        Mood and Affect: Mood normal.        Behavior: Behavior normal. Behavior is not agitated. Behavior is cooperative.        Thought Content: Thought content normal.        Cognition and Memory: She does not exhibit impaired recent memory.        Judgment:  Judgment is not inappropriate.        Assessment And Plan:     1. Acquired hypothyroidism Comments: Thyroid levels was abnormal but she did not return for recheck. Will check again today - TSH - T4 - T3, free - BMP8+eGFR  2. Essential hypertension Comments: Blood pressure is well controlled. Continue current medications - amLODipine (NORVASC) 5 MG tablet; Take 1 tablet (5 mg total) by mouth daily.  Dispense: 90 tablet; Refill: 1 - BMP8+eGFR - CBC with Differential/Platelet  3. Murmur Comments: Will refer to Cardiology for further evaluation.  - Ambulatory referral to Cardiology  4. Encounter for immunization - Flu Vaccine QUAD High Dose(Fluad)  5. Urinary tract infection without hematuria, site unspecified - Culture, Urine - nitrofurantoin, macrocrystal-monohydrate, (MACROBID) 100 MG capsule; Take 1 capsule (100 mg total) by mouth 2 (two) times daily for 5 days.  Dispense: 10 capsule; Refill: 0  6. Leukocytes in urine - POCT Urinalysis Dipstick (45364)     Patient was given opportunity to ask questions. Patient verbalized understanding of the plan and was able to repeat key elements of the plan. All questions were answered to their satisfaction.  Minette Brine, FNP   I, Minette Brine, FNP, have reviewed all documentation for this visit. The documentation on 03/17/21 for the exam, diagnosis, procedures, and orders are all accurate and complete.   IF YOU HAVE BEEN REFERRED TO A SPECIALIST, IT MAY TAKE 1-2 WEEKS TO SCHEDULE/PROCESS THE REFERRAL. IF YOU HAVE NOT HEARD FROM US/SPECIALIST IN TWO WEEKS, PLEASE GIVE [place] A CALL AT [phone].   THE PATIENT IS ENCOURAGED TO PRACTICE SOCIAL DISTANCING DUE TO THE COVID-19 PANDEMIC.

## 2021-03-17 NOTE — Patient Instructions (Signed)

## 2021-03-18 LAB — CBC WITH DIFFERENTIAL/PLATELET
Basophils Absolute: 0 10*3/uL (ref 0.0–0.2)
Basos: 1 %
EOS (ABSOLUTE): 0.2 10*3/uL (ref 0.0–0.4)
Eos: 4 %
Hematocrit: 33.8 % — ABNORMAL LOW (ref 34.0–46.6)
Hemoglobin: 11 g/dL — ABNORMAL LOW (ref 11.1–15.9)
Immature Grans (Abs): 0 10*3/uL (ref 0.0–0.1)
Immature Granulocytes: 0 %
Lymphocytes Absolute: 1.3 10*3/uL (ref 0.7–3.1)
Lymphs: 26 %
MCH: 26.6 pg (ref 26.6–33.0)
MCHC: 32.5 g/dL (ref 31.5–35.7)
MCV: 82 fL (ref 79–97)
Monocytes Absolute: 0.6 10*3/uL (ref 0.1–0.9)
Monocytes: 12 %
Neutrophils Absolute: 2.8 10*3/uL (ref 1.4–7.0)
Neutrophils: 57 %
Platelets: 226 10*3/uL (ref 150–450)
RBC: 4.14 x10E6/uL (ref 3.77–5.28)
RDW: 16.4 % — ABNORMAL HIGH (ref 11.7–15.4)
WBC: 4.9 10*3/uL (ref 3.4–10.8)

## 2021-03-18 LAB — BMP8+EGFR
BUN/Creatinine Ratio: 19 (ref 12–28)
BUN: 23 mg/dL (ref 8–27)
CO2: 27 mmol/L (ref 20–29)
Calcium: 10.2 mg/dL (ref 8.7–10.3)
Chloride: 103 mmol/L (ref 96–106)
Creatinine, Ser: 1.22 mg/dL — ABNORMAL HIGH (ref 0.57–1.00)
Glucose: 65 mg/dL — ABNORMAL LOW (ref 70–99)
Potassium: 3.9 mmol/L (ref 3.5–5.2)
Sodium: 141 mmol/L (ref 134–144)
eGFR: 43 mL/min/{1.73_m2} — ABNORMAL LOW (ref >59)

## 2021-03-18 LAB — T3, FREE: T3, Free: 1.8 pg/mL — ABNORMAL LOW (ref 2.0–4.4)

## 2021-03-18 LAB — TSH: TSH: 5.05 u[IU]/mL — ABNORMAL HIGH (ref 0.450–4.500)

## 2021-03-18 LAB — T4: T4, Total: 5.7 ug/dL (ref 4.5–12.0)

## 2021-03-20 LAB — URINE CULTURE

## 2021-03-21 ENCOUNTER — Other Ambulatory Visit: Payer: Self-pay | Admitting: Nurse Practitioner

## 2021-03-21 DIAGNOSIS — E039 Hypothyroidism, unspecified: Secondary | ICD-10-CM

## 2021-03-21 DIAGNOSIS — Z20828 Contact with and (suspected) exposure to other viral communicable diseases: Secondary | ICD-10-CM | POA: Diagnosis not present

## 2021-03-21 MED ORDER — LEVOTHYROXINE SODIUM 50 MCG PO TABS: 50.0000 ug | ORAL_TABLET | Freq: Every day | ORAL | 1 refills | Status: DC

## 2021-04-13 ENCOUNTER — Telehealth: Payer: Self-pay

## 2021-04-13 NOTE — Telephone Encounter (Signed)
Error

## 2021-04-21 ENCOUNTER — Other Ambulatory Visit: Payer: Medicare HMO

## 2021-04-25 ENCOUNTER — Other Ambulatory Visit (INDEPENDENT_AMBULATORY_CARE_PROVIDER_SITE_OTHER): Payer: Medicare HMO

## 2021-04-25 ENCOUNTER — Other Ambulatory Visit: Payer: Self-pay

## 2021-04-25 DIAGNOSIS — E039 Hypothyroidism, unspecified: Secondary | ICD-10-CM | POA: Diagnosis not present

## 2021-04-25 DIAGNOSIS — R82998 Other abnormal findings in urine: Secondary | ICD-10-CM | POA: Diagnosis not present

## 2021-04-25 DIAGNOSIS — R4182 Altered mental status, unspecified: Secondary | ICD-10-CM

## 2021-04-25 LAB — POCT URINALYSIS DIPSTICK
Bilirubin, UA: NEGATIVE
Blood, UA: NEGATIVE
Glucose, UA: NEGATIVE
Ketones, UA: NEGATIVE
Nitrite, UA: NEGATIVE
Protein, UA: NEGATIVE
Spec Grav, UA: 1.02 (ref 1.010–1.025)
Urobilinogen, UA: 0.2 E.U./dL
pH, UA: 5.5 (ref 5.0–8.0)

## 2021-04-26 LAB — TSH: TSH: 4.78 u[IU]/mL — ABNORMAL HIGH (ref 0.450–4.500)

## 2021-04-26 LAB — T3, FREE: T3, Free: 2.1 pg/mL (ref 2.0–4.4)

## 2021-04-26 LAB — T4: T4, Total: 5.6 ug/dL (ref 4.5–12.0)

## 2021-04-27 ENCOUNTER — Other Ambulatory Visit: Payer: Self-pay | Admitting: Nurse Practitioner

## 2021-04-27 DIAGNOSIS — E039 Hypothyroidism, unspecified: Secondary | ICD-10-CM

## 2021-04-27 LAB — URINE CULTURE

## 2021-04-27 MED ORDER — LEVOTHYROXINE SODIUM 75 MCG PO TABS: 75.0000 ug | ORAL_TABLET | Freq: Every day | ORAL | 5 refills | Status: DC

## 2021-05-04 ENCOUNTER — Emergency Department (HOSPITAL_COMMUNITY): Payer: Medicare HMO

## 2021-05-04 ENCOUNTER — Other Ambulatory Visit: Payer: Self-pay

## 2021-05-04 ENCOUNTER — Encounter (HOSPITAL_COMMUNITY): Payer: Self-pay

## 2021-05-04 ENCOUNTER — Emergency Department (HOSPITAL_COMMUNITY)
Admission: EM | Admit: 2021-05-04 | Discharge: 2021-05-04 | Disposition: A | Discharge status: Skilled Nursing Facility | Payer: Medicare HMO | Attending: Emergency Medicine | Admitting: Emergency Medicine

## 2021-05-04 DIAGNOSIS — R4182 Altered mental status, unspecified: Secondary | ICD-10-CM | POA: Diagnosis not present

## 2021-05-04 DIAGNOSIS — I10 Essential (primary) hypertension: Secondary | ICD-10-CM | POA: Diagnosis not present

## 2021-05-04 DIAGNOSIS — Y92129 Unspecified place in nursing home as the place of occurrence of the external cause: Secondary | ICD-10-CM | POA: Diagnosis not present

## 2021-05-04 DIAGNOSIS — I959 Hypotension, unspecified: Secondary | ICD-10-CM | POA: Diagnosis not present

## 2021-05-04 DIAGNOSIS — R410 Disorientation, unspecified: Secondary | ICD-10-CM | POA: Diagnosis not present

## 2021-05-04 DIAGNOSIS — W01198A Fall on same level from slipping, tripping and stumbling with subsequent striking against other object, initial encounter: Secondary | ICD-10-CM | POA: Diagnosis not present

## 2021-05-04 DIAGNOSIS — R9431 Abnormal electrocardiogram [ECG] [EKG]: Secondary | ICD-10-CM | POA: Diagnosis not present

## 2021-05-04 DIAGNOSIS — M4312 Spondylolisthesis, cervical region: Secondary | ICD-10-CM | POA: Diagnosis not present

## 2021-05-04 DIAGNOSIS — W19XXXA Unspecified fall, initial encounter: Secondary | ICD-10-CM

## 2021-05-04 DIAGNOSIS — S0101XA Laceration without foreign body of scalp, initial encounter: Secondary | ICD-10-CM | POA: Insufficient documentation

## 2021-05-04 DIAGNOSIS — S0990XA Unspecified injury of head, initial encounter: Secondary | ICD-10-CM | POA: Diagnosis not present

## 2021-05-04 DIAGNOSIS — Z7982 Long term (current) use of aspirin: Secondary | ICD-10-CM | POA: Insufficient documentation

## 2021-05-04 DIAGNOSIS — S199XXA Unspecified injury of neck, initial encounter: Secondary | ICD-10-CM | POA: Diagnosis not present

## 2021-05-04 DIAGNOSIS — M50323 Other cervical disc degeneration at C6-C7 level: Secondary | ICD-10-CM | POA: Diagnosis not present

## 2021-05-04 DIAGNOSIS — F0390 Unspecified dementia without behavioral disturbance: Secondary | ICD-10-CM | POA: Diagnosis not present

## 2021-05-04 DIAGNOSIS — R0902 Hypoxemia: Secondary | ICD-10-CM | POA: Diagnosis not present

## 2021-05-04 DIAGNOSIS — Z7401 Bed confinement status: Secondary | ICD-10-CM | POA: Diagnosis not present

## 2021-05-04 LAB — CBC WITH DIFFERENTIAL/PLATELET
Abs Immature Granulocytes: 0.01 10*3/uL (ref 0.00–0.07)
Basophils Absolute: 0 10*3/uL (ref 0.0–0.1)
Basophils Relative: 1 %
Eosinophils Absolute: 0.3 10*3/uL (ref 0.0–0.5)
Eosinophils Relative: 5 %
HCT: 42.5 % (ref 36.0–46.0)
Hemoglobin: 12.9 g/dL (ref 12.0–15.0)
Immature Granulocytes: 0 %
Lymphocytes Relative: 19 %
Lymphs Abs: 1.2 10*3/uL (ref 0.7–4.0)
MCH: 28.2 pg (ref 26.0–34.0)
MCHC: 30.4 g/dL (ref 30.0–36.0)
MCV: 93 fL (ref 80.0–100.0)
Monocytes Absolute: 0.6 10*3/uL (ref 0.1–1.0)
Monocytes Relative: 9 %
Neutro Abs: 4 10*3/uL (ref 1.7–7.7)
Neutrophils Relative %: 66 %
Platelets: 214 10*3/uL (ref 150–400)
RBC: 4.57 MIL/uL (ref 3.87–5.11)
RDW: 19.6 % — ABNORMAL HIGH (ref 11.5–15.5)
WBC: 6 10*3/uL (ref 4.0–10.5)
nRBC: 0 % (ref 0.0–0.2)

## 2021-05-04 LAB — COMPREHENSIVE METABOLIC PANEL
ALT: 18 U/L (ref 0–44)
AST: 32 U/L (ref 15–41)
Albumin: 3.6 g/dL (ref 3.5–5.0)
Alkaline Phosphatase: 54 U/L (ref 38–126)
Anion gap: 8 (ref 5–15)
BUN: 17 mg/dL (ref 8–23)
CO2: 29 mmol/L (ref 22–32)
Calcium: 10.7 mg/dL — ABNORMAL HIGH (ref 8.9–10.3)
Chloride: 105 mmol/L (ref 98–111)
Creatinine, Ser: 1.08 mg/dL — ABNORMAL HIGH (ref 0.44–1.00)
GFR, Estimated: 50 mL/min — ABNORMAL LOW (ref >60)
Glucose, Bld: 93 mg/dL (ref 70–99)
Potassium: 3.8 mmol/L (ref 3.5–5.1)
Sodium: 142 mmol/L (ref 135–145)
Total Bilirubin: 0.4 mg/dL (ref 0.3–1.2)
Total Protein: 6.7 g/dL (ref 6.5–8.1)

## 2021-05-04 LAB — CK: Total CK: 178 U/L (ref 38–234)

## 2021-05-04 MED ORDER — LIDOCAINE-EPINEPHRINE (PF) 2 %-1:200000 IJ SOLN: 10.0000 mL | Freq: Once | INTRAMUSCULAR | Status: DC

## 2021-05-04 MED ORDER — SODIUM CHLORIDE 0.9 % IV BOLUS
1000.0000 mL | Freq: Once | INTRAVENOUS | Status: AC
Start: 2021-05-04 — End: 2021-05-04
  Administered 2021-05-04: 1000 mL via INTRAVENOUS

## 2021-05-04 NOTE — ED Provider Notes (Signed)
[facility] EMERGENCY DEPARTMENT Provider Note   CSN: [id number] Arrival date & time: 05/04/21  0854     History  Chief Complaint  Patient presents with   Quentin Cornwall LEONETTA MCGIVERN is a 88 y.o. female.  88 yo F with a chief complaints of fall.  Patient is demented at baseline is unsure exactly what happened.  Per her report she was actually outside and was trying to get back and and no one would let her head and she fell and struck the back of her head.  She tells me that she head butted the base of the bed.  She has trouble describing how exactly that occurred.  She was found on the floor reportedly by EMS and told them that they had somehow taken her bed.  She was found next to the bed.  There was a small trail of blood from the bathroom to the bed and so the presumption is that she fell in the bathroom and then made her back next to the bed.  Not sure how long she was down for.  She has no complaints.  Level 5 caveat dementia.   Fall      Home Medications Prior to Admission medications   Medication Sig Start Date End Date Taking? Authorizing Provider  acetaminophen (TYLENOL) 500 MG tablet Take 500 mg by mouth every 6 (six) hours as needed.    [provider]  amLODipine (NORVASC) 5 MG tablet Take 1 tablet (5 mg total) by mouth daily. 03/17/21   Arnette Felts, FNP  aspirin EC 81 MG tablet Take 81 mg by mouth daily.    [provider]  Calcium-Vitamin D (CALTRATE 600 PLUS-VIT D PO) Take 2 tablets by mouth 2 (two) times daily.    [provider]  gabapentin (NEURONTIN) 300 MG capsule TAKE 3 CAPSULES 3 TIMES DAILY 02/16/21   Arnette Felts, FNP  levothyroxine (SYNTHROID) 75 MCG tablet Take 1 tablet (75 mcg total) by mouth daily before breakfast. 04/27/21   Arnette Felts, FNP  Multiple Vitamin (MULTIVITAMIN WITH MINERALS) TABS tablet Take 1 tablet by mouth daily.    [provider]  polyethylene glycol powder (MIRALAX) 17 GM/SCOOP powder  Take 17 g by mouth 2 (two) times daily as needed for moderate constipation. 01/20/20   Almon Hercules, MD  QUEtiapine (SEROQUEL) 25 MG tablet TAKE ONE TABLET AT BEDTIME 12/31/20   Arnette Felts, FNP      Allergies    Patient has no known allergies.    Review of Systems   Review of Systems  Physical Exam Updated Vital Signs BP (!) 169/74    Pulse (!) 56    Temp 98 F (36.7 C) (Oral)    Resp 15    Ht  (1.6 m)    Wt 59 kg    SpO2 100%    BMI 23.03 kg/m  Physical Exam Vitals and nursing note reviewed.  Constitutional:      General: She is not in acute distress.    Appearance: She is well-developed. She is not diaphoretic.  HENT:     Head: Normocephalic.     Comments: Approximately 1 cm laceration to the posterior occiput just to the left of midline. Eyes:     Pupils: Pupils are equal, round, and reactive to light.  Cardiovascular:     Rate and Rhythm: Normal rate and regular rhythm.     Heart sounds: No murmur heard.   No friction rub.  No gallop.  Pulmonary:     Effort: Pulmonary effort is normal.     Breath sounds: No wheezing or rales.  Abdominal:     General: There is no distension.     Palpations: Abdomen is soft.     Tenderness: There is no abdominal tenderness.  Musculoskeletal:        General: No tenderness.     Cervical back: Normal range of motion and neck supple.  Skin:    General: Skin is warm and dry.  Neurological:     Mental Status: She is alert and oriented to person, place, and time.  Psychiatric:        Behavior: Behavior normal.    ED Results / Procedures / Treatments   Labs (all labs ordered are listed, but only abnormal results are displayed) Labs Reviewed  CBC WITH DIFFERENTIAL/PLATELET - Abnormal; Notable for the following components:      Result Value   RDW 19.6 (*)    All other components within normal limits  COMPREHENSIVE METABOLIC PANEL - Abnormal; Notable for the following components:   Creatinine, Ser 1.08 (*)    Calcium 10.7 (*)     GFR, Estimated 50 (*)    All other components within normal limits  CK    EKG EKG Interpretation  Date/Time:  Wednesday May 04 2021 09:13:19 EST Ventricular Rate:  55 PR Interval:  159 QRS Duration: 97 QT Interval:  322 QTC Calculation: 308 R Axis:   55 Text Interpretation: Sinus rhythm Posterior infarct, old Nonspecific T abnormalities, inferior leads background noise TECHNICALLY DIFFICULT Otherwise no significant change Confirmed by Melene Plan [phone]) on 05/04/2021 9:15:29 AM  Radiology CT Head Wo Contrast  Result Date: 05/04/2021 CLINICAL DATA:  Neck trauma (Age >= 65y); Head trauma, minor (Age >= 65y) EXAM: CT HEAD WITHOUT CONTRAST CT CERVICAL SPINE WITHOUT CONTRAST TECHNIQUE: Multidetector CT imaging of the head and cervical spine was performed following the standard protocol without intravenous contrast. Multiplanar CT image reconstructions of the cervical spine were also generated. RADIATION DOSE REDUCTION: This exam was performed according to the departmental dose-optimization program which includes automated exposure control, adjustment of the mA and/or kV according to patient size and/or use of iterative reconstruction technique. COMPARISON:  August 10, 2020. FINDINGS: CT HEAD FINDINGS Brain: No evidence of acute infarction, hemorrhage, hydrocephalus, extra-axial collection or mass lesion/mass effect. Vascular: Calcific atherosclerosis. Skull: No acute fracture. Sinuses/Orbits: Visualized sinuses are clear. No acute orbital findings. Other: No mastoid effusions CT CERVICAL SPINE FINDINGS Alignment: Similar alignment. Similar slight anterolisthesis of C5 on C6. Skull base and vertebrae: Vertebral body heights are maintained. No evidence of acute fracture. Soft tissues and spinal canal: No prevertebral fluid or swelling. No visible canal hematoma. Disc levels: Similar degenerative disc disease, greatest at C6-C7 with disc height loss and posterior endplate spurring. Similar multilevel  facet uncovertebral hypertrophy with varying degrees of neural foraminal stenosis. Upper chest: Visualized lung apices are clear. Other: Calcific atherosclerosis, including at bilateral carotid bifurcations. IMPRESSION: 1. No evidence of acute intracranial abnormality. 2. No evidence of acute fracture or traumatic malalignment. Electronically Signed   By: Feliberto Harts M.D.   On: 05/04/2021 10:07   CT Cervical Spine Wo Contrast  Result Date: 05/04/2021 CLINICAL DATA:  Neck trauma (Age >= 65y); Head trauma, minor (Age >= 65y) EXAM: CT HEAD WITHOUT CONTRAST CT CERVICAL SPINE WITHOUT CONTRAST TECHNIQUE: Multidetector CT imaging of the head and cervical spine was performed following the standard protocol without intravenous contrast. Multiplanar CT image  reconstructions of the cervical spine were also generated. RADIATION DOSE REDUCTION: This exam was performed according to the departmental dose-optimization program which includes automated exposure control, adjustment of the mA and/or kV according to patient size and/or use of iterative reconstruction technique. COMPARISON:  August 10, 2020. FINDINGS: CT HEAD FINDINGS Brain: No evidence of acute infarction, hemorrhage, hydrocephalus, extra-axial collection or mass lesion/mass effect. Vascular: Calcific atherosclerosis. Skull: No acute fracture. Sinuses/Orbits: Visualized sinuses are clear. No acute orbital findings. Other: No mastoid effusions CT CERVICAL SPINE FINDINGS Alignment: Similar alignment. Similar slight anterolisthesis of C5 on C6. Skull base and vertebrae: Vertebral body heights are maintained. No evidence of acute fracture. Soft tissues and spinal canal: No prevertebral fluid or swelling. No visible canal hematoma. Disc levels: Similar degenerative disc disease, greatest at C6-C7 with disc height loss and posterior endplate spurring. Similar multilevel facet uncovertebral hypertrophy with varying degrees of neural foraminal stenosis. Upper chest:  Visualized lung apices are clear. Other: Calcific atherosclerosis, including at bilateral carotid bifurcations. IMPRESSION: 1. No evidence of acute intracranial abnormality. 2. No evidence of acute fracture or traumatic malalignment. Electronically Signed   By: Feliberto Harts M.D.   On: 05/04/2021 10:07    Procedures Procedures    Medications Ordered in ED Medications  sodium chloride 0.9 % bolus 1,000 mL (has no administration in time range)  lidocaine-EPINEPHrine (XYLOCAINE W/EPI) 2 %-1:200000 (PF) injection 10 mL (10 mLs Intradermal Not Given 05/04/21 1035)    ED Course/ Medical Decision Making/ A&P                           Medical Decision Making Amount and/or Complexity of Data Reviewed Labs: ordered. Radiology: ordered. ECG/medicine tests: ordered.  Risk Prescription drug management.   88 yo F with a chief complaints of presumed fall.  The patient was found on the ground this morning by nursing staff.  She has a history of dementia and was unable to provide much history.  She does have a laceration to the occiput.  Will obtain a CT of the head and C-spine though she has no significant tenderness anywhere on exam.  We will obtain a laboratory evaluation to assess for renal dysfunction or rhabdomyolysis.  Bolus of IV fluids.  I reviewed the patient's immunization records and she had her Tdap last updated in 2017.  No anemia, no leukocytosis, CK is normal, no significant change from her baseline renal function though slightly improved from last check.  No significant electrolyte abnormalities.  EKG without concerning finding for arrhythmia.  CT of the head and C-spine without obvious acute pathology.  We will discharge patient home.  PCP follow-up.  10:44 AM:  I have discussed the diagnosis/risks/treatment options with the patient.  Evaluation and diagnostic testing in the emergency department does not suggest an emergent condition requiring admission or immediate intervention  beyond what has been performed at this time.  They will follow up with  PCP. We also discussed returning to the ED immediately if new or worsening sx occur. We discussed the sx which are most concerning (e.g., sudden worsening pain, fever, inability to tolerate by mouth) that necessitate immediate return. Medications administered to the patient during their visit and any new prescriptions provided to the patient are listed below.  Medications given during this visit Medications  sodium chloride 0.9 % bolus 1,000 mL (has no administration in time range)  lidocaine-EPINEPHrine (XYLOCAINE W/EPI) 2 %-1:200000 (PF) injection 10 mL (10 mLs Intradermal Not Given  05/04/21 1035)     The patient appears reasonably screen and/or stabilized for discharge and I doubt any other medical condition or other [facility] requiring further screening, evaluation, or treatment in the ED at this time prior to discharge.          Final Clinical Impression(s) / ED Diagnoses Final diagnoses:  Laceration of scalp, initial encounter  Fall, initial encounter    Rx / DC Orders ED Discharge Orders     None         Melene PlanFloyd, Davonda Ausley, DO 05/04/21 1044

## 2021-05-04 NOTE — Discharge Instructions (Signed)
Follow up with your family doc.  °

## 2021-05-04 NOTE — ED Triage Notes (Signed)
Pt BIB [facility] from [place] found in the floor by her bed this morning. There was a trail of blood from the bathroom to the bed, unsure if she fell in the bathroom and crawled to the bed or what exactly happened. Pt has dementia and stated she laid in the floor because they did not give her bed to lay in. Pt denies any pain at this time. Pt has Laceration to the posterior part of the head.  ? ? ?

## 2021-05-04 NOTE — ED Provider Notes (Signed)
?  Physical Exam  ?BP (!) 163/67   Pulse (!) 55   Temp 98 ?F (36.7 ?C) (Oral)   Resp 15   Ht 5\' 3"  (1.6 m)   Wt 59 kg   SpO2 100%   BMI 23.03 kg/m?  ? ?Physical Exam ? ?Procedures  ? .Laceration Repair ? ?Date/Time: 05/04/2021 10:22 AM ?Performed by: 07/04/2021, PA-C ?Authorized by: Paris Lore, PA-C  ? ?Consent:  ?  Consent obtained:  Verbal ?  Consent given by:  Patient ?  Risks discussed:  Infection, need for additional repair, pain, poor cosmetic result and poor wound healing ?  Alternatives discussed:  No treatment and delayed treatment ?Universal protocol:  ?  Procedure explained and questions answered to patient or proxy's satisfaction: yes   ?  Relevant documents present and verified: yes   ?  Test results available: yes   ?  Imaging studies available: yes   ?  Required blood products, implants, devices, and special equipment available: yes   ?  Site/side marked: yes   ?  Immediately prior to procedure, a time out was called: yes   ?  Patient identity confirmed:  Verbally with patient and arm band ?Anesthesia:  ?  Anesthesia method:  None ?Laceration details:  ?  Location:  Scalp ?  Scalp location:  Occipital ?  Length (cm):  1.5 ?Exploration:  ?  Wound extent: no foreign bodies/material noted, no tendon damage noted and no underlying fracture noted   ?Treatment:  ?  Area cleansed with:  Betadine ?  Amount of cleaning:  Standard ?  Irrigation solution:  Sterile saline ?Skin repair:  ?  Repair method:  Staples ?  Number of staples:  2 ?Approximation:  ?  Approximation:  Close ?Repair type:  ?  Repair type:  Simple ?Post-procedure details:  ?  Dressing:  Open (no dressing) ?  Procedure completion:  Tolerated well, no immediate complications ?Comments:  ?   Procedure performed by PA student with supervision of this provider, with the patient's permission. Patient tolerated the procedure well.  ? ?ED Course / MDM  ?  ?Medical Decision Making ?Amount and/or Complexity of Data  Reviewed ?ECG/medicine tests: ordered. ? ?Risk ?Prescription drug management. ? ? ? ? ?This chart was dictated using voice recognition software, Dragon. Despite the best efforts of this provider to proofread and correct errors, errors may still occur which can change documentation meaning. ? ? ? ? ? ?  ?Paris Lore, PA-C ?05/04/21 1023 ? ?  ?07/04/21, DO ?05/04/21 1044 ? ?

## 2021-05-04 NOTE — ED Notes (Signed)
[facility] CALLED FOR PT NO ETA GIVEN  

## 2021-05-05 ENCOUNTER — Telehealth: Payer: Self-pay

## 2021-05-05 NOTE — Telephone Encounter (Signed)
Transition Care Management Follow-up Telephone Call ?Date of discharge and from where: 05/05/2021. [place]  ?How have you been since you were released from the hospital? Pt daughter states she was not aware until called from facility after the fact. She does not know how she is doing right now, daughter reports her being very fatigue. Has not spoke with her mother since Saturday.  ?Any questions or concerns? No ? ?Items Reviewed: ?Did the pt receive and understand the discharge instructions provided? Yes  ?Medications obtained and verified? Yes  ?Other? Yes  ?Any new allergies since your discharge? No  ?Dietary orders reviewed? Yes ?Do you have support at home? No  ? ?Home Care and Equipment/Supplies: ?Were home health services ordered? no ?If so, what is the name of the agency? N/a  ?Has the agency set up a time to come to the patient's home? no ?Were any new equipment or medical supplies ordered?  No ?What is the name of the medical supply agency? N/a ?Were you able to get the supplies/equipment? no ?Do you have any questions related to the use of the equipment or supplies? No ? ?Functional Questionnaire: (I = Independent and D = Dependent) ?ADLs: d ? ?Bathing/Dressing- d ? ?Meal Prep- d ? ?Eating- d ? ?Maintaining continence- d ? ?Transferring/Ambulation- d ? ?Managing Meds- d ? ?Follow up appointments reviewed: ? ?PCP Hospital f/u appt confirmed? No  Scheduled to see n/a on n/a @ n/a. ?Specialist Hospital f/u appt confirmed? No  Scheduled to see n/a on n/a @ n/a. ?Are transportation arrangements needed? No  ?If their condition worsens, is the pt aware to call PCP or go to the Emergency Dept.? Yes ?Was the patient provided with contact information for the PCP's office or ED? Yes ?Was to pt encouraged to call back with questions or concerns? Yes  ?

## 2021-05-18 ENCOUNTER — Ambulatory Visit: Payer: Medicare HMO | Admitting: Nurse Practitioner

## 2021-05-25 ENCOUNTER — Encounter: Payer: Self-pay | Admitting: Nurse Practitioner

## 2021-05-25 ENCOUNTER — Other Ambulatory Visit: Payer: Self-pay

## 2021-05-25 ENCOUNTER — Ambulatory Visit (INDEPENDENT_AMBULATORY_CARE_PROVIDER_SITE_OTHER): Payer: Medicare HMO | Admitting: Nurse Practitioner

## 2021-05-25 VITALS — BP 128/70 | HR 60 | Temp 97.5°F | Ht 63.0 in | Wt 119.0 lb

## 2021-05-25 DIAGNOSIS — I10 Essential (primary) hypertension: Secondary | ICD-10-CM | POA: Diagnosis not present

## 2021-05-25 DIAGNOSIS — R4182 Altered mental status, unspecified: Secondary | ICD-10-CM

## 2021-05-25 DIAGNOSIS — R82998 Other abnormal findings in urine: Secondary | ICD-10-CM

## 2021-05-25 DIAGNOSIS — Z4802 Encounter for removal of sutures: Secondary | ICD-10-CM | POA: Diagnosis not present

## 2021-05-25 DIAGNOSIS — E039 Hypothyroidism, unspecified: Secondary | ICD-10-CM

## 2021-05-25 DIAGNOSIS — W19XXXD Unspecified fall, subsequent encounter: Secondary | ICD-10-CM

## 2021-05-25 DIAGNOSIS — Z113 Encounter for screening for infections with a predominantly sexual mode of transmission: Secondary | ICD-10-CM | POA: Diagnosis not present

## 2021-05-25 DIAGNOSIS — N390 Urinary tract infection, site not specified: Secondary | ICD-10-CM | POA: Diagnosis not present

## 2021-05-25 LAB — POCT URINALYSIS DIPSTICK
Bilirubin, UA: NEGATIVE
Glucose, UA: NEGATIVE
Nitrite, UA: POSITIVE
Protein, UA: POSITIVE — AB
Spec Grav, UA: 1.025 (ref 1.010–1.025)
Urobilinogen, UA: 0.2 E.U./dL
pH, UA: 5.5 (ref 5.0–8.0)

## 2021-05-25 NOTE — Patient Instructions (Signed)
Understanding Your Risk for Falls Each year, millions of people have serious injuries from falls. It is important to understand your risk for falling. Talk with your health care provider about your risk and what you can do to lower it. There are actions you can take athome to lower your risk and prevent falls. If you do have a serious fall, make sure to tell your health care provider.Falling once raises your risk of falling again. How can falls affect me? Serious injuries from falls are common. These include: Broken bones, such as hip fractures. Head injuries, such as traumatic brain injuries (TBI) or concussion. A fear of falling can cause you to avoid activities and stay at home. This canmake your muscles weaker and actually raise your risk for a fall. What can increase my risk? There are a number of risk factors that increase your risk for falling. The more risk factors you have, the higher your risk of falling. Serious injuries from a fall happen most often to people older than age 65. Children and youngadults ages 15-29 are also at higher risk. Common risk factors include: Weakness in the lower body. Lack (deficiency) of vitamin D. Being generally weak or confused due to long-term (chronic) illness. Dizziness or balance problems. Poor vision. Medicines that cause dizziness or drowsiness. These can include medicines for your blood pressure, heart, anxiety, insomnia, or edema, as well as pain medicines and muscle relaxants. Other risk factors include: Drinking alcohol. Having had a fall in the past. Having depression. Having foot pain or wearing improper footwear. Working at a dangerous job. Having any of the following in your home: Tripping hazards, such as floor clutter or loose rugs. Poor lighting. Pets. Having dementia or memory loss. What actions can I take to lower my risk of falling?     Physical activity Maintain physical fitness. Do strength and balance exercises.  Consider taking aregular class to build strength and balance. Yoga and tai chi are good options. Vision Have your eyes checked every year and your vision prescription updated asneeded. Walking aids and footwear Wear nonskid shoes. Do not wear high heels. Do not walk around the house in socks or slippers. Use a cane or walker as told by your health care provider. Home safety Attach secure railings on both sides of your stairs. Install grab bars for your tub, shower, and toilet. Use a bath mat in your tub or shower. Use good lighting in all rooms. Keep a flashlight near your bed. Make sure there is a clear path from your bed to the bathroom. Use night-lights. Do not use throw rugs. Make sure all carpeting is taped or tacked down securely. Remove all clutter from walkways and stairways, including extension cords. Repair uneven or broken steps. Avoid walking on icy or slippery surfaces. Walk on the grass instead of on icy or slick sidewalks. Use ice melt to get rid of ice on walkways. Use a cordless phone. Questions to ask your health care provider Can you help me check my risk for a fall? Do any of my medicines make me more likely to fall? Should I take a vitamin D supplement? What exercises can I do to improve my strength and balance? Should I make an appointment to have my vision checked? Do I need a bone density test to check for weak bones or osteoporosis? Would it help to use a cane or a walker? Where to find more information Centers for Disease Control and Prevention, [employer]: www.cdc.gov Community-Based Fall Prevention Programs:   www.cdc.gov National Institute on Aging: www.nia.nih.gov Contact a health care provider if: You fall at home. You are afraid of falling at home. You feel weak, drowsy, or dizzy. Summary Serious injuries from a fall happen most often to people older than age 65. Children and young adults ages 15-29 are also at higher risk. Talk with your health care  provider about your risks for falling and how to lower those risks. Taking certain precautions at home can lower your risk for falling. If you fall, always tell your health care provider. This information is not intended to replace advice given to you by your health care provider. Make sure you discuss any questions you have with your healthcare provider. Document Revised: 09/17/2019 Document Reviewed: 09/17/2019 Elsevier Patient Education  2022 [employer]  

## 2021-05-25 NOTE — Progress Notes (Signed)
?Clinical biochemist as a Neurosurgeon for [employer], FNP.,have documented all relevant documentation on the behalf of Arnette Felts, FNP,as directed by  Arnette Felts, FNP while in the presence of Arnette Felts, FNP. ? ?This visit occurred during the SARS-CoV-2 public health emergency.  Safety protocols were in place, including screening questions prior to the visit, additional usage of staff PPE, and extensive cleaning of exam room while observing appropriate contact time as indicated for disinfecting solutions. ? ?Subjective:  ?  ? Patient ID: Amy Whitehead , female    DOB: 07/11/1933 , 88 y.o.   MRN: [id number] ? ? ?Chief Complaint  ?Patient presents with  ? Follow-up  ? ? ?HPI ? ?She is here for follow up after having a fall. She had sutures placed to the back of her head. She is here today by herself in the room the staff from the assisted living facility is in the waiting room.  ?  ? ?Past Medical History:  ?Diagnosis Date  ? Arthritis   ? Cataract   ? Post herpetic neuralgia 04/17/2011  ?  ? ?Family History  ?Problem Relation Age of Onset  ? Cancer Father   ?     brain tumor  ? Cancer Sister   ?     lung  ? Diabetes Sister   ? ? ? ?Current Outpatient Medications:  ?  acetaminophen (TYLENOL) 500 MG tablet, Take 500 mg by mouth every 6 (six) hours as needed., Disp: , Rfl:  ?  amLODipine (NORVASC) 5 MG tablet, Take 1 tablet (5 mg total) by mouth daily., Disp: 90 tablet, Rfl: 1 ?  aspirin EC 81 MG tablet, Take 81 mg by mouth daily., Disp: , Rfl:  ?  Calcium-Vitamin D (CALTRATE 600 PLUS-VIT D PO), Take 2 tablets by mouth 2 (two) times daily., Disp: , Rfl:  ?  gabapentin (NEURONTIN) 300 MG capsule, TAKE 3 CAPSULES 3 TIMES DAILY, Disp: 270 capsule, Rfl: 3 ?  levothyroxine (SYNTHROID) 100 MCG tablet, Take 1 tablet (100 mcg total) by mouth daily before breakfast., Disp: 30 tablet, Rfl: 5 ?  Multiple Vitamin (MULTIVITAMIN WITH MINERALS) TABS tablet, Take 1 tablet by mouth daily., Disp: , Rfl:  ?  polyethylene glycol powder  (MIRALAX) 17 GM/SCOOP powder, Take 17 g by mouth 2 (two) times daily as needed for moderate constipation., Disp: 255 g, Rfl: 0 ?  QUEtiapine (SEROQUEL) 25 MG tablet, TAKE ONE TABLET AT BEDTIME, Disp: 90 tablet, Rfl: 1 ?  sulfamethoxazole-trimethoprim (BACTRIM) 400-80 MG tablet, Take 1 tablet by mouth 2 (two) times daily for 10 days., Disp: 20 tablet, Rfl: 0 ? ?Current Facility-Administered Medications:  ?  cefTRIAXone (ROCEPHIN) injection 1 g, 1 g, Intramuscular, Once, Ghumman, Ramandeep, NP  ? ?No Known Allergies  ? ?Review of Systems  ?Constitutional: Negative.   ?Respiratory: Negative.    ?Cardiovascular: Negative.   ?Psychiatric/Behavioral: Negative.     ? ?Today's Vitals  ? 05/25/21 1433  ?BP: 128/70  ?Pulse: 60  ?Temp: (!) 97.5 ?F (36.4 ?C)  ?TempSrc: Oral  ?Weight: 119 lb (54 kg)  ?Height: 5\' 3"  (1.6 m)  ? ?Body mass index is 21.08 kg/m?.  ?Wt Readings from Last 3 Encounters:  ?05/25/21 119 lb (54 kg)  ?05/04/21 130 lb (59 kg)  ?03/17/21 120 lb 6.4 oz (54.6 kg)  ? ? ?Objective:  ?Physical Exam ?Vitals reviewed.  ?Constitutional:   ?   General: She is not in acute distress. ?   Appearance: Normal appearance.  ?Cardiovascular:  ?  Rate and Rhythm: Normal rate and regular rhythm.  ?   Pulses: Normal pulses.  ?   Heart sounds: Normal heart sounds. No murmur heard. ?Pulmonary:  ?   Effort: Pulmonary effort is normal. No respiratory distress.  ?   Breath sounds: Normal breath sounds. No wheezing.  ?Skin: ?   Comments: Removed 2 staples from left side of her head, cleansed area with peroxide to remove scabbing.   ?Neurological:  ?   General: No focal deficit present.  ?   Mental Status: She is alert and oriented to person, place, and time.  ?   Cranial Nerves: No cranial nerve deficit.  ?   Motor: No weakness.  ?Psychiatric:     ?   Mood and Affect: Mood normal.     ?   Behavior: Behavior normal.     ?   Thought Content: Thought content normal.     ?   Judgment: Judgment normal.  ?  ? ?   ?Assessment And Plan:  ?    ?1. Fall, subsequent encounter ?Comments: She had a fall at ther assisted living facility went to ER and had 2 staples placed to posterior head. ?- Ambulatory referral to Home Health ? ?2. Acquired hypothyroidism ?Comments: Will recheck thyroid levels and make changes to medications pending results.  ?- TSH ? ?3. Essential hypertension ?Comments: Blood pressure is well controlled, continue current medications ? ?4. Altered mental status, unspecified altered mental status type ?Comments: Will check additional tests for her memory due to at times during visit seemed her memory had declined. ?- RPR ?- Vitamin B12 ? ?5. Frequent urinary tract infections ?Comments: positive nitrates will treat for urinary tract infection and send for urine culture ?- POCT Urinalysis Dipstick (03500) ? ?6. Encounter for staple removal ?Comments: Removed 2 staples from left side of head.  ? ?7. Leukocytes in urine ?- Culture, Urine ?  ? ? ?Patient was given opportunity to ask questions. Patient verbalized understanding of the plan and was able to repeat key elements of the plan. All questions were answered to their satisfaction.  ?Arnette Felts, FNP  ? ?I, Arnette Felts, FNP, have reviewed all documentation for this visit. The documentation on 05/25/21 for the exam, diagnosis, procedures, and orders are all accurate and complete.  ? ?IF YOU HAVE BEEN REFERRED TO A SPECIALIST, IT MAY TAKE 1-2 WEEKS TO SCHEDULE/PROCESS THE REFERRAL. IF YOU HAVE NOT HEARD FROM US/SPECIALIST IN TWO WEEKS, PLEASE GIVE [place] A CALL AT [phone].  ? ?THE PATIENT IS ENCOURAGED TO PRACTICE SOCIAL DISTANCING DUE TO THE COVID-19 PANDEMIC.   ?

## 2021-05-26 LAB — RPR: RPR Ser Ql: NONREACTIVE

## 2021-05-26 LAB — VITAMIN B12: Vitamin B-12: 1479 pg/mL — ABNORMAL HIGH (ref 232–1245)

## 2021-05-26 LAB — TSH: TSH: 6.26 u[IU]/mL — ABNORMAL HIGH (ref 0.450–4.500)

## 2021-05-27 ENCOUNTER — Other Ambulatory Visit: Payer: Self-pay | Admitting: Nurse Practitioner

## 2021-05-27 DIAGNOSIS — E039 Hypothyroidism, unspecified: Secondary | ICD-10-CM

## 2021-05-27 DIAGNOSIS — N390 Urinary tract infection, site not specified: Secondary | ICD-10-CM

## 2021-05-27 MED ORDER — SULFAMETHOXAZOLE-TRIMETHOPRIM 400-80 MG PO TABS: 1.0000 | ORAL_TABLET | Freq: Two times a day (BID) | ORAL | 0 refills | Status: AC

## 2021-05-27 MED ORDER — LEVOTHYROXINE SODIUM 100 MCG PO TABS: 100.0000 ug | ORAL_TABLET | Freq: Every day | ORAL | 5 refills | Status: AC

## 2021-05-28 LAB — URINE CULTURE

## 2021-06-06 ENCOUNTER — Other Ambulatory Visit: Payer: Self-pay | Admitting: Nurse Practitioner

## 2021-06-06 DIAGNOSIS — N390 Urinary tract infection, site not specified: Secondary | ICD-10-CM

## 2021-06-15 ENCOUNTER — Other Ambulatory Visit: Payer: Self-pay

## 2021-06-15 MED ORDER — GABAPENTIN 300 MG PO CAPS: ORAL_CAPSULE | ORAL | 3 refills | Status: AC

## 2021-06-17 ENCOUNTER — Ambulatory Visit (HOSPITAL_BASED_OUTPATIENT_CLINIC_OR_DEPARTMENT_OTHER): Payer: Medicare HMO | Admitting: Cardiology

## 2021-06-17 ENCOUNTER — Encounter (HOSPITAL_BASED_OUTPATIENT_CLINIC_OR_DEPARTMENT_OTHER): Payer: Self-pay | Admitting: Cardiology

## 2021-06-17 VITALS — BP 124/60 | HR 61 | Ht 66.0 in | Wt 126.9 lb

## 2021-06-17 DIAGNOSIS — Z7189 Other specified counseling: Secondary | ICD-10-CM | POA: Diagnosis not present

## 2021-06-17 DIAGNOSIS — I10 Essential (primary) hypertension: Secondary | ICD-10-CM | POA: Diagnosis not present

## 2021-06-17 DIAGNOSIS — R011 Cardiac murmur, unspecified: Secondary | ICD-10-CM

## 2021-06-17 NOTE — Progress Notes (Signed)
?Cardiology Office Note:   ? ?Date:  06/17/2021  ? ?ID:  Amy Whitehead, DOB [birth year], MRN [id number] ? ?PCP:  Arnette Felts, FNP  ?Cardiologist:  Jodelle Red, MD ? ?Referring MD: Arnette Felts, FNP  ? ?CC: new patient evaluation for murmur ? ?History of Present Illness:   ? ?Amy Whitehead is a 88 y.o. female with a hx of hypertension, dementia who is seen as a new consult at the request of Reathel, Turi, FNP for the evaluation and management of murmur. ? ?Note from 03/17/21 with Arnette Felts, FNP reviewed. Main focus of visit was to follow up on thyroid issues, but murmur noted on exam. Referred to cardiology for further evaluation. ? ?Today: ?Here with her daughter today. They report that she has been told she has a murmur on two occasions. Has had issues with recurrent falls since 2021. Not thought to be syncope, though to be mechanical.  ? ?No known history of ASCVD. No history of heart failure. No significant family history of premature CAD. ? ?Denies chest pain, shortness of breath at rest or with normal exertion. No PND, orthopnea, LE edema or unexpected weight gain. No syncope or palpitations.  ? ?Past Medical History:  ?Diagnosis Date  ? Arthritis   ? Cataract   ? Post herpetic neuralgia 04/17/2011  ? ? ?Past Surgical History:  ?Procedure Laterality Date  ? ABDOMINAL HYSTERECTOMY  1980  ? For tumor on ovary - likely benign as she had no further treatment.  Unsure if uterus was removed, but menses stopped after surgery  ? ? ?Current Medications: ?Current Outpatient Medications on File Prior to Visit  ?Medication Sig  ? acetaminophen (TYLENOL) 500 MG tablet Take 500 mg by mouth every 6 (six) hours as needed.  ? amLODipine (NORVASC) 5 MG tablet Take 1 tablet (5 mg total) by mouth daily.  ? aspirin EC 81 MG tablet Take 81 mg by mouth daily.  ? Calcium-Vitamin D (CALTRATE 600 PLUS-VIT D PO) Take 2 tablets by mouth 2 (two) times daily.  ? gabapentin (NEURONTIN) 300 MG capsule TAKE 3 CAPSULES 3 TIMES DAILY  ?  levothyroxine (SYNTHROID) 100 MCG tablet Take 1 tablet (100 mcg total) by mouth daily before breakfast.  ? Multiple Vitamin (MULTIVITAMIN WITH MINERALS) TABS tablet Take 1 tablet by mouth daily.  ? polyethylene glycol powder (MIRALAX) 17 GM/SCOOP powder Take 17 g by mouth 2 (two) times daily as needed for moderate constipation.  ? QUEtiapine (SEROQUEL) 25 MG tablet TAKE ONE TABLET AT BEDTIME  ? ?Current Facility-Administered Medications on File Prior to Visit  ?Medication  ? cefTRIAXone (ROCEPHIN) injection 1 g  ?  ? ?Allergies:   Patient has no known allergies.  ? ?Social History  ? ?Tobacco Use  ? Smoking status: Former  ?  Types: Cigarettes  ?  Quit date: 04/16/1962  ?  Years since quitting: 59.2  ? Smokeless tobacco: Never  ? Tobacco comments:  ?  No plans to start again  ?Vaping Use  ? Vaping Use: Never used  ?Substance Use Topics  ? Alcohol use: No  ? Drug use: No  ? ? ?Family History: ?family history includes Cancer in her father and sister; Diabetes in her sister. ? ?ROS:   ?Please see the history of present illness.  Additional pertinent ROS: ?Constitutional: Negative for chills, fever, night sweats, unintentional weight loss  ?HENT: Negative for ear pain and hearing loss.   ?Eyes: Negative for loss of vision and eye pain.  ?Respiratory: Negative for cough,  sputum, wheezing.   ?Cardiovascular: See HPI. ?Gastrointestinal: Negative for abdominal pain, melena, and hematochezia.  ?Genitourinary: Negative for dysuria and hematuria.  ?Musculoskeletal: Negative for falls and myalgias.  ?Skin: Negative for itching and rash.  ?Neurological: Negative for focal weakness, focal sensory changes and loss of consciousness.  ?Endo/Heme/Allergies: Does not bruise/bleed easily.   ? ? ?EKGs/Labs/Other Studies Reviewed:   ? ?The following studies were reviewed today: ?Echo 08/13/20 ? 1. Left ventricular ejection fraction, by estimation, is 60 to 65%. The  ?left ventricle has normal function. The left ventricle has no regional   ?wall motion abnormalities. There is mild left ventricular hypertrophy of  ?the basal-septal segment. Left  ?ventricular diastolic parameters are consistent with Grade I diastolic  ?dysfunction (impaired relaxation). Elevated left atrial pressure.  ? 2. Right ventricular systolic function is normal. The right ventricular  ?size is normal.  ? 3. The mitral valve is normal in structure. No evidence of mitral valve  ?regurgitation. No evidence of mitral stenosis.  ? 4. The aortic valve is calcified. Aortic valve regurgitation is not  ?visualized. Mild aortic valve stenosis.  ? 5. The inferior vena cava is normal in size with greater than 50%  ?respiratory variability, suggesting right atrial pressure of 3 mmHg. ? ?EKG:  EKG is personally reviewed.   ?06/17/21: NSR at 61 bpm, nonspecific ST-T pattern ? ?Recent Labs: ?05/04/2021: ALT 18; BUN 17; Creatinine, Ser 1.08; Hemoglobin 12.9; Platelets 214; Potassium 3.8; Sodium 142 ?05/25/2021: TSH 6.260  ?Recent Lipid Panel ?   ?Component Value Date/Time  ? CHOL 184 05/14/2017 0945  ? TRIG 69 05/14/2017 0945  ? HDL 72 05/14/2017 0945  ? CHOLHDL 2.6 05/14/2017 0945  ? CHOLHDL 3.8 02/02/2012 1033  ? VLDL 27 02/02/2012 1033  ? LDLCALC 98 05/14/2017 0945  ? ? ?Physical Exam:   ? ?VS:  BP 124/60   Pulse 61   Ht 5\' 6"  (1.676 m)   Wt 126 lb 14.4 oz (57.6 kg)   BMI 20.48 kg/m?    ? ?Wt Readings from Last 3 Encounters:  ?06/17/21 126 lb 14.4 oz (57.6 kg)  ?05/25/21 119 lb (54 kg)  ?05/04/21 130 lb (59 kg)  ?  ?GEN: Well nourished, well developed in no acute distress ?HEENT: Normal, moist mucous membranes ?NECK: No JVD ?CARDIAC: regular rhythm, normal S1 and S2, no rubs or gallops. 2/6 systolic ejection murmur. ?VASCULAR: Radial and DP pulses 2+ bilaterally. No carotid bruits ?RESPIRATORY:  Clear to auscultation without rales, wheezing or rhonchi  ?ABDOMEN: Soft, non-tender, non-distended ?MUSCULOSKELETAL:  Ambulates independently ?SKIN: Warm and dry, no edema ?NEUROLOGIC:  Alert and  oriented x 3. No focal neuro deficits noted. ?PSYCHIATRIC:  Normal affect   ? ?ASSESSMENT:   ? ?1. Murmur   ?2. Essential hypertension   ?3. Cardiac risk counseling   ?4. Counseling on health promotion and disease prevention   ? ?PLAN:   ? ?Murmur: consistent with aortic sclerosis noted on echo 08/13/20. No high risk features. No further indication for additional evaluation ? ?Hypertension: controlled on amlodipine ? ?Aspirin use: no clear indication from what I can tell. Discussed guideline recommendations for this, consider stopping ? ?Cardiac risk counseling and prevention recommendations: ?-recommend heart healthy/Mediterranean diet, with whole grains, fruits, vegetable, fish, lean meats, nuts, and olive oil. Limit salt. ?-recommend moderate walking, 3-5 times/week for 30-50 minutes each session. Aim for at least 150 minutes.week. Goal should be pace of 3 miles/hours, or walking 1.5 miles in 30 minutes ?-recommend avoidance of tobacco products. Avoid  excess alcohol. ? ?Plan for follow up: as needed ? ?Jodelle RedBridgette Marli Diego, MD, PhD, [facility] HospitalFACC ?[place]  [facility]   ? ?Medication Adjustments/Labs and Tests Ordered: ?Current medicines are reviewed at length with the patient today.  Concerns regarding medicines are outlined above.  ?Orders Placed This Encounter  ?Procedures  ? EKG 12-Lead  ? ?No orders of the defined types were placed in this encounter. ? ? ?Patient Instructions  ?Medication Instructions:  ?Your Physician recommend you continue on your current medication as directed.   ? ?*If you need a refill on your cardiac medications before your next appointment, please call your pharmacy* ? ? ?Lab Work: ?None ordered today ? ? ?Testing/Procedures: ?None ordered today ? ? ?Follow-Up: ?[facility], you and your health needs are our priority.  As part of our continuing mission to provide you with exceptional heart care, we have created designated Provider Care Teams.  These Care Teams include your  primary Cardiologist (physician) and Advanced Practice Providers (APPs -  Physician Assistants and Nurse Practitioners) who all work together to provide you with the care you need, when you need it. ? ?We recommend

## 2021-06-17 NOTE — Patient Instructions (Signed)
Medication Instructions:  ?Your Physician recommend you continue on your current medication as directed.   ? ?*If you need a refill on your cardiac medications before your next appointment, please call your pharmacy* ? ? ?Lab Work: ?None ordered today ? ? ?Testing/Procedures: ?None ordered today ? ? ?Follow-Up: ?At [facility], you and your health needs are our priority.  As part of our continuing mission to provide you with exceptional heart care, we have created designated Provider Care Teams.  These Care Teams include your primary Cardiologist (physician) and Advanced Practice Providers (APPs -  Physician Assistants and Nurse Practitioners) who all work together to provide you with the care you need, when you need it. ? ?We recommend signing up for the patient portal called "MyChart".  Sign up information is provided on this After Visit Summary.  MyChart is used to connect with patients for Virtual Visits (Telemedicine).  Patients are able to view lab/test results, encounter notes, upcoming appointments, etc.  Non-urgent messages can be sent to your provider as well.   ?To learn more about what you can do with MyChart, go to [email or web address].   ? ?Your next appointment:   ?As needed ? ?The format for your next appointment:   ?In Person ? ?Provider:   ?Bridgette Christopher, MD{ ? ?Important Information About [place] ? ? ? ? ? ? ?

## 2021-06-21 ENCOUNTER — Other Ambulatory Visit: Payer: Self-pay | Admitting: Nurse Practitioner

## 2021-07-05 ENCOUNTER — Other Ambulatory Visit: Payer: Self-pay | Admitting: Nurse Practitioner

## 2021-07-05 MED ORDER — NITROFURANTOIN MONOHYD MACRO 100 MG PO CAPS: 100.0000 mg | ORAL_CAPSULE | Freq: Two times a day (BID) | ORAL | 0 refills | Status: AC

## 2021-07-06 ENCOUNTER — Other Ambulatory Visit: Payer: Medicare HMO

## 2021-07-07 ENCOUNTER — Other Ambulatory Visit: Payer: Medicare HMO

## 2021-07-18 ENCOUNTER — Telehealth: Payer: Self-pay

## 2021-07-18 ENCOUNTER — Ambulatory Visit: Payer: Medicare HMO | Admitting: Nurse Practitioner

## 2021-07-18 NOTE — Telephone Encounter (Signed)
Referral placed for Remote health.

## 2021-07-21 ENCOUNTER — Ambulatory Visit: Payer: Medicare HMO | Admitting: Nurse Practitioner

## 2021-08-04 ENCOUNTER — Other Ambulatory Visit (INDEPENDENT_AMBULATORY_CARE_PROVIDER_SITE_OTHER): Payer: Medicare HMO | Admitting: Nurse Practitioner

## 2021-08-04 DIAGNOSIS — I8390 Asymptomatic varicose veins of unspecified lower extremity: Secondary | ICD-10-CM | POA: Diagnosis not present

## 2021-08-04 DIAGNOSIS — F02811 Dementia in other diseases classified elsewhere, unspecified severity, with agitation: Secondary | ICD-10-CM

## 2021-08-04 DIAGNOSIS — E039 Hypothyroidism, unspecified: Secondary | ICD-10-CM

## 2021-08-04 DIAGNOSIS — H269 Unspecified cataract: Secondary | ICD-10-CM | POA: Diagnosis not present

## 2021-08-04 DIAGNOSIS — M1990 Unspecified osteoarthritis, unspecified site: Secondary | ICD-10-CM | POA: Diagnosis not present

## 2021-08-04 DIAGNOSIS — M722 Plantar fascial fibromatosis: Secondary | ICD-10-CM

## 2021-08-04 DIAGNOSIS — I10 Essential (primary) hypertension: Secondary | ICD-10-CM | POA: Diagnosis not present

## 2021-08-04 DIAGNOSIS — G309 Alzheimer's disease, unspecified: Secondary | ICD-10-CM

## 2021-08-04 NOTE — Progress Notes (Signed)
  Chief Complaint  Patient presents with   Initial Home Health Certification   Received home health orders orders from [facility] 06/12/2021. Certification and orders from 06/12/2021 through 08/10/2021 are reviewed, signed and faxed back to home health company.  Need of intermittent skilled services at home: PT  The home health care plan has been established by me and will be reviewed and updated as needed to maximize patient recovery.  I certify that all home health services have been and will be furnished to the patient while under my care.  Face-to-face encounter in which the need for home health services was established: 05/25/2021  Patient is receiving home health services for the following diagnoses: Problem List Items Addressed This Visit       Nervous and Auditory   Alzheimer's disease, unspecified (CODE) ([place]) - Primary   Other Visit Diagnoses     Dementia associated with other underlying disease, with agitation, unspecified dementia severity ([place])       Essential hypertension       Osteoarthritis, unspecified osteoarthritis type, unspecified site       Plantar fascial fibromatosis       Asymptomatic varicose veins       Acquired hypothyroidism       Cataract, unspecified cataract type, unspecified laterality            Minette Brine, FNP

## 2021-08-25 ENCOUNTER — Ambulatory Visit: Payer: Medicare HMO | Admitting: Nurse Practitioner

## 2021-09-14 DIAGNOSIS — I10 Essential (primary) hypertension: Secondary | ICD-10-CM | POA: Diagnosis not present

## 2021-09-14 DIAGNOSIS — G629 Polyneuropathy, unspecified: Secondary | ICD-10-CM | POA: Diagnosis not present

## 2021-10-06 ENCOUNTER — Ambulatory Visit: Payer: Medicare HMO

## 2021-10-06 ENCOUNTER — Ambulatory Visit: Payer: Medicare HMO | Admitting: Nurse Practitioner

## 2021-10-20 ENCOUNTER — Ambulatory Visit: Payer: Medicare HMO

## 2021-10-27 DIAGNOSIS — G301 Alzheimer's disease with late onset: Secondary | ICD-10-CM | POA: Diagnosis not present

## 2021-10-27 DIAGNOSIS — G629 Polyneuropathy, unspecified: Secondary | ICD-10-CM | POA: Diagnosis not present

## 2021-10-28 DIAGNOSIS — G301 Alzheimer's disease with late onset: Secondary | ICD-10-CM | POA: Diagnosis not present

## 2021-10-28 DIAGNOSIS — F02B3 Dementia in other diseases classified elsewhere, moderate, with mood disturbance: Secondary | ICD-10-CM | POA: Diagnosis not present

## 2021-11-21 DIAGNOSIS — M2041 Other hammer toe(s) (acquired), right foot: Secondary | ICD-10-CM | POA: Diagnosis not present

## 2021-11-21 DIAGNOSIS — B351 Tinea unguium: Secondary | ICD-10-CM | POA: Diagnosis not present

## 2021-11-25 DIAGNOSIS — G301 Alzheimer's disease with late onset: Secondary | ICD-10-CM | POA: Diagnosis not present

## 2021-11-25 DIAGNOSIS — E038 Other specified hypothyroidism: Secondary | ICD-10-CM | POA: Diagnosis not present

## 2021-12-01 ENCOUNTER — Emergency Department (HOSPITAL_COMMUNITY): Payer: Medicare HMO

## 2021-12-01 ENCOUNTER — Emergency Department (HOSPITAL_COMMUNITY)
Admission: EM | Admit: 2021-12-01 | Discharge: 2021-12-01 | Disposition: A | Discharge status: Skilled Nursing Facility | Payer: Medicare HMO | Attending: Emergency Medicine | Admitting: Emergency Medicine

## 2021-12-01 ENCOUNTER — Encounter (HOSPITAL_COMMUNITY): Payer: Self-pay | Admitting: Emergency Medicine

## 2021-12-01 DIAGNOSIS — R9431 Abnormal electrocardiogram [ECG] [EKG]: Secondary | ICD-10-CM | POA: Diagnosis not present

## 2021-12-01 DIAGNOSIS — R58 Hemorrhage, not elsewhere classified: Secondary | ICD-10-CM | POA: Diagnosis not present

## 2021-12-01 DIAGNOSIS — Z79899 Other long term (current) drug therapy: Secondary | ICD-10-CM | POA: Insufficient documentation

## 2021-12-01 DIAGNOSIS — W19XXXA Unspecified fall, initial encounter: Secondary | ICD-10-CM

## 2021-12-01 DIAGNOSIS — W01198A Fall on same level from slipping, tripping and stumbling with subsequent striking against other object, initial encounter: Secondary | ICD-10-CM | POA: Diagnosis not present

## 2021-12-01 DIAGNOSIS — S0990XA Unspecified injury of head, initial encounter: Secondary | ICD-10-CM | POA: Diagnosis not present

## 2021-12-01 DIAGNOSIS — G4489 Other headache syndrome: Secondary | ICD-10-CM | POA: Diagnosis not present

## 2021-12-01 DIAGNOSIS — Z7982 Long term (current) use of aspirin: Secondary | ICD-10-CM | POA: Diagnosis not present

## 2021-12-01 DIAGNOSIS — M4322 Fusion of spine, cervical region: Secondary | ICD-10-CM | POA: Diagnosis not present

## 2021-12-01 DIAGNOSIS — S0101XA Laceration without foreign body of scalp, initial encounter: Secondary | ICD-10-CM | POA: Insufficient documentation

## 2021-12-01 DIAGNOSIS — F0390 Unspecified dementia without behavioral disturbance: Secondary | ICD-10-CM | POA: Insufficient documentation

## 2021-12-01 DIAGNOSIS — M25551 Pain in right hip: Secondary | ICD-10-CM | POA: Insufficient documentation

## 2021-12-01 DIAGNOSIS — G319 Degenerative disease of nervous system, unspecified: Secondary | ICD-10-CM | POA: Diagnosis not present

## 2021-12-01 DIAGNOSIS — M25451 Effusion, right hip: Secondary | ICD-10-CM | POA: Diagnosis not present

## 2021-12-01 DIAGNOSIS — S199XXA Unspecified injury of neck, initial encounter: Secondary | ICD-10-CM | POA: Diagnosis not present

## 2021-12-01 DIAGNOSIS — N3000 Acute cystitis without hematuria: Secondary | ICD-10-CM | POA: Insufficient documentation

## 2021-12-01 DIAGNOSIS — Z743 Need for continuous supervision: Secondary | ICD-10-CM | POA: Diagnosis not present

## 2021-12-01 DIAGNOSIS — I6782 Cerebral ischemia: Secondary | ICD-10-CM | POA: Diagnosis not present

## 2021-12-01 DIAGNOSIS — R519 Headache, unspecified: Secondary | ICD-10-CM | POA: Diagnosis not present

## 2021-12-01 DIAGNOSIS — M1611 Unilateral primary osteoarthritis, right hip: Secondary | ICD-10-CM | POA: Diagnosis not present

## 2021-12-01 DIAGNOSIS — M47812 Spondylosis without myelopathy or radiculopathy, cervical region: Secondary | ICD-10-CM | POA: Diagnosis not present

## 2021-12-01 LAB — URINALYSIS, ROUTINE W REFLEX MICROSCOPIC
Bilirubin Urine: NEGATIVE
Glucose, UA: NEGATIVE mg/dL
Hgb urine dipstick: NEGATIVE
Ketones, ur: NEGATIVE mg/dL
Nitrite: POSITIVE — AB
Protein, ur: NEGATIVE mg/dL
Specific Gravity, Urine: 1.011 (ref 1.005–1.030)
pH: 7 (ref 5.0–8.0)

## 2021-12-01 MED ORDER — CEPHALEXIN 500 MG PO CAPS: 500.0000 mg | ORAL_CAPSULE | Freq: Three times a day (TID) | ORAL | 0 refills | Status: AC

## 2021-12-01 MED ORDER — LIDOCAINE HCL 1 % IJ SOLN
INTRAMUSCULAR | Status: AC
  Filled 2021-12-01: qty 20

## 2021-12-01 MED ORDER — CEFTRIAXONE SODIUM 1 G IJ SOLR
1.0000 g | Freq: Once | INTRAMUSCULAR | Status: AC
  Administered 2021-12-01: 1 g via INTRAMUSCULAR
  Filled 2021-12-01: qty 10

## 2021-12-01 NOTE — ED Notes (Addendum)
Attempted to call facility to confirm pt is wheelchair bound and nonambulatory. Multiple attempts and facility did not answer.

## 2021-12-01 NOTE — ED Notes (Signed)
Pt soiled upon arrival. This Probation officer provided pericare and clean linen. Pt placed on purewick and clean brief.

## 2021-12-01 NOTE — ED Notes (Signed)
[facility] called for ride

## 2021-12-01 NOTE — ED Provider Notes (Addendum)
[facility]-EMERGENCY DEPT Provider Note   CSN: [id number] Arrival date & time: 12/01/21  0141     History  Chief Complaint  Patient presents with   Amy Whitehead is a 88 y.o. female.  HPI     This is an 88 year old female who presents after an unwitnessed fall.  She is in a memory care unit.  When asked what happened she states "I was holding a baby and fell back and hit the headboard."   She is oriented x2.  Falls were unwitnessed.  She is reporting headache and right hip pain.  Unclear whether she was ambulatory following the fall.  Level 5 caveat for dementia  Home Medications Prior to Admission medications   Medication Sig Start Date End Date Taking? Authorizing Provider  acetaminophen (TYLENOL) 500 MG tablet Take 500 mg by mouth every 6 (six) hours as needed.   Yes [provider]  amLODipine (NORVASC) 5 MG tablet Take 1 tablet (5 mg total) by mouth daily. 03/17/21  Yes Arnette Felts, FNP  aspirin EC 81 MG tablet Take 81 mg by mouth daily.   Yes [provider]  Calcium-Vitamin D (CALTRATE 600 PLUS-VIT D PO) Take 2 tablets by mouth 2 (two) times daily.   Yes [provider]  cephALEXin (KEFLEX) 500 MG capsule Take 1 capsule (500 mg total) by mouth 3 (three) times daily. 12/01/21  Yes Kielyn Kardell, Mayer Masker, MD  gabapentin (NEURONTIN) 300 MG capsule TAKE 3 CAPSULES 3 TIMES DAILY Patient taking differently: Take 900 mg by mouth 3 (three) times daily. 06/15/21  Yes Arnette Felts, FNP  levothyroxine (SYNTHROID) 100 MCG tablet Take 1 tablet (100 mcg total) by mouth daily before breakfast. 05/27/21  Yes Arnette Felts, FNP  Multiple Vitamin (MULTIVITAMIN WITH MINERALS) TABS tablet Take 1 tablet by mouth daily.   Yes [provider]  polyethylene glycol powder (MIRALAX) 17 GM/SCOOP powder Take 17 g by mouth 2 (two) times daily as needed for moderate constipation. 01/20/20  Yes Almon Hercules, MD  QUEtiapine (SEROQUEL) 25 MG  tablet TAKE ONE TABLET AT BEDTIME Patient taking differently: Take 25 mg by mouth at bedtime. 06/21/21  Yes Arnette Felts, FNP      Allergies    Patient has no known allergies.    Review of Systems   Review of Systems  Unable to perform ROS: Dementia    Physical Exam Updated Vital Signs BP (!) 169/73   Pulse 61   Temp 97.8 F (36.6 C) (Oral)   Resp 18   SpO2 93%  Physical Exam Vitals and nursing note reviewed.  Constitutional:      Appearance: She is well-developed. She is not ill-appearing.  HENT:     Head: Normocephalic.     Comments: Hematoma occiput,  3 cm nongaping laceration noted, no active bleeding    Mouth/Throat:     Mouth: Mucous membranes are moist.  Eyes:     Pupils: Pupils are equal, round, and reactive to light.  Cardiovascular:     Rate and Rhythm: Normal rate and regular rhythm.     Heart sounds: Normal heart sounds.  Pulmonary:     Effort: Pulmonary effort is normal. No respiratory distress.     Breath sounds: No wheezing.  Abdominal:     Palpations: Abdomen is soft.  Musculoskeletal:     Cervical back: Neck supple.     Comments: Pain with range of motion of the right hip, no foreshortening noted, neurovascular  intact distally  Skin:    General: Skin is warm and dry.  Neurological:     Mental Status: She is alert.     Comments: Oriented to person and place but not time  Psychiatric:        Mood and Affect: Mood normal.     ED Results / Procedures / Treatments   Labs (all labs ordered are listed, but only abnormal results are displayed) Labs Reviewed  URINALYSIS, ROUTINE W REFLEX MICROSCOPIC - Abnormal; Notable for the following components:      Result Value   APPearance HAZY (*)    Nitrite POSITIVE (*)    Leukocytes,Ua LARGE (*)    Bacteria, UA MANY (*)    All other components within normal limits  URINE CULTURE    EKG EKG Interpretation  Date/Time:  Thursday December 01 2021 02:11:50 EDT Ventricular Rate:  64 PR  Interval:  165 QRS Duration: 82 QT Interval:  334 QTC Calculation: 345 R Axis:   46 Text Interpretation: Sinus rhythm Probable left atrial enlargement Nonspecific repol abnormality, diffuse leads Confirmed by Ross MarcusHorton, Jazalyn Mondor ([id number]) on 12/01/2021 3:24:39 AM  Radiology CT HIP RIGHT WO CONTRAST  Result Date: 12/01/2021 CLINICAL DATA:  Hip pain with stress fracture suspected. EXAM: CT OF THE RIGHT HIP WITHOUT CONTRAST TECHNIQUE: Multidetector CT imaging of the right hip was performed according to the standard protocol. Multiplanar CT image reconstructions were also generated. RADIATION DOSE REDUCTION: This exam was performed according to the departmental dose-optimization program which includes automated exposure control, adjustment of the mA and/or kV according to patient size and/or use of iterative reconstruction technique. COMPARISON:  None Available. FINDINGS: Bones/Joint/Cartilage Right hip osteoarthritis with severe joint degeneration. There is superior joint collapse, bulky spurring, and subchondral cysts. No superimposed fracture. Hip joint effusion is present Ligaments Suboptimally assessed by CT. Muscles and Tendons No evidence of major musculotendinous injury. Soft tissues Soft tissue stranding lateral to the hip could be pressure related or related to an injury. More pressure related findings in the right gluteal sub cutaneous fat Groin hernias, larger and bowel containing on the left. IMPRESSION: 1. No acute finding. 2. Severe right hip osteoarthritis. 3. Bowel containing left groin hernia. Electronically Signed   By: Tiburcio PeaJonathan  Watts M.D.   On: 12/01/2021 04:23   DG Hip Unilat W or Wo Pelvis 2-3 Views Right  Result Date: 12/01/2021 CLINICAL DATA:  Recent fall with hip pain EXAM: DG HIP (WITH OR WITHOUT PELVIS) 3V RIGHT COMPARISON:  None Available. FINDINGS: Pelvic ring is intact. Degenerative changes of the hip joints are noted right considerably greater than left. No acute fracture or  dislocation is noted IMPRESSION: Considerable degenerative changes of the right hip joint are noted without acute bony abnormality. Electronically Signed   By: Alcide CleverMark  Lukens M.D.   On: 12/01/2021 03:09   CT Head Wo Contrast  Result Date: 12/01/2021 CLINICAL DATA:  Fall, head laceration EXAM: CT HEAD WITHOUT CONTRAST CT CERVICAL SPINE WITHOUT CONTRAST TECHNIQUE: Multidetector CT imaging of the head and cervical spine was performed following the standard protocol without intravenous contrast. Multiplanar CT image reconstructions of the cervical spine were also generated. RADIATION DOSE REDUCTION: This exam was performed according to the departmental dose-optimization program which includes automated exposure control, adjustment of the mA and/or kV according to patient size and/or use of iterative reconstruction technique. COMPARISON:  05/04/2021 FINDINGS: CT HEAD FINDINGS Brain: No evidence of acute infarction, hemorrhage, hydrocephalus, extra-axial collection or mass lesion/mass effect. Mild cortical atrophy. Subcortical white matter  and periventricular small vessel ischemic changes. Vascular: Intracranial atherosclerosis. Skull: Normal. Negative for fracture or focal lesion. Sinuses/Orbits: The visualized paranasal sinuses are essentially clear. The mastoid air cells are unopacified. Other: None. CT CERVICAL SPINE FINDINGS Alignment: Straightening of the cervical spine, likely positional. Skull base and vertebrae: No acute fracture. No primary bone lesion or focal pathologic process. Incomplete fusion of the posterior arch of C1. Soft tissues and spinal canal: No prevertebral fluid or swelling. No visible canal hematoma. Disc levels: Mild degenerative changes at C6-7. Spinal canal is patent. Upper chest: Lung apices are clear. Other: Visualized thyroid is unremarkable. IMPRESSION: No evidence of acute intracranial abnormality. Atrophy with small vessel ischemic changes. No traumatic injury to the cervical spine.  Mild degenerative changes. Electronically Signed   By: Julian Hy M.D.   On: 12/01/2021 02:58   CT Cervical Spine Wo Contrast  Result Date: 12/01/2021 CLINICAL DATA:  Fall, head laceration EXAM: CT HEAD WITHOUT CONTRAST CT CERVICAL SPINE WITHOUT CONTRAST TECHNIQUE: Multidetector CT imaging of the head and cervical spine was performed following the standard protocol without intravenous contrast. Multiplanar CT image reconstructions of the cervical spine were also generated. RADIATION DOSE REDUCTION: This exam was performed according to the departmental dose-optimization program which includes automated exposure control, adjustment of the mA and/or kV according to patient size and/or use of iterative reconstruction technique. COMPARISON:  05/04/2021 FINDINGS: CT HEAD FINDINGS Brain: No evidence of acute infarction, hemorrhage, hydrocephalus, extra-axial collection or mass lesion/mass effect. Mild cortical atrophy. Subcortical white matter and periventricular small vessel ischemic changes. Vascular: Intracranial atherosclerosis. Skull: Normal. Negative for fracture or focal lesion. Sinuses/Orbits: The visualized paranasal sinuses are essentially clear. The mastoid air cells are unopacified. Other: None. CT CERVICAL SPINE FINDINGS Alignment: Straightening of the cervical spine, likely positional. Skull base and vertebrae: No acute fracture. No primary bone lesion or focal pathologic process. Incomplete fusion of the posterior arch of C1. Soft tissues and spinal canal: No prevertebral fluid or swelling. No visible canal hematoma. Disc levels: Mild degenerative changes at C6-7. Spinal canal is patent. Upper chest: Lung apices are clear. Other: Visualized thyroid is unremarkable. IMPRESSION: No evidence of acute intracranial abnormality. Atrophy with small vessel ischemic changes. No traumatic injury to the cervical spine. Mild degenerative changes. Electronically Signed   By: Julian Hy M.D.   On:  12/01/2021 02:58    Procedures .Marland KitchenLaceration Repair  Date/Time: 12/01/2021 6:01 AM  Performed by: Merryl Hacker, MD Authorized by: Merryl Hacker, MD   Consent:    Consent obtained:  Verbal   Consent given by:  Patient   Risks discussed:  Pain Universal protocol:    Patient identity confirmed:  Verbally with patient Anesthesia:    Anesthesia method:  None Laceration details:    Location:  Scalp   Scalp location:  Occipital   Length (cm):  3   Depth (mm):  2 Exploration:    Limited defect created (wound extended): no     Hemostasis achieved with:  Direct pressure   Contaminated: no   Treatment:    Area cleansed with:  Povidone-iodine   Amount of cleaning:  Standard   Irrigation solution:  Sterile saline   Visualized foreign bodies/material removed: no     Debridement:  None   Scar revision: no   Skin repair:    Repair method:  Staples   Number of staples:  1 Approximation:    Approximation:  Close Repair type:    Repair type:  Simple Post-procedure details:  Dressing:  Open (no dressing)   Procedure completion:  Tolerated     Medications Ordered in ED Medications  lidocaine (XYLOCAINE) 1 % (with pres) injection (has no administration in time range)  cefTRIAXone (ROCEPHIN) injection 1 g (1 g Intramuscular Given 12/01/21 0343)    ED Course/ Medical Decision Making/ A&P Clinical Course as of 12/01/21 1610  Thu Dec 01, 2021  0559 Laceration repaired at the bedside.  Patient states that she does not ambulate.  She has significant dementia.  Unable to get up with her living facility to discuss her baseline ADL status.  1 year ago it was noted that she walked with a walker. [CH]  9604 Patient daughter updated.  She reports that her mother walks with a walker.  Will attempt ambulation.  She was advised of patient's likely discharge. [CH]    Clinical Course User Index [CH] Brielle Moro, Mayer Masker, MD                           Medical Decision Making Amount  and/or Complexity of Data Reviewed Labs: ordered. Radiology: ordered.  Risk Prescription drug management.   This patient presents to the ED for concern of fall, this involves an extensive number of treatment options, and is a complaint that carries with it a high risk of complications and morbidity.  I considered the following differential and admission for this acute, potentially life threatening condition.  The differential diagnosis includes head injury, neck injury, scalp laceration and hematoma, long bone fracture.  MDM:    This is an 88 year old female with a history of dementia who presents from her living facility following an unwitnessed fall.  She has an obvious scalp hematoma and laceration.  She is also complaining of right hip pain.  She is oriented x2.  CT head neck obtained and showed no evidence of acute bleed.  Laceration was repaired at the bedside.  X-ray shows significant osteoporosis of the right hip.  This severely limits assessment for fracture.  CT scan was obtained.  CT does not show any obvious acute fracture.  It is unclear what patient's baseline ambulatory status is.  She states she does not walk but she was found on the floor.  Nursing attempted multiple times to contact facility.  I have low suspicion at this time of occult fracture.  Will discharge back to facility.  She will need staple removal in 10 days.  Of note, urinalysis was sent given unwitnessed fall and shows nitrite positive with large leuk esterase.  Urine culture was sent and she was given IM Rocephin.  Will discharge with Keflex.  EKG without acute ischemic arrhythmic changes  (Labs, imaging, consults)  Labs: I Ordered, and personally interpreted labs.  The pertinent results include: Urinalysis, urine culture  Imaging Studies ordered: I ordered imaging studies including CT head, cervical spine, right hip x-ray and CT I independently visualized and interpreted imaging. I agree with the radiologist  interpretation  Additional history obtained from EMS.  External records from outside source obtained and reviewed including prior evaluations  Cardiac Monitoring: The patient was maintained on a cardiac monitor.  I personally viewed and interpreted the cardiac monitored which showed an underlying rhythm of: Sinus rhythm  Reevaluation: After the interventions noted above, I reevaluated the patient and found that they have :stayed the same  Social Determinants of Health: Lives in living facility  Disposition: Discharge  Co morbidities that complicate the patient evaluation  Past Medical  History:  Diagnosis Date   Arthritis    Cataract    Post herpetic neuralgia 04/17/2011     Medicines Meds ordered this encounter  Medications   cefTRIAXone (ROCEPHIN) injection 1 g    Order Specific Question:   Antibiotic Indication:    Answer:   UTI   lidocaine (XYLOCAINE) 1 % (with pres) injection    Preyer, Lanora Manis An: cabinet override   cephALEXin (KEFLEX) 500 MG capsule    Sig: Take 1 capsule (500 mg total) by mouth 3 (three) times daily.    Dispense:  21 capsule    Refill:  0    I have reviewed the patients home medicines and have made adjustments as needed  Problem List / ED Course: Problem List Items Addressed This Visit   None Visit Diagnoses     Fall, initial encounter    -  Primary   Laceration of scalp, initial encounter       Right hip pain       Acute cystitis without hematuria                       Final Clinical Impression(s) / ED Diagnoses Final diagnoses:  Fall, initial encounter  Laceration of scalp, initial encounter  Right hip pain  Acute cystitis without hematuria    Rx / DC Orders ED Discharge Orders          Ordered    cephALEXin (KEFLEX) 500 MG capsule  3 times daily        12/01/21 0559              Gaetana Kawahara, Mayer Masker, MD 12/01/21 5625    Shon Baton, MD 12/01/21 [phone]

## 2021-12-01 NOTE — ED Notes (Signed)
Pt remains in xray.

## 2021-12-01 NOTE — Discharge Instructions (Addendum)
You were seen today following a fall.  You have a laceration to your scalp.  Staple needs to be removed in 10 days.  You were also found to have a urinary tract infection.  Take antibiotics as prescribed.

## 2021-12-01 NOTE — ED Triage Notes (Addendum)
Pt from [facility]. Fall tonight. Head truama and in C collar. Fall was unwitnessed. Laceration to head- not on blood thinners. She is cognitively at her baseline per staff.

## 2021-12-01 NOTE — ED Notes (Signed)
Pt states she doesn't walk that she just lays in the bed all day. RN made aware.

## 2021-12-01 NOTE — ED Notes (Addendum)
Staff attempted to ambulate patient. She refused and verbally threatened staff if they continued to try to make her. She told them to shut the door and shut up. EDP made aware.

## 2021-12-03 LAB — URINE CULTURE: Culture: >=100000 — AB

## 2021-12-04 ENCOUNTER — Telehealth (HOSPITAL_BASED_OUTPATIENT_CLINIC_OR_DEPARTMENT_OTHER): Payer: Self-pay | Admitting: *Deleted

## 2021-12-04 NOTE — Telephone Encounter (Signed)
Post ED Visit - Positive Culture Follow-up  Culture report reviewed by antimicrobial stewardship pharmacist: [place] Team []  Elenor Quinones, Pharm.D. []  Heide Guile, Pharm.D., BCPS AQ-ID []  Parks Neptune, Pharm.D., BCPS []  Alycia Rossetti, Pharm.D., BCPS []  Gardner, Pharm.D., BCPS, AAHIVP []  Legrand Como, Pharm.D., BCPS, AAHIVP []  Salome Arnt, PharmD, BCPS []  Johnnette Gourd, PharmD, BCPS []  Hughes Better, PharmD, BCPS []  Leeroy Cha, PharmD []  Laqueta Linden, PharmD, BCPS []  Albertina Parr, PharmD  [place] Team []  Leodis Sias, PharmD [x]  Lindell Spar, PharmD []  Royetta Asal, PharmD []  Graylin Shiver, Rph []  Rema Fendt) Glennon Mac, PharmD []  Arlyn Dunning, PharmD []  Netta Cedars, PharmD []  Dia Sitter, PharmD []  Leone Haven, PharmD []  Gretta Arab, PharmD []  Theodis Shove, PharmD []  Peggyann Juba, PharmD []  Reuel Boom, PharmD   Positive urine culture Treated with Cephalexin, organism sensitive to the same and no further patient follow-up is required at this time.  Rosie Fate 12/04/2021, 1:14 PM

## 2021-12-08 ENCOUNTER — Ambulatory Visit (INDEPENDENT_AMBULATORY_CARE_PROVIDER_SITE_OTHER): Payer: Medicare HMO | Admitting: Nurse Practitioner

## 2021-12-08 VITALS — BP 118/60 | HR 82 | Temp 97.4°F | Ht 66.0 in

## 2021-12-08 DIAGNOSIS — Z4802 Encounter for removal of sutures: Secondary | ICD-10-CM

## 2021-12-08 DIAGNOSIS — W19XXXD Unspecified fall, subsequent encounter: Secondary | ICD-10-CM | POA: Diagnosis not present

## 2021-12-08 DIAGNOSIS — S0101XD Laceration without foreign body of scalp, subsequent encounter: Secondary | ICD-10-CM

## 2021-12-08 DIAGNOSIS — Z79899 Other long term (current) drug therapy: Secondary | ICD-10-CM | POA: Diagnosis not present

## 2021-12-08 DIAGNOSIS — N390 Urinary tract infection, site not specified: Secondary | ICD-10-CM | POA: Diagnosis not present

## 2021-12-08 NOTE — Progress Notes (Signed)
I,Tianna Badgett,acting as a Education administrator for [employer], FNP.,have documented all relevant documentation on the behalf of Minette Brine, FNP,as directed by  Minette Brine, FNP while in the presence of Minette Brine, [place].  Subjective:     Patient ID: Amy Whitehead , female    DOB: [birth year] , 88 y.o.   MRN: [id number]   Chief Complaint  Patient presents with   Suture / Staple Removal    HPI  Here to have staples removed. She is being treated for UTI and had a fall. She is here today with [name] RN Nurse Case Management.      Past Medical History:  Diagnosis Date   Arthritis    Cataract    Post herpetic neuralgia 04/17/2011     Family History  Problem Relation Age of Onset   Cancer Father        brain tumor   Cancer Sister        lung   Diabetes Sister      Current Outpatient Medications:    acetaminophen (TYLENOL) 500 MG tablet, Take 500 mg by mouth every 6 (six) hours as needed., Disp: , Rfl:    amLODipine (NORVASC) 5 MG tablet, Take 1 tablet (5 mg total) by mouth daily., Disp: 90 tablet, Rfl: 1   aspirin EC 81 MG tablet, Take 81 mg by mouth daily., Disp: , Rfl:    Calcium-Vitamin D (CALTRATE 600 PLUS-VIT D PO), Take 2 tablets by mouth 2 (two) times daily., Disp: , Rfl:    cephALEXin (KEFLEX) 500 MG capsule, Take 1 capsule (500 mg total) by mouth 3 (three) times daily., Disp: 21 capsule, Rfl: 0   gabapentin (NEURONTIN) 300 MG capsule, TAKE 3 CAPSULES 3 TIMES DAILY (Patient taking differently: Take 900 mg by mouth 3 (three) times daily.), Disp: 270 capsule, Rfl: 3   levothyroxine (SYNTHROID) 100 MCG tablet, Take 1 tablet (100 mcg total) by mouth daily before breakfast., Disp: 30 tablet, Rfl: 5   Multiple Vitamin (MULTIVITAMIN WITH MINERALS) TABS tablet, Take 1 tablet by mouth daily., Disp: , Rfl:    polyethylene glycol powder (MIRALAX) 17 GM/SCOOP powder, Take 17 g by mouth 2 (two) times daily as needed for moderate constipation., Disp: 255 g, Rfl: 0   QUEtiapine  (SEROQUEL) 25 MG tablet, TAKE ONE TABLET AT BEDTIME (Patient taking differently: Take 25 mg by mouth at bedtime.), Disp: 90 tablet, Rfl: 1  Current Facility-Administered Medications:    cefTRIAXone (ROCEPHIN) injection 1 g, 1 g, Intramuscular, Once, Ghumman, Ramandeep, NP   No Known Allergies   Review of Systems  Constitutional: Negative.   Respiratory: Negative.    Cardiovascular: Negative.   Neurological: Negative.  Negative for dizziness.  Psychiatric/Behavioral: Negative.       Today's Vitals   12/08/21 1127  BP: 118/60  Pulse: 82  Temp: (!) 97.4 F (36.3 C)  TempSrc: Oral  Height: 5\' 6"  (1.676 m)   Body mass index is 20.48 kg/m.   Objective:  Physical Exam Vitals reviewed.  Constitutional:      General: She is not in acute distress.    Appearance: Normal appearance.  Pulmonary:     Effort: Pulmonary effort is normal. No respiratory distress.  Skin:    Comments: Removed 1 staples from posterior head, healing well  Neurological:     General: No focal deficit present.     Mental Status: She is alert and oriented to person, place, and time.     Cranial Nerves: No cranial nerve deficit.  Motor: No weakness.  Psychiatric:        Mood and Affect: Mood normal.        Behavior: Behavior normal.        Thought Content: Thought content normal.        Judgment: Judgment normal.         Assessment And Plan:     1. Fall, subsequent encounter Comments: She fell and hit her head approximately 10 days ago. Had a laceration to posterior head with 1 staple   2. Encounter for staple removal Comments: Removed one staple from posterior head, healing well  3. Frequent urinary tract infections Comments: Recent treatment of UTI by facility provider/ER   Discussed she can not have 2 PCPs she is being followed at her [facility] by the providers there. The staff member that was present with her is aware and has made the daughter aware. Also mentioned she may  have a risk for a bill due this. Daughter did not want to wait a few more days to have staples removed due to an upcoming appt per staff member  Patient was given opportunity to ask questions. Patient verbalized understanding of the plan and was able to repeat key elements of the plan. All questions were answered to their satisfaction.  Minette Brine, FNP   I, Minette Brine, FNP, have reviewed all documentation for this visit. The documentation on 12/08/21 for the exam, diagnosis, procedures, and orders are all accurate and complete.   IF YOU HAVE BEEN REFERRED TO A SPECIALIST, IT MAY TAKE 1-2 WEEKS TO SCHEDULE/PROCESS THE REFERRAL. IF YOU HAVE NOT HEARD FROM US/SPECIALIST IN TWO WEEKS, PLEASE GIVE [place] A CALL AT [phone] X [phone].   THE PATIENT IS ENCOURAGED TO PRACTICE SOCIAL DISTANCING DUE TO THE COVID-19 PANDEMIC.

## 2021-12-21 ENCOUNTER — Encounter: Payer: Self-pay | Admitting: Nurse Practitioner

## 2021-12-21 NOTE — Patient Instructions (Signed)
 Wound Closure Removal, Care After The following information offers guidance on how to care for yourself after your stitches (sutures), staples, or adhesive strips have been removed. Your health care provider may also give you more specific instructions. If you have problems or questions, contact your health care provider. What can I expect after the procedure? After your sutures or staples have been removed or your adhesive strips have fallen off, it is common to have: Some discomfort and swelling in the area. Slight redness in the area. Follow these instructions at home: If you have a dressing: Wash your hands with soap and water for at least 20 seconds before and after you change your bandage (dressing). If soap and water are not available, use hand sanitizer. Change your dressing as told by your health care provider. If your dressing becomes wet or dirty, or develops a bad smell, change it as soon as possible. If your dressing sticks to your skin, pour warm, clean water over it until it loosens and can be removed without pulling apart the wound edges. Pat the area dry with a soft, clean towel. Do not rub the wound because that may cause bleeding. Wound care  Check your wound every day for signs of infection. Check for: More redness, swelling, or pain. Fluid or blood. New warmth, a rash, or hardness at the wound site. Pus or a bad smell. Wash your hands with soap and water for at least 20 seconds before and after touching your wound. If soap and water are not available, use hand sanitizer. Keep the wound area dry and clean. Clean and pat the wound dry as told by your health care provider. Apply cream or ointment only as told by your health care provider. If skin glue or adhesive strips were applied after sutures or staples were removed, leave these closures in place until they peel off on their own. If adhesive strip edges start to loosen and curl up, you may trim the loose edges. Do not  remove adhesive strips completely unless your health care provider tells you to do that. Continue to protect the wound from injury. Do not pick at your wound. Picking can cause an infection. Bathing Do not take baths, swim, or use a hot tub until your health care provider approves. Ask your health care provider if you may take showers. Follow these steps for showering: If you have a dressing, remove it before getting into the shower. In the shower, allow soapy water to get on the wound. Avoid scrubbing the wound. When you get out of the shower, dry the wound by patting it with a clean towel. Reapply a dressing over the wound, if needed. Scar care When your wound has completely healed, help decrease the size of your scar by: Wearing sunscreen over the scar or covering it with clothing when you are outside. New scars get sunburned easily, which can make scarring worse. Gently massaging the scarred area. This can decrease scar thickness. General instructions Take over-the-counter and prescription medicines only as told by your health care provider. Keep all follow-up visits. This is important. Contact a health care provider if: You have more redness, swelling, or pain around your wound. You have fluid or blood coming from your wound. You have new warmth, a rash, or hardness at the wound site. You have pus or a bad smell coming from your wound. Your wound opens up. Get help right away if: You have a fever or chills. You have red streaks coming   from your wound. Summary Change your dressing as told by your health care provider. If your dressing becomes wet or dirty, or develops a bad smell, change it as soon as possible. Check your wound every day for signs of infection. Wash your hands with soap and water for 20 seconds before and after touching your wound. This information is not intended to replace advice given to you by your health care provider. Make sure you discuss any questions you  have with your health care provider. Document Revised: 06/08/2020 Document Reviewed: 06/08/2020 Elsevier Patient Education  2023 [employer]  

## 2021-12-23 DIAGNOSIS — I129 Hypertensive chronic kidney disease with stage 1 through stage 4 chronic kidney disease, or unspecified chronic kidney disease: Secondary | ICD-10-CM | POA: Diagnosis not present

## 2021-12-23 DIAGNOSIS — E038 Other specified hypothyroidism: Secondary | ICD-10-CM | POA: Diagnosis not present

## 2022-01-13 DIAGNOSIS — Z1231 Encounter for screening mammogram for malignant neoplasm of breast: Secondary | ICD-10-CM | POA: Diagnosis not present

## 2022-01-15 ENCOUNTER — Emergency Department (HOSPITAL_COMMUNITY): Payer: Medicare HMO

## 2022-01-15 ENCOUNTER — Emergency Department (HOSPITAL_COMMUNITY)
Admission: EM | Admit: 2022-01-15 | Discharge: 2022-01-16 | Disposition: A | Discharge status: Skilled Nursing Facility | Payer: Medicare HMO | Attending: Emergency Medicine | Admitting: Emergency Medicine

## 2022-01-15 DIAGNOSIS — W19XXXA Unspecified fall, initial encounter: Secondary | ICD-10-CM | POA: Diagnosis not present

## 2022-01-15 DIAGNOSIS — I959 Hypotension, unspecified: Secondary | ICD-10-CM | POA: Diagnosis not present

## 2022-01-15 DIAGNOSIS — Z043 Encounter for examination and observation following other accident: Secondary | ICD-10-CM | POA: Diagnosis not present

## 2022-01-15 DIAGNOSIS — W01198A Fall on same level from slipping, tripping and stumbling with subsequent striking against other object, initial encounter: Secondary | ICD-10-CM | POA: Diagnosis not present

## 2022-01-15 DIAGNOSIS — Y92129 Unspecified place in nursing home as the place of occurrence of the external cause: Secondary | ICD-10-CM | POA: Insufficient documentation

## 2022-01-15 DIAGNOSIS — G319 Degenerative disease of nervous system, unspecified: Secondary | ICD-10-CM | POA: Diagnosis not present

## 2022-01-15 DIAGNOSIS — Y9389 Activity, other specified: Secondary | ICD-10-CM | POA: Diagnosis not present

## 2022-01-15 DIAGNOSIS — R102 Pelvic and perineal pain: Secondary | ICD-10-CM | POA: Diagnosis not present

## 2022-01-15 DIAGNOSIS — S0990XA Unspecified injury of head, initial encounter: Secondary | ICD-10-CM | POA: Diagnosis not present

## 2022-01-15 DIAGNOSIS — S0181XA Laceration without foreign body of other part of head, initial encounter: Secondary | ICD-10-CM | POA: Insufficient documentation

## 2022-01-15 DIAGNOSIS — M47812 Spondylosis without myelopathy or radiculopathy, cervical region: Secondary | ICD-10-CM | POA: Diagnosis not present

## 2022-01-15 DIAGNOSIS — S199XXA Unspecified injury of neck, initial encounter: Secondary | ICD-10-CM | POA: Diagnosis not present

## 2022-01-15 DIAGNOSIS — M25551 Pain in right hip: Secondary | ICD-10-CM | POA: Diagnosis not present

## 2022-01-15 DIAGNOSIS — I6523 Occlusion and stenosis of bilateral carotid arteries: Secondary | ICD-10-CM | POA: Diagnosis not present

## 2022-01-15 MED ORDER — LIDOCAINE-EPINEPHRINE (PF) 2 %-1:200000 IJ SOLN
20.0000 mL | Freq: Once | INTRAMUSCULAR | Status: AC
  Administered 2022-01-15: 20 mL
  Filled 2022-01-15: qty 20

## 2022-01-15 NOTE — ED Triage Notes (Addendum)
Pt from [facility] BIB [facility] d/t fall while putting an item on the self in closet, item fell hitting her in the chin, laceration noted, bleeding controlled, Pt reports fell when the item hit her, did hit her head, was able to get up by herself and walk to the BR. Staff found pt in bathroom applying pressure to her chin with rag. EMS reports A&O at baseline, hx of alzheimer. Pt now c/o right hip pain when moving on bed, reports "just started".

## 2022-01-15 NOTE — ED Notes (Signed)
Pt to CT at this time.

## 2022-01-16 ENCOUNTER — Emergency Department (HOSPITAL_COMMUNITY): Payer: Medicare HMO

## 2022-01-16 DIAGNOSIS — S0990XA Unspecified injury of head, initial encounter: Secondary | ICD-10-CM | POA: Diagnosis not present

## 2022-01-16 DIAGNOSIS — I6523 Occlusion and stenosis of bilateral carotid arteries: Secondary | ICD-10-CM | POA: Diagnosis not present

## 2022-01-16 DIAGNOSIS — S0181XA Laceration without foreign body of other part of head, initial encounter: Secondary | ICD-10-CM | POA: Diagnosis not present

## 2022-01-16 DIAGNOSIS — Z7401 Bed confinement status: Secondary | ICD-10-CM | POA: Diagnosis not present

## 2022-01-16 DIAGNOSIS — R5383 Other fatigue: Secondary | ICD-10-CM | POA: Diagnosis not present

## 2022-01-16 DIAGNOSIS — G319 Degenerative disease of nervous system, unspecified: Secondary | ICD-10-CM | POA: Diagnosis not present

## 2022-01-16 NOTE — ED Notes (Signed)
Report called to Tosha at [facility] at [facility] memory care, receiving RN Mickie Bail has agreed to accept TOC once pt has arrived to inpatient unit, all questions and concerns address. AVS reviewed with Tosha, she verbalized understanding.

## 2022-01-16 NOTE — ED Notes (Signed)
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 Madilyn Hook notified.

## 2022-01-16 NOTE — ED Notes (Addendum)
Called [facility], spoke with Jeanelle Malling, advised pt's personal glasses were left at the ER, they are label and in bio bag for someone to pick up at their convinces. Shonte verbalized understanding. Glasses left at charge desk (called at 0710am)

## 2022-01-16 NOTE — ED Notes (Signed)
[facility] called for transportation back to her facility at [facility]

## 2022-01-16 NOTE — ED Notes (Signed)
Pt return from CT, NAD noted, A&O at baseline

## 2022-01-16 NOTE — ED Notes (Signed)
PTAR has arrived to transport pt back to her facility

## 2022-01-16 NOTE — ED Provider Notes (Signed)
[facility]-EMERGENCY DEPT [facility] Emergency Department Provider Note MRN:  [id number]  Arrival date & time: 01/16/22     Chief Complaint   Laceration and Fall   History of Present Illness   Amy Whitehead is a 88 y.o. year-old female presents to the ED with chief complaint of fall.  She is from [facility].  She states that she was getting something off a shelf or cabinet in her home and it fell out of the cabinet hitting her in the face and knocking her to the ground.  She denies LOC.  Denies anticoagulation.  She sustained a small chin laceration.  Patient provides hx.   Review of Systems  Pertinent positive and negative review of systems noted in HPI.    Physical Exam   Vitals:   01/16/22 0128 01/16/22 0204  BP:  (!) 114/42  Pulse:  62  Resp:  14  Temp: 97.9 F (36.6 C)   SpO2:  94%    CONSTITUTIONAL:  well-appearing, NAD NEURO:  Alert and oriented x 3, CN 3-12 grossly intact EYES:  eyes equal and reactive ENT/NECK:  Supple, no stridor, 2.5 cm laceration to inferior chin CARDIO:  normal rate, regular rhythm, appears well-perfused  PULM:  No respiratory distress, CTAB GI/GU:  non-distended,  MSK/SPINE:  No gross deformities, no edema, moves all extremities  SKIN:  no rash, atraumatic   *Additional and/or pertinent findings included in MDM below  Diagnostic and Interventional Summary    EKG Interpretation  Date/Time:    Ventricular Rate:    PR Interval:    QRS Duration:   QT Interval:    QTC Calculation:   R Axis:     Text Interpretation:         Labs Reviewed - No data to display  CT HEAD WO CONTRAST ( )  Final Result    CT Maxillofacial Wo Contrast  Final Result    CT Cervical Spine Wo Contrast  Final Result    DG Chest Port 1 View  Final Result    DG Pelvis Portable  Final Result      Medications  lidocaine-EPINEPHrine (XYLOCAINE W/EPI) 2 %-1:200000 (PF) injection 20 mL (20 mLs Infiltration Given 01/15/22 2306)     Procedures   /  Critical Care .Marland KitchenLaceration Repair  Date/Time: 01/16/2022 2:22 AM  Performed by: Roxy Horseman, PA-C Authorized by: Roxy Horseman, PA-C   Consent:    Consent obtained:  Verbal   Consent given by:  Patient   Risks discussed:  Poor wound healing and poor cosmetic result   Alternatives discussed:  Observation Universal protocol:    Procedure explained and questions answered to patient or proxy's satisfaction: yes     Relevant documents present and verified: yes     Test results available: yes     Imaging studies available: yes     Required blood products, implants, devices, and special equipment available: yes     Site/side marked: yes     Immediately prior to procedure, a time out was called: yes     Patient identity confirmed:  Verbally with patient Laceration details:    Location:  Face   Face location:  Chin   Length (cm):  2.5 Pre-procedure details:    Preparation:  Patient was prepped and draped in usual sterile fashion and imaging obtained to evaluate for foreign bodies Skin repair:    Repair method:  Sutures   Suture size:  5-0   Wound skin closure material used: vicryl.  Suture technique:  Simple interrupted   Number of sutures:  3 Approximation:    Approximation:  Close Repair type:    Repair type:  Simple Post-procedure details:    Dressing:  Open (no dressing)   ED Course and Medical Decision Making  I have reviewed the triage vital signs, the nursing notes, and pertinent available records from the EMR.  Social Determinants Affecting Complexity of Care: Patient has no clinically significant social determinants affecting this chief complaint..   ED Course:    Medical Decision Making Patient here with ground level fall after an object she was attempting to remove from a cabinet struck her in the face.  She has a small lac that will need to be repaired.  Will check imaging.  Able to actively range all extremities.  Stands and takes a few steps, but  then refused to cooperate further.  Hx of sundowner's.  CTs reassuring.  Seen by and discussed with Dr. Madilyn Hook.  Patient appears stable for discharge.  I called and updated the patient's daughter.  Amount and/or Complexity of Data Reviewed Radiology: ordered.  Risk Prescription drug management.     Consultants: No consultations were needed in caring for this patient.   Treatment and Plan: Emergency department workup does not suggest an emergent condition requiring admission or immediate intervention beyond  what has been performed at this time. The patient is safe for discharge and has  been instructed to return immediately for worsening symptoms, change in  symptoms or any other concerns  Patient seen by and discussed with attending physician, Dr. Madilyn Hook, who agrees with discharge plan.  Final Clinical Impressions(s) / ED Diagnoses     ICD-10-CM   1. Injury of head, initial encounter  S09.90XA     2. Chin laceration, initial encounter  W73.71GG       ED Discharge Orders     None         Discharge Instructions Discussed with and Provided to Patient:   Discharge Instructions   None      Roxy Horseman, PA-C 01/16/22 Roxanna Mew, MD 01/16/22 [phone]

## 2022-01-23 DIAGNOSIS — I129 Hypertensive chronic kidney disease with stage 1 through stage 4 chronic kidney disease, or unspecified chronic kidney disease: Secondary | ICD-10-CM | POA: Diagnosis not present

## 2022-01-23 DIAGNOSIS — E038 Other specified hypothyroidism: Secondary | ICD-10-CM | POA: Diagnosis not present

## 2022-02-01 DIAGNOSIS — L84 Corns and callosities: Secondary | ICD-10-CM | POA: Diagnosis not present

## 2022-02-01 DIAGNOSIS — B351 Tinea unguium: Secondary | ICD-10-CM | POA: Diagnosis not present

## 2022-02-01 DIAGNOSIS — M2041 Other hammer toe(s) (acquired), right foot: Secondary | ICD-10-CM | POA: Diagnosis not present

## 2022-02-10 DIAGNOSIS — N6489 Other specified disorders of breast: Secondary | ICD-10-CM | POA: Diagnosis not present

## 2022-02-10 DIAGNOSIS — R92331 Mammographic heterogeneous density, right breast: Secondary | ICD-10-CM | POA: Diagnosis not present

## 2022-02-24 DIAGNOSIS — R4182 Altered mental status, unspecified: Secondary | ICD-10-CM | POA: Diagnosis not present

## 2022-02-24 DIAGNOSIS — I129 Hypertensive chronic kidney disease with stage 1 through stage 4 chronic kidney disease, or unspecified chronic kidney disease: Secondary | ICD-10-CM | POA: Diagnosis not present

## 2022-02-24 DIAGNOSIS — F0390 Unspecified dementia without behavioral disturbance: Secondary | ICD-10-CM | POA: Diagnosis not present

## 2022-03-06 DIAGNOSIS — M5459 Other low back pain: Secondary | ICD-10-CM | POA: Diagnosis not present

## 2022-03-06 DIAGNOSIS — R2689 Other abnormalities of gait and mobility: Secondary | ICD-10-CM | POA: Diagnosis not present

## 2022-03-06 DIAGNOSIS — M21371 Foot drop, right foot: Secondary | ICD-10-CM | POA: Diagnosis not present

## 2022-03-06 DIAGNOSIS — R278 Other lack of coordination: Secondary | ICD-10-CM | POA: Diagnosis not present

## 2022-03-06 DIAGNOSIS — M6281 Muscle weakness (generalized): Secondary | ICD-10-CM | POA: Diagnosis not present

## 2022-03-07 DIAGNOSIS — M5459 Other low back pain: Secondary | ICD-10-CM | POA: Diagnosis not present

## 2022-03-07 DIAGNOSIS — R2689 Other abnormalities of gait and mobility: Secondary | ICD-10-CM | POA: Diagnosis not present

## 2022-03-07 DIAGNOSIS — M21371 Foot drop, right foot: Secondary | ICD-10-CM | POA: Diagnosis not present

## 2022-03-07 DIAGNOSIS — R278 Other lack of coordination: Secondary | ICD-10-CM | POA: Diagnosis not present

## 2022-03-07 DIAGNOSIS — M6281 Muscle weakness (generalized): Secondary | ICD-10-CM | POA: Diagnosis not present

## 2022-03-08 DIAGNOSIS — M5459 Other low back pain: Secondary | ICD-10-CM | POA: Diagnosis not present

## 2022-03-08 DIAGNOSIS — R2689 Other abnormalities of gait and mobility: Secondary | ICD-10-CM | POA: Diagnosis not present

## 2022-03-08 DIAGNOSIS — R278 Other lack of coordination: Secondary | ICD-10-CM | POA: Diagnosis not present

## 2022-03-08 DIAGNOSIS — M21371 Foot drop, right foot: Secondary | ICD-10-CM | POA: Diagnosis not present

## 2022-03-08 DIAGNOSIS — M6281 Muscle weakness (generalized): Secondary | ICD-10-CM | POA: Diagnosis not present

## 2022-03-10 DIAGNOSIS — M6281 Muscle weakness (generalized): Secondary | ICD-10-CM | POA: Diagnosis not present

## 2022-03-10 DIAGNOSIS — R278 Other lack of coordination: Secondary | ICD-10-CM | POA: Diagnosis not present

## 2022-03-10 DIAGNOSIS — R488 Other symbolic dysfunctions: Secondary | ICD-10-CM | POA: Diagnosis not present

## 2022-03-10 DIAGNOSIS — R2689 Other abnormalities of gait and mobility: Secondary | ICD-10-CM | POA: Diagnosis not present

## 2022-03-13 DIAGNOSIS — R278 Other lack of coordination: Secondary | ICD-10-CM | POA: Diagnosis not present

## 2022-03-13 DIAGNOSIS — R2689 Other abnormalities of gait and mobility: Secondary | ICD-10-CM | POA: Diagnosis not present

## 2022-03-13 DIAGNOSIS — M5459 Other low back pain: Secondary | ICD-10-CM | POA: Diagnosis not present

## 2022-03-13 DIAGNOSIS — M6281 Muscle weakness (generalized): Secondary | ICD-10-CM | POA: Diagnosis not present

## 2022-03-13 DIAGNOSIS — M21371 Foot drop, right foot: Secondary | ICD-10-CM | POA: Diagnosis not present

## 2022-03-14 DIAGNOSIS — R2689 Other abnormalities of gait and mobility: Secondary | ICD-10-CM | POA: Diagnosis not present

## 2022-03-14 DIAGNOSIS — M6281 Muscle weakness (generalized): Secondary | ICD-10-CM | POA: Diagnosis not present

## 2022-03-14 DIAGNOSIS — R278 Other lack of coordination: Secondary | ICD-10-CM | POA: Diagnosis not present

## 2022-03-14 DIAGNOSIS — R488 Other symbolic dysfunctions: Secondary | ICD-10-CM | POA: Diagnosis not present

## 2022-03-15 DIAGNOSIS — M21371 Foot drop, right foot: Secondary | ICD-10-CM | POA: Diagnosis not present

## 2022-03-15 DIAGNOSIS — M6281 Muscle weakness (generalized): Secondary | ICD-10-CM | POA: Diagnosis not present

## 2022-03-15 DIAGNOSIS — R2689 Other abnormalities of gait and mobility: Secondary | ICD-10-CM | POA: Diagnosis not present

## 2022-03-15 DIAGNOSIS — M5459 Other low back pain: Secondary | ICD-10-CM | POA: Diagnosis not present

## 2022-03-15 DIAGNOSIS — R278 Other lack of coordination: Secondary | ICD-10-CM | POA: Diagnosis not present

## 2022-03-15 DIAGNOSIS — R488 Other symbolic dysfunctions: Secondary | ICD-10-CM | POA: Diagnosis not present

## 2022-03-16 DIAGNOSIS — R488 Other symbolic dysfunctions: Secondary | ICD-10-CM | POA: Diagnosis not present

## 2022-03-16 DIAGNOSIS — M5459 Other low back pain: Secondary | ICD-10-CM | POA: Diagnosis not present

## 2022-03-16 DIAGNOSIS — R2689 Other abnormalities of gait and mobility: Secondary | ICD-10-CM | POA: Diagnosis not present

## 2022-03-16 DIAGNOSIS — M6281 Muscle weakness (generalized): Secondary | ICD-10-CM | POA: Diagnosis not present

## 2022-03-16 DIAGNOSIS — R278 Other lack of coordination: Secondary | ICD-10-CM | POA: Diagnosis not present

## 2022-03-16 DIAGNOSIS — M21371 Foot drop, right foot: Secondary | ICD-10-CM | POA: Diagnosis not present

## 2022-03-17 DIAGNOSIS — G301 Alzheimer's disease with late onset: Secondary | ICD-10-CM | POA: Diagnosis not present

## 2022-03-17 DIAGNOSIS — F02B3 Dementia in other diseases classified elsewhere, moderate, with mood disturbance: Secondary | ICD-10-CM | POA: Diagnosis not present

## 2022-03-17 DIAGNOSIS — M21371 Foot drop, right foot: Secondary | ICD-10-CM | POA: Diagnosis not present

## 2022-03-17 DIAGNOSIS — R278 Other lack of coordination: Secondary | ICD-10-CM | POA: Diagnosis not present

## 2022-03-17 DIAGNOSIS — M6281 Muscle weakness (generalized): Secondary | ICD-10-CM | POA: Diagnosis not present

## 2022-03-17 DIAGNOSIS — N3000 Acute cystitis without hematuria: Secondary | ICD-10-CM | POA: Diagnosis not present

## 2022-03-17 DIAGNOSIS — M5459 Other low back pain: Secondary | ICD-10-CM | POA: Diagnosis not present

## 2022-03-17 DIAGNOSIS — R2689 Other abnormalities of gait and mobility: Secondary | ICD-10-CM | POA: Diagnosis not present

## 2022-03-20 DIAGNOSIS — R488 Other symbolic dysfunctions: Secondary | ICD-10-CM | POA: Diagnosis not present

## 2022-03-20 DIAGNOSIS — F02B3 Dementia in other diseases classified elsewhere, moderate, with mood disturbance: Secondary | ICD-10-CM | POA: Diagnosis not present

## 2022-03-20 DIAGNOSIS — R278 Other lack of coordination: Secondary | ICD-10-CM | POA: Diagnosis not present

## 2022-03-20 DIAGNOSIS — R2689 Other abnormalities of gait and mobility: Secondary | ICD-10-CM | POA: Diagnosis not present

## 2022-03-20 DIAGNOSIS — M6281 Muscle weakness (generalized): Secondary | ICD-10-CM | POA: Diagnosis not present

## 2022-03-20 DIAGNOSIS — F0393 Unspecified dementia, unspecified severity, with mood disturbance: Secondary | ICD-10-CM | POA: Diagnosis not present

## 2022-03-20 DIAGNOSIS — Z87440 Personal history of urinary (tract) infections: Secondary | ICD-10-CM | POA: Diagnosis not present

## 2022-03-20 DIAGNOSIS — I129 Hypertensive chronic kidney disease with stage 1 through stage 4 chronic kidney disease, or unspecified chronic kidney disease: Secondary | ICD-10-CM | POA: Diagnosis not present

## 2022-03-21 DIAGNOSIS — N3000 Acute cystitis without hematuria: Secondary | ICD-10-CM | POA: Diagnosis not present

## 2022-03-21 DIAGNOSIS — R400 Somnolence: Secondary | ICD-10-CM | POA: Diagnosis not present

## 2022-03-21 DIAGNOSIS — M21371 Foot drop, right foot: Secondary | ICD-10-CM | POA: Diagnosis not present

## 2022-03-21 DIAGNOSIS — R2689 Other abnormalities of gait and mobility: Secondary | ICD-10-CM | POA: Diagnosis not present

## 2022-03-21 DIAGNOSIS — M5459 Other low back pain: Secondary | ICD-10-CM | POA: Diagnosis not present

## 2022-03-21 DIAGNOSIS — M6281 Muscle weakness (generalized): Secondary | ICD-10-CM | POA: Diagnosis not present

## 2022-03-21 DIAGNOSIS — R278 Other lack of coordination: Secondary | ICD-10-CM | POA: Diagnosis not present

## 2022-03-22 DIAGNOSIS — R278 Other lack of coordination: Secondary | ICD-10-CM | POA: Diagnosis not present

## 2022-03-22 DIAGNOSIS — I82451 Acute embolism and thrombosis of right peroneal vein: Secondary | ICD-10-CM | POA: Diagnosis not present

## 2022-03-22 DIAGNOSIS — I82441 Acute embolism and thrombosis of right tibial vein: Secondary | ICD-10-CM | POA: Diagnosis not present

## 2022-03-22 DIAGNOSIS — M6281 Muscle weakness (generalized): Secondary | ICD-10-CM | POA: Diagnosis not present

## 2022-03-22 DIAGNOSIS — I82411 Acute embolism and thrombosis of right femoral vein: Secondary | ICD-10-CM | POA: Diagnosis not present

## 2022-03-22 DIAGNOSIS — R2689 Other abnormalities of gait and mobility: Secondary | ICD-10-CM | POA: Diagnosis not present

## 2022-03-22 DIAGNOSIS — I82431 Acute embolism and thrombosis of right popliteal vein: Secondary | ICD-10-CM | POA: Diagnosis not present

## 2022-03-22 DIAGNOSIS — R488 Other symbolic dysfunctions: Secondary | ICD-10-CM | POA: Diagnosis not present

## 2022-03-23 DIAGNOSIS — M5459 Other low back pain: Secondary | ICD-10-CM | POA: Diagnosis not present

## 2022-03-23 DIAGNOSIS — M6281 Muscle weakness (generalized): Secondary | ICD-10-CM | POA: Diagnosis not present

## 2022-03-23 DIAGNOSIS — R278 Other lack of coordination: Secondary | ICD-10-CM | POA: Diagnosis not present

## 2022-03-23 DIAGNOSIS — R2689 Other abnormalities of gait and mobility: Secondary | ICD-10-CM | POA: Diagnosis not present

## 2022-03-23 DIAGNOSIS — M21371 Foot drop, right foot: Secondary | ICD-10-CM | POA: Diagnosis not present

## 2022-03-24 DIAGNOSIS — R278 Other lack of coordination: Secondary | ICD-10-CM | POA: Diagnosis not present

## 2022-03-24 DIAGNOSIS — M5459 Other low back pain: Secondary | ICD-10-CM | POA: Diagnosis not present

## 2022-03-24 DIAGNOSIS — R488 Other symbolic dysfunctions: Secondary | ICD-10-CM | POA: Diagnosis not present

## 2022-03-24 DIAGNOSIS — R2689 Other abnormalities of gait and mobility: Secondary | ICD-10-CM | POA: Diagnosis not present

## 2022-03-24 DIAGNOSIS — M21371 Foot drop, right foot: Secondary | ICD-10-CM | POA: Diagnosis not present

## 2022-03-24 DIAGNOSIS — M6281 Muscle weakness (generalized): Secondary | ICD-10-CM | POA: Diagnosis not present

## 2022-03-27 DIAGNOSIS — R488 Other symbolic dysfunctions: Secondary | ICD-10-CM | POA: Diagnosis not present

## 2022-03-27 DIAGNOSIS — I129 Hypertensive chronic kidney disease with stage 1 through stage 4 chronic kidney disease, or unspecified chronic kidney disease: Secondary | ICD-10-CM | POA: Diagnosis not present

## 2022-03-27 DIAGNOSIS — I82401 Acute embolism and thrombosis of unspecified deep veins of right lower extremity: Secondary | ICD-10-CM | POA: Diagnosis not present

## 2022-03-27 DIAGNOSIS — R278 Other lack of coordination: Secondary | ICD-10-CM | POA: Diagnosis not present

## 2022-03-27 DIAGNOSIS — R2689 Other abnormalities of gait and mobility: Secondary | ICD-10-CM | POA: Diagnosis not present

## 2022-03-27 DIAGNOSIS — N1831 Chronic kidney disease, stage 3a: Secondary | ICD-10-CM | POA: Diagnosis not present

## 2022-03-27 DIAGNOSIS — F0390 Unspecified dementia without behavioral disturbance: Secondary | ICD-10-CM | POA: Diagnosis not present

## 2022-03-27 DIAGNOSIS — M6281 Muscle weakness (generalized): Secondary | ICD-10-CM | POA: Diagnosis not present

## 2022-03-28 DIAGNOSIS — M6281 Muscle weakness (generalized): Secondary | ICD-10-CM | POA: Diagnosis not present

## 2022-03-28 DIAGNOSIS — M5459 Other low back pain: Secondary | ICD-10-CM | POA: Diagnosis not present

## 2022-03-28 DIAGNOSIS — M21371 Foot drop, right foot: Secondary | ICD-10-CM | POA: Diagnosis not present

## 2022-03-28 DIAGNOSIS — R2689 Other abnormalities of gait and mobility: Secondary | ICD-10-CM | POA: Diagnosis not present

## 2022-03-28 DIAGNOSIS — R278 Other lack of coordination: Secondary | ICD-10-CM | POA: Diagnosis not present

## 2022-03-29 DIAGNOSIS — R488 Other symbolic dysfunctions: Secondary | ICD-10-CM | POA: Diagnosis not present

## 2022-03-29 DIAGNOSIS — M6281 Muscle weakness (generalized): Secondary | ICD-10-CM | POA: Diagnosis not present

## 2022-03-29 DIAGNOSIS — F0390 Unspecified dementia without behavioral disturbance: Secondary | ICD-10-CM | POA: Diagnosis not present

## 2022-03-29 DIAGNOSIS — R278 Other lack of coordination: Secondary | ICD-10-CM | POA: Diagnosis not present

## 2022-03-29 DIAGNOSIS — R2689 Other abnormalities of gait and mobility: Secondary | ICD-10-CM | POA: Diagnosis not present

## 2022-03-29 DIAGNOSIS — E038 Other specified hypothyroidism: Secondary | ICD-10-CM | POA: Diagnosis not present

## 2022-07-22 IMAGING — CT CT CERVICAL SPINE W/O CM
3 of 4 series · 11 of 33 positions shown, 13 images · non-contrast
Comparison: CT head 01/11/2020

CLINICAL DATA: Unwitnessed and un remembered fall. Hematoma and
laceration to the posterior right side of head. Cervical collar.

EXAM:
CT HEAD WITHOUT CONTRAST
CT CERVICAL SPINE WITHOUT CONTRAST
TECHNIQUE: Multidetector CT imaging of the head and cervical spine was
performed following the standard protocol without intravenous
contrast. Multiplanar CT image reconstructions of the cervical spine
were also generated.

[Series 6: orthogonal axial st · axial · 0.21mm/px · z∈[-262,-147]mm · 3 of 92 slices shown, 4 images]
[im 16/92  soft-tissue]
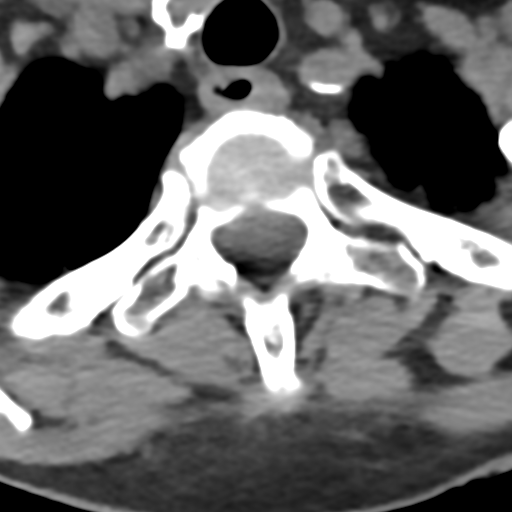
[im 16/92  bone]
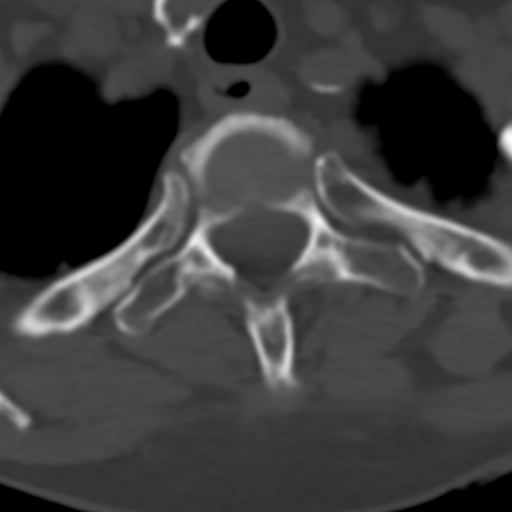
[im 46/92  bone]
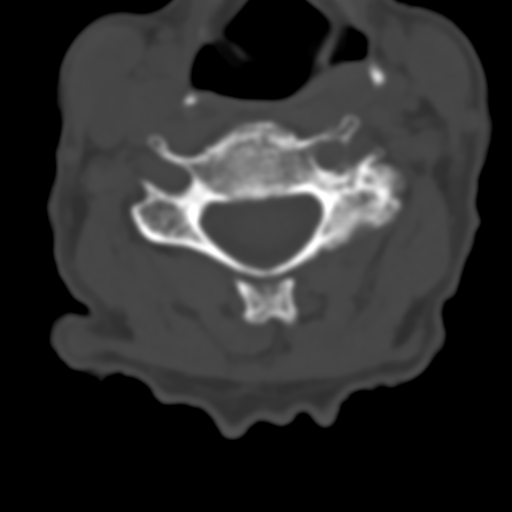
[im 76/92  bone]
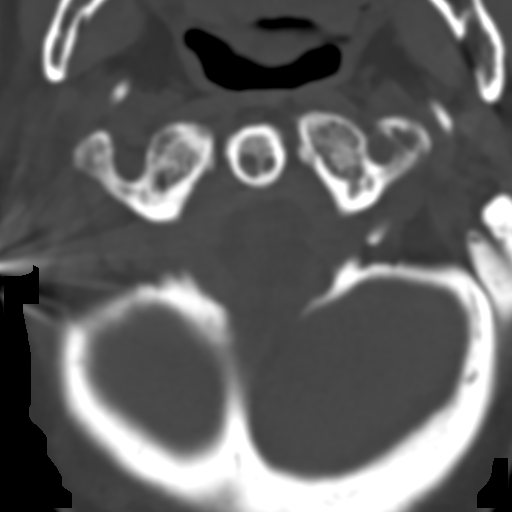

[Series 9: coronal bone · coronal · 0.21mm/px · 3 of 53 slices shown]
[im 11/53  bone]
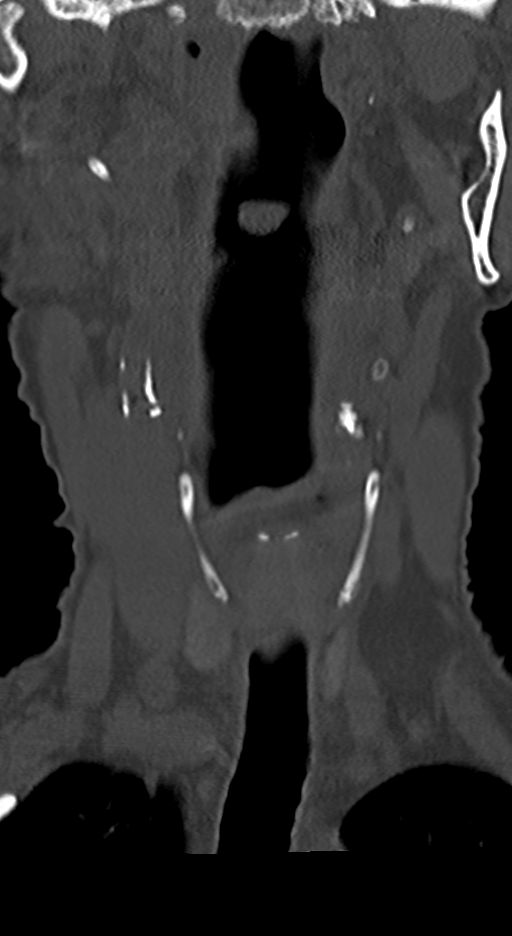
[im 21/53  bone]
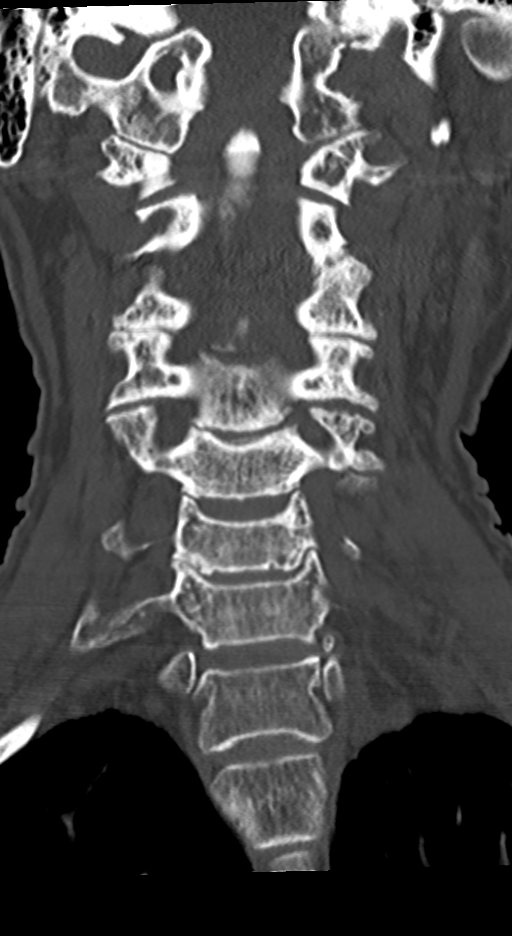
[im 32/53  bone]
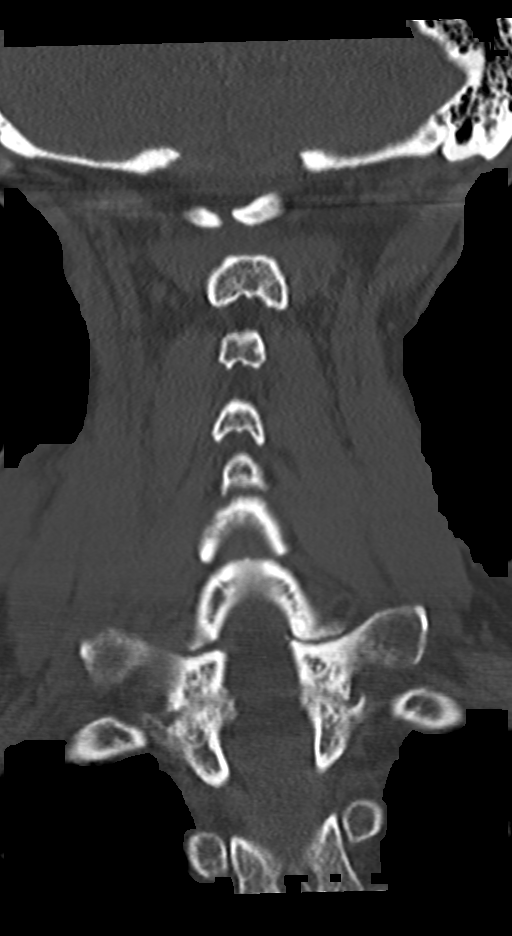

[Series 10: sagittal bone · sagittal · 0.22mm/px · 5 of 55 slices shown, 6 images]
[im 19/55  bone]
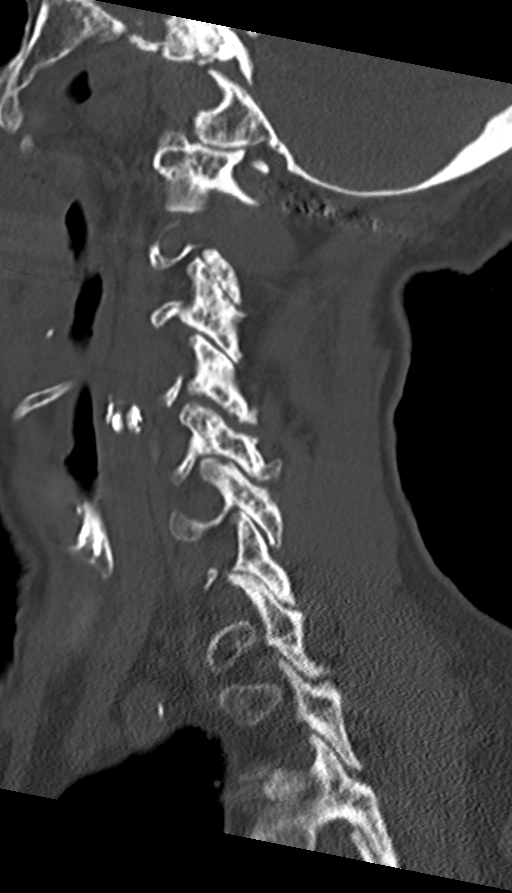
[im 23/55  bone]
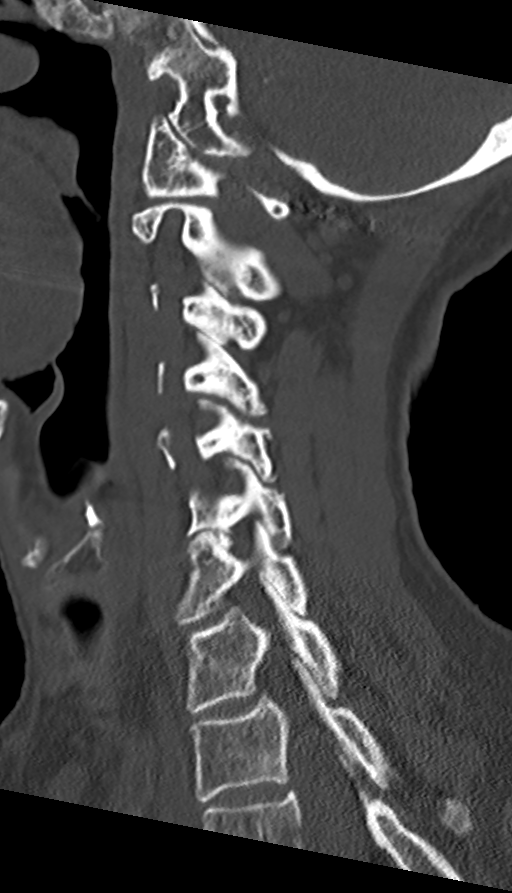
[im 28/55  soft-tissue]
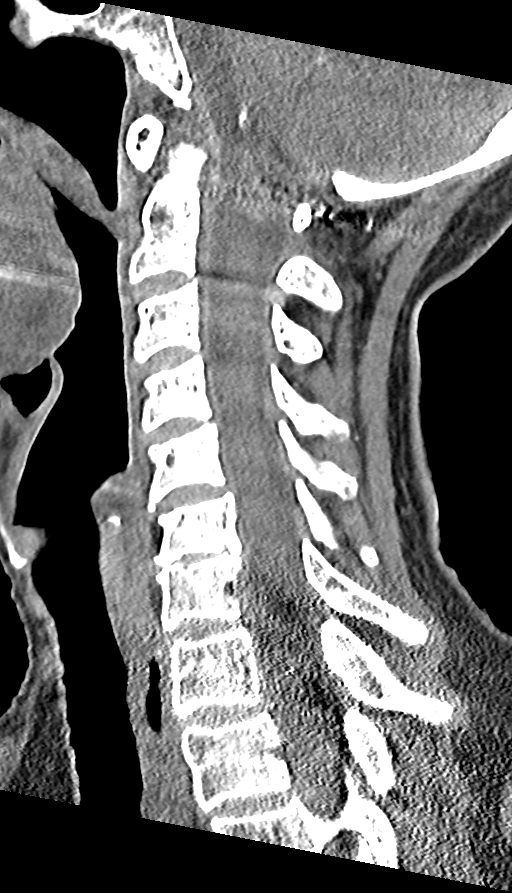
[im 28/55  bone]
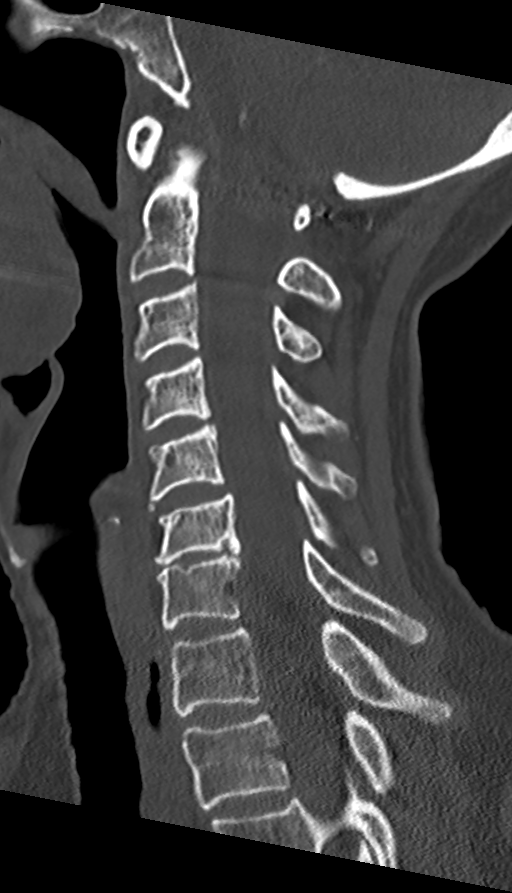
[im 32/55  bone]
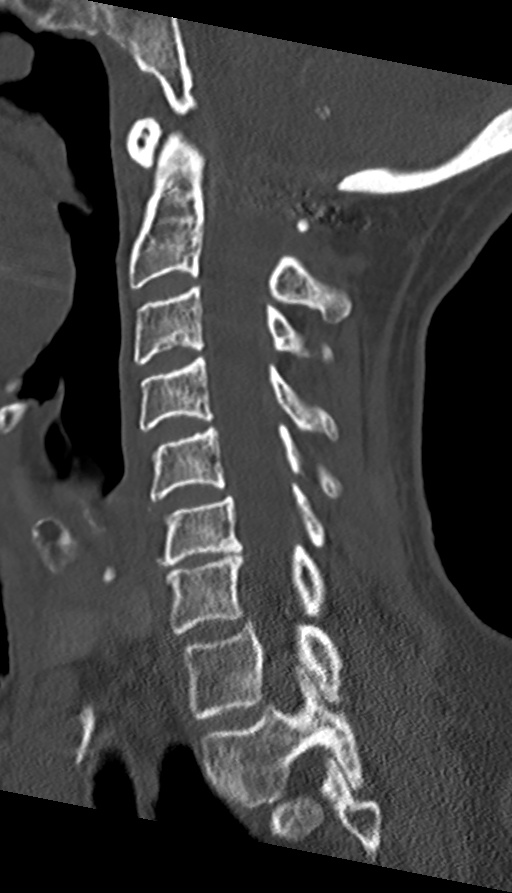
[im 37/55  bone]
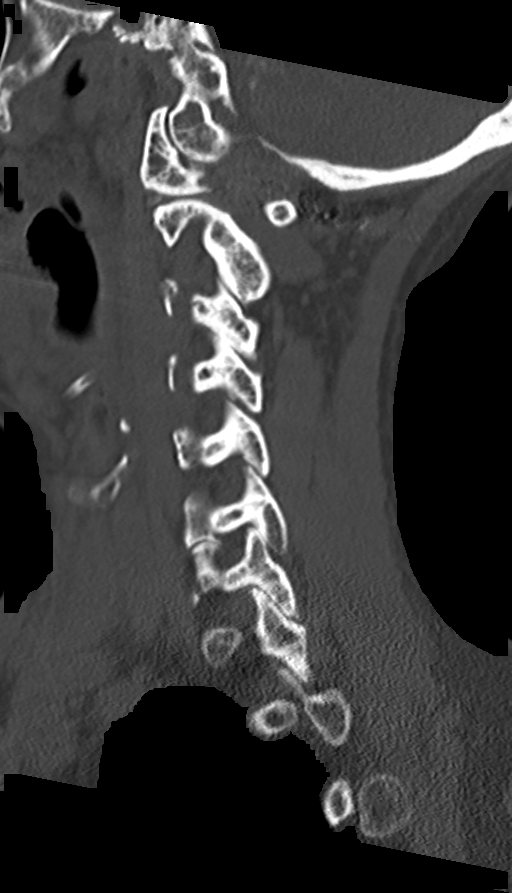

[11 of 33 positions shown; findings below may reference images not displayed]

FINDINGS: CT HEAD FINDINGS

Brain: No evidence of acute infarction, hemorrhage, hydrocephalus,
extra-axial collection or mass lesion/mass effect. Diffuse cerebral
atrophy. Low-attenuation changes in the deep white matter consistent
with small vessel ischemia.

Vascular: Moderate intracranial arterial vascular calcifications.

Skull: Calvarium appears intact.

Sinuses/Orbits: Paranasal sinuses and mastoid air cells are clear.

Other: Moderate subcutaneous scalp hematoma over the right posterior
parietal region.

CT CERVICAL SPINE FINDINGS

Alignment: Normal.

Skull base and vertebrae: No acute fracture. No primary bone lesion
or focal pathologic process.

Soft tissues and spinal canal: No prevertebral fluid or swelling. No
visible canal hematoma.

Disc levels: Degenerative changes with disc space narrowing and
endplate osteophyte formation throughout. Most prominent in the
lower cervical region. Degenerative changes in the facet joints.

Upper chest: Visualized lung apices are clear.

Other: None.
IMPRESSION: 1. No acute intracranial abnormalities.
2. Degenerative changes in the cervical spine. Normal alignment. No
acute displaced fractures identified.

## 2022-10-29 DEATH — deceased
